# Patient Record
Sex: Female | Born: 1935
Health system: Southern US, Community
[De-identification: ages and names within clinical notes are randomized; demographics above are authoritative.]

## PROBLEM LIST (undated history)

## (undated) DIAGNOSIS — M810 Age-related osteoporosis without current pathological fracture: Secondary | ICD-10-CM

## (undated) DIAGNOSIS — M1990 Unspecified osteoarthritis, unspecified site: Secondary | ICD-10-CM

## (undated) DIAGNOSIS — I10 Essential (primary) hypertension: Secondary | ICD-10-CM

## (undated) DIAGNOSIS — Z960 Presence of urogenital implants: Secondary | ICD-10-CM

## (undated) DIAGNOSIS — F32A Depression, unspecified: Secondary | ICD-10-CM

## (undated) DIAGNOSIS — I509 Heart failure, unspecified: Secondary | ICD-10-CM

## (undated) DIAGNOSIS — I499 Cardiac arrhythmia, unspecified: Secondary | ICD-10-CM

## (undated) DIAGNOSIS — Z8739 Personal history of other diseases of the musculoskeletal system and connective tissue: Secondary | ICD-10-CM

## (undated) DIAGNOSIS — IMO0001 Reserved for inherently not codable concepts without codable children: Secondary | ICD-10-CM

## (undated) DIAGNOSIS — F329 Major depressive disorder, single episode, unspecified: Secondary | ICD-10-CM

## (undated) DIAGNOSIS — M069 Rheumatoid arthritis, unspecified: Secondary | ICD-10-CM

## (undated) DIAGNOSIS — C50919 Malignant neoplasm of unspecified site of unspecified female breast: Secondary | ICD-10-CM

## (undated) DIAGNOSIS — N182 Chronic kidney disease, stage 2 (mild): Secondary | ICD-10-CM

## (undated) DIAGNOSIS — K8020 Calculus of gallbladder without cholecystitis without obstruction: Secondary | ICD-10-CM

## (undated) DIAGNOSIS — I639 Cerebral infarction, unspecified: Secondary | ICD-10-CM

## (undated) DIAGNOSIS — N8189 Other female genital prolapse: Secondary | ICD-10-CM

## (undated) DIAGNOSIS — E119 Type 2 diabetes mellitus without complications: Secondary | ICD-10-CM

## (undated) DIAGNOSIS — G459 Transient cerebral ischemic attack, unspecified: Secondary | ICD-10-CM

## (undated) DIAGNOSIS — I5022 Chronic systolic (congestive) heart failure: Secondary | ICD-10-CM

## (undated) DIAGNOSIS — I4891 Unspecified atrial fibrillation: Secondary | ICD-10-CM

## (undated) DIAGNOSIS — H9190 Unspecified hearing loss, unspecified ear: Secondary | ICD-10-CM

## (undated) DIAGNOSIS — E559 Vitamin D deficiency, unspecified: Secondary | ICD-10-CM

## (undated) HISTORY — DX: Transient cerebral ischemic attack, unspecified: G45.9

## (undated) HISTORY — DX: Type 2 diabetes mellitus without complications: E11.9

## (undated) HISTORY — PX: REPLACEMENT TOTAL KNEE: SUR1224

## (undated) HISTORY — DX: Personal history of other diseases of the musculoskeletal system and connective tissue: Z87.39

## (undated) HISTORY — DX: Chronic kidney disease, stage 2 (mild): N18.2

## (undated) HISTORY — PX: CATARACT EXTRACTION W/ INTRAOCULAR LENS  IMPLANT, BILATERAL: SHX1307

## (undated) HISTORY — DX: Rheumatoid arthritis, unspecified: M06.9

## (undated) HISTORY — DX: Vitamin D deficiency, unspecified: E55.9

## (undated) HISTORY — DX: Presence of urogenital implants: Z96.0

## (undated) HISTORY — DX: Age-related osteoporosis without current pathological fracture: M81.0

## (undated) HISTORY — DX: Major depressive disorder, single episode, unspecified: F32.9

## (undated) HISTORY — PX: BREAST LUMPECTOMY: SHX2

## (undated) HISTORY — DX: Calculus of gallbladder without cholecystitis without obstruction: K80.20

## (undated) HISTORY — DX: Other female genital prolapse: N81.89

---

## 1997-10-21 ENCOUNTER — Other Ambulatory Visit: Admission: RE | Admit: 1997-10-21 | Discharge: 1997-10-21 | Payer: Self-pay | Admitting: Obstetrics and Gynecology

## 1999-03-20 ENCOUNTER — Other Ambulatory Visit: Admission: RE | Admit: 1999-03-20 | Discharge: 1999-03-20 | Payer: Self-pay | Admitting: Obstetrics and Gynecology

## 1999-12-16 ENCOUNTER — Ambulatory Visit (HOSPITAL_COMMUNITY): Admission: RE | Admit: 1999-12-16 | Discharge: 1999-12-16 | Payer: Self-pay | Admitting: *Deleted

## 2000-04-01 ENCOUNTER — Other Ambulatory Visit: Admission: RE | Admit: 2000-04-01 | Discharge: 2000-04-01 | Payer: Self-pay | Admitting: Obstetrics and Gynecology

## 2000-07-22 ENCOUNTER — Encounter: Admission: RE | Admit: 2000-07-22 | Discharge: 2000-07-22 | Payer: Self-pay | Admitting: Rheumatology

## 2000-07-22 ENCOUNTER — Encounter: Payer: Self-pay | Admitting: Rheumatology

## 2001-07-28 ENCOUNTER — Other Ambulatory Visit: Admission: RE | Admit: 2001-07-28 | Discharge: 2001-07-28 | Payer: Self-pay | Admitting: Obstetrics and Gynecology

## 2001-10-01 ENCOUNTER — Encounter: Payer: Self-pay | Admitting: Emergency Medicine

## 2001-10-01 ENCOUNTER — Emergency Department (HOSPITAL_COMMUNITY): Admission: EM | Admit: 2001-10-01 | Discharge: 2001-10-01 | Payer: Self-pay | Admitting: Emergency Medicine

## 2003-06-17 ENCOUNTER — Emergency Department (HOSPITAL_COMMUNITY): Admission: EM | Admit: 2003-06-17 | Discharge: 2003-06-17 | Payer: Self-pay | Admitting: Emergency Medicine

## 2004-09-05 ENCOUNTER — Emergency Department (HOSPITAL_COMMUNITY): Admission: EM | Admit: 2004-09-05 | Discharge: 2004-09-05 | Payer: Self-pay | Admitting: Emergency Medicine

## 2004-11-24 ENCOUNTER — Encounter: Admission: RE | Admit: 2004-11-24 | Discharge: 2004-11-24 | Payer: Self-pay | Admitting: Internal Medicine

## 2004-12-01 ENCOUNTER — Other Ambulatory Visit: Admission: RE | Admit: 2004-12-01 | Discharge: 2004-12-01 | Payer: Self-pay | Admitting: Obstetrics and Gynecology

## 2005-12-07 ENCOUNTER — Other Ambulatory Visit: Admission: RE | Admit: 2005-12-07 | Discharge: 2005-12-07 | Payer: Self-pay | Admitting: Obstetrics and Gynecology

## 2006-03-01 ENCOUNTER — Encounter: Admission: RE | Admit: 2006-03-01 | Discharge: 2006-03-01 | Payer: Self-pay | Admitting: Obstetrics and Gynecology

## 2006-03-07 ENCOUNTER — Encounter: Admission: RE | Admit: 2006-03-07 | Discharge: 2006-03-07 | Payer: Self-pay | Admitting: Obstetrics and Gynecology

## 2006-06-03 ENCOUNTER — Encounter: Admission: RE | Admit: 2006-06-03 | Discharge: 2006-06-03 | Payer: Self-pay | Admitting: Cardiology

## 2006-06-08 IMAGING — CR DG ELBOW COMPLETE 3+V*L*
4 series · 4 of 4 positions shown · non-contrast
Comparison: None.

CLINICAL DATA: Status post fall now with large swollen and bruised area around the elbow.  
 LEFT ELBOW ? 3 VIEWS:

[w elbow joint lat left *]
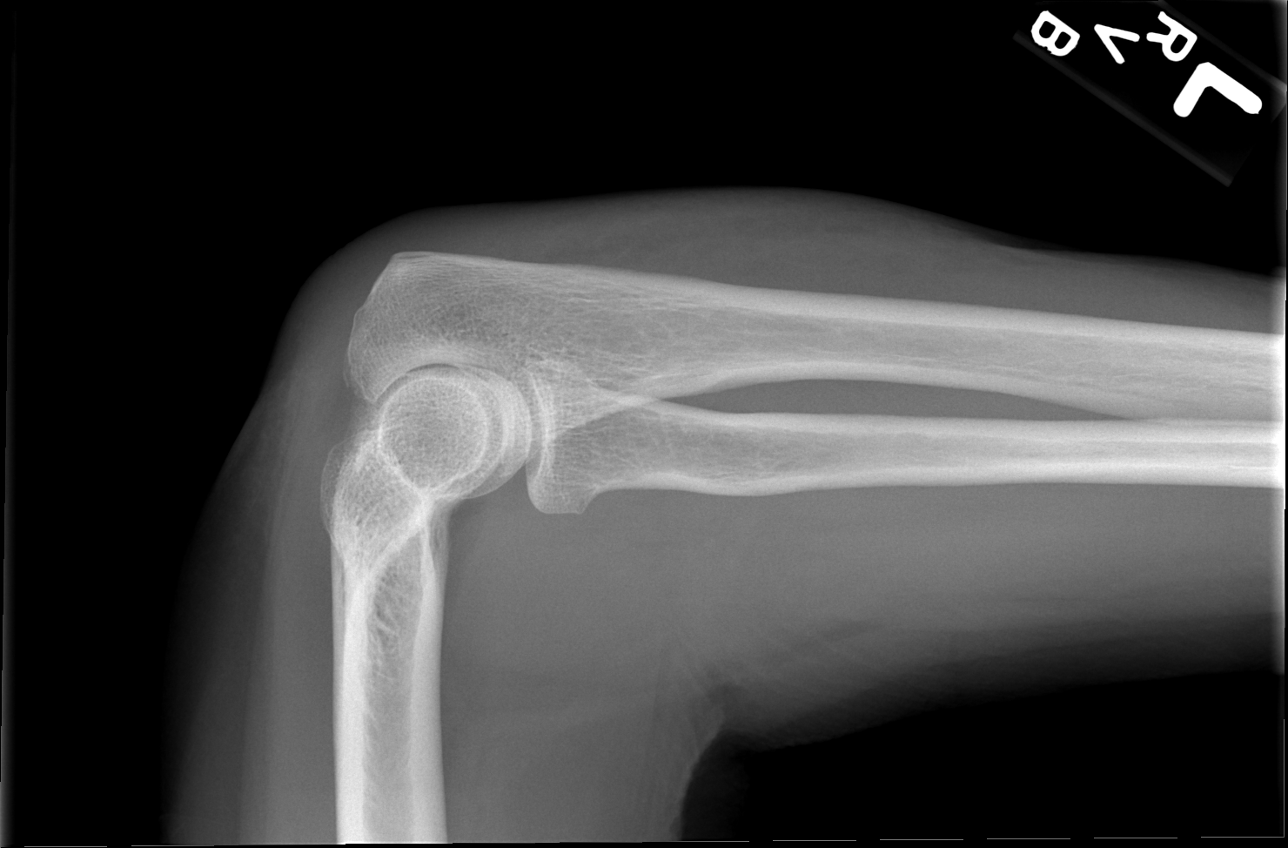

[w elbow joint a.p. left]
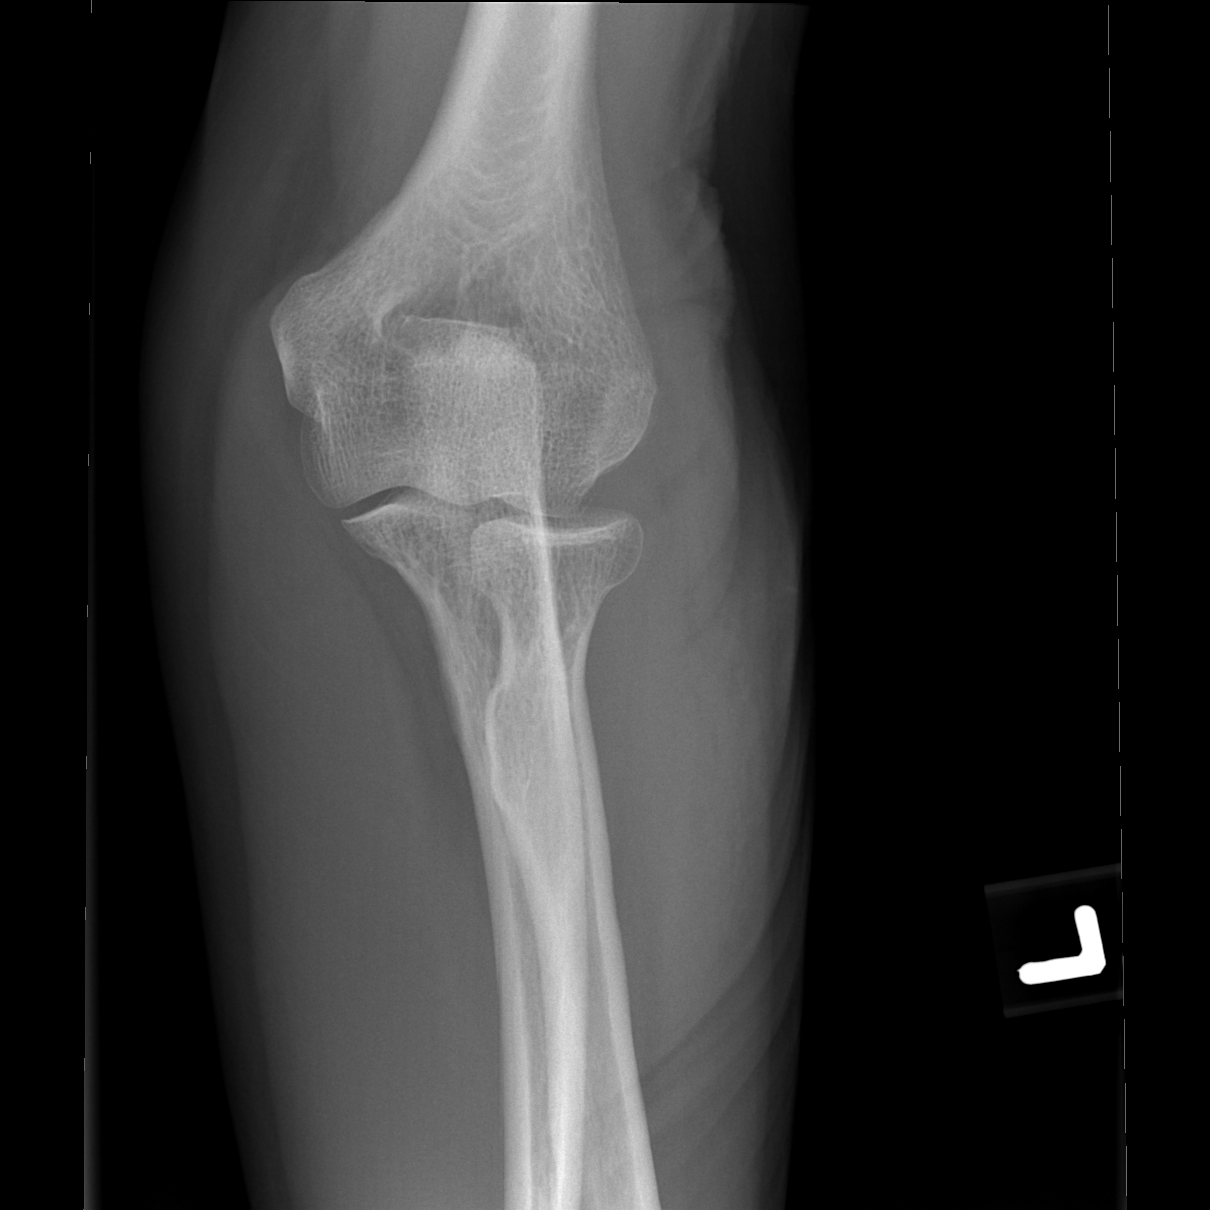

[w elbow joint obl left (1 of 2)]
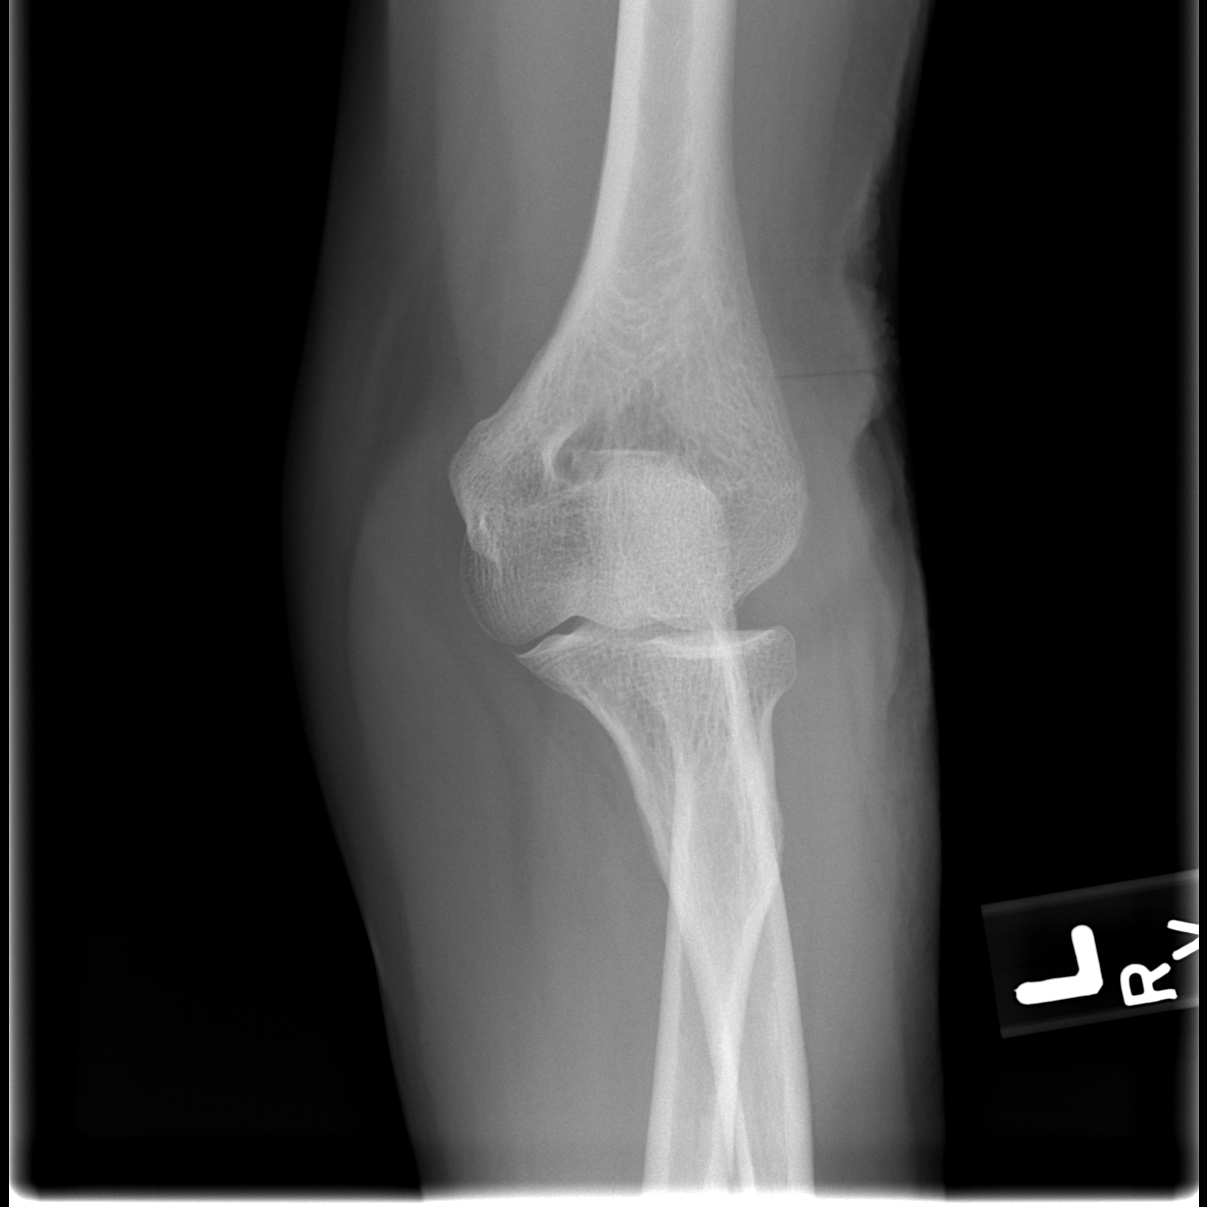

[w elbow joint obl left (2 of 2)]
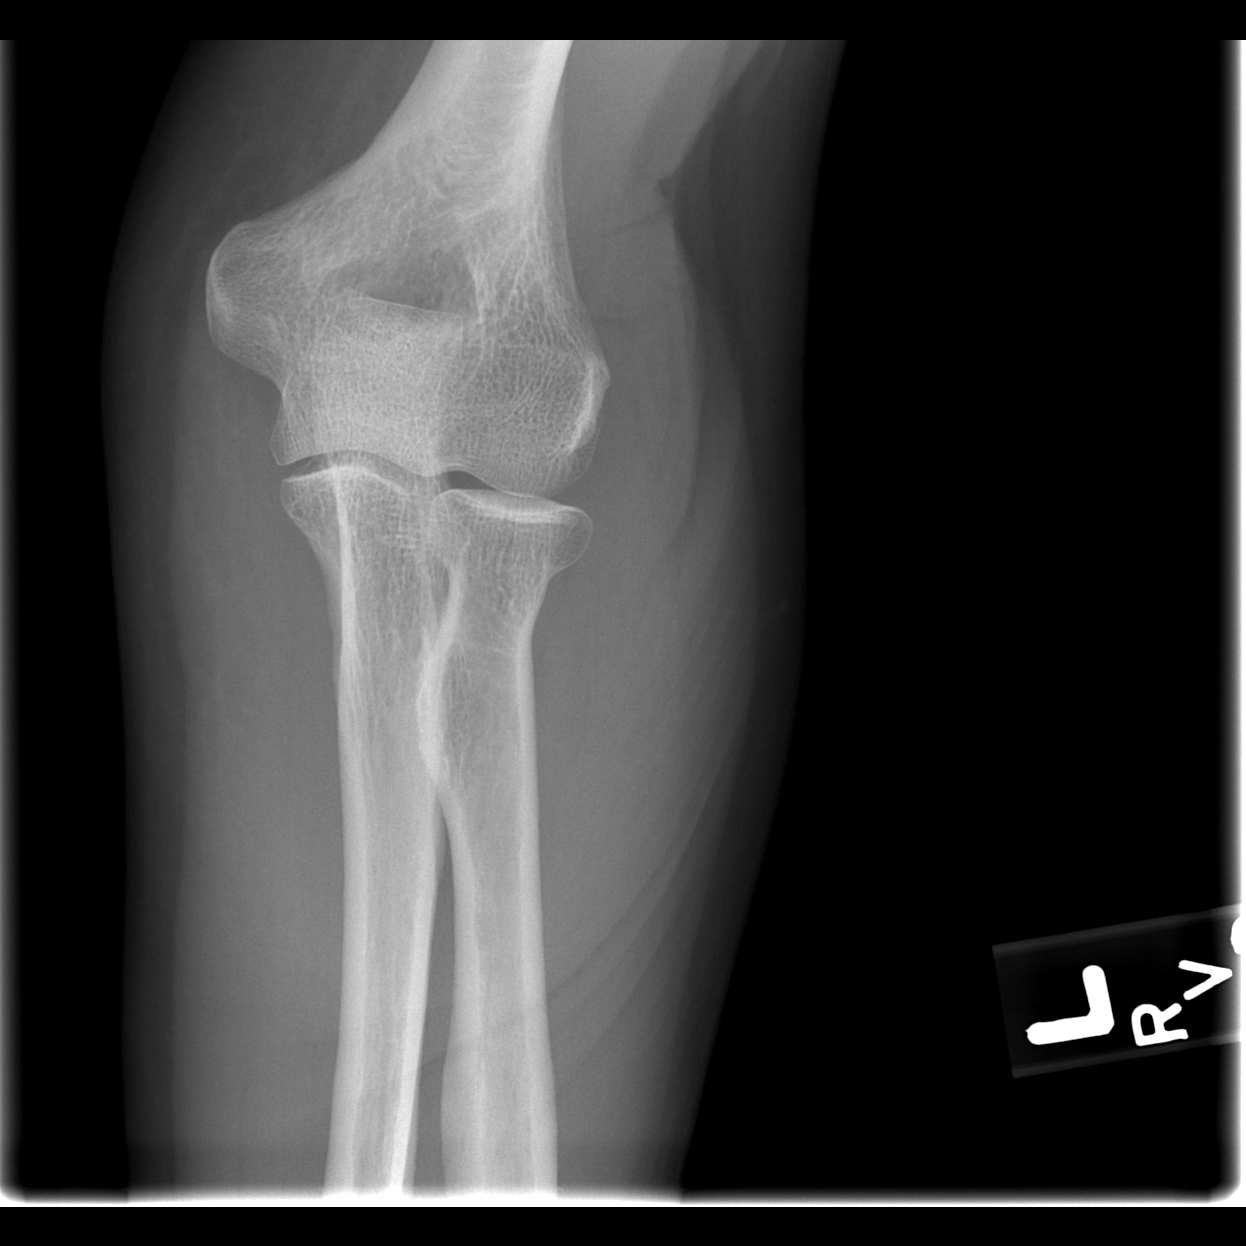

[4 of 4 positions shown; findings below may reference images not displayed]

FINDINGS: There is no joint effusion.  No underlying fracture or dislocation is identified.
IMPRESSION: No evidence for a fracture or dislocation.

## 2006-10-03 ENCOUNTER — Encounter: Admission: RE | Admit: 2006-10-03 | Discharge: 2006-10-03 | Payer: Self-pay | Admitting: Obstetrics and Gynecology

## 2006-12-26 ENCOUNTER — Encounter: Admission: RE | Admit: 2006-12-26 | Discharge: 2006-12-26 | Payer: Self-pay | Admitting: Obstetrics and Gynecology

## 2007-02-13 ENCOUNTER — Encounter: Admission: RE | Admit: 2007-02-13 | Discharge: 2007-02-13 | Payer: Self-pay | Admitting: Obstetrics and Gynecology

## 2007-03-03 ENCOUNTER — Encounter: Admission: RE | Admit: 2007-03-03 | Discharge: 2007-03-03 | Payer: Self-pay | Admitting: Obstetrics and Gynecology

## 2007-11-28 DIAGNOSIS — M129 Arthropathy, unspecified: Secondary | ICD-10-CM | POA: Insufficient documentation

## 2009-03-05 ENCOUNTER — Ambulatory Visit (HOSPITAL_BASED_OUTPATIENT_CLINIC_OR_DEPARTMENT_OTHER): Admission: RE | Admit: 2009-03-05 | Discharge: 2009-03-05 | Payer: Self-pay | Admitting: Orthopedic Surgery

## 2009-12-30 ENCOUNTER — Encounter: Admission: RE | Admit: 2009-12-30 | Discharge: 2009-12-30 | Payer: Self-pay | Admitting: Obstetrics and Gynecology

## 2010-01-02 ENCOUNTER — Encounter: Admission: RE | Admit: 2010-01-02 | Discharge: 2010-01-02 | Payer: Self-pay | Admitting: Obstetrics and Gynecology

## 2010-09-25 LAB — BASIC METABOLIC PANEL
BUN: 6 mg/dL (ref 6–23)
Chloride: 103 mEq/L (ref 96–112)
Potassium: 4.4 mEq/L (ref 3.5–5.1)

## 2010-09-25 LAB — POCT HEMOGLOBIN-HEMACUE: Hemoglobin: 14.9 g/dL (ref 12.0–15.0)

## 2011-03-17 DIAGNOSIS — F419 Anxiety disorder, unspecified: Secondary | ICD-10-CM | POA: Insufficient documentation

## 2011-03-17 DIAGNOSIS — I480 Paroxysmal atrial fibrillation: Secondary | ICD-10-CM | POA: Insufficient documentation

## 2011-07-05 ENCOUNTER — Other Ambulatory Visit: Payer: Self-pay

## 2011-07-05 ENCOUNTER — Emergency Department (HOSPITAL_BASED_OUTPATIENT_CLINIC_OR_DEPARTMENT_OTHER)
Admission: EM | Admit: 2011-07-05 | Discharge: 2011-07-05 | Disposition: A | Discharge status: Other Acute Inpatient Hospital | Payer: Medicare Other | Attending: Emergency Medicine | Admitting: Emergency Medicine

## 2011-07-05 ENCOUNTER — Encounter (HOSPITAL_BASED_OUTPATIENT_CLINIC_OR_DEPARTMENT_OTHER): Payer: Self-pay | Admitting: *Deleted

## 2011-07-05 ENCOUNTER — Emergency Department (INDEPENDENT_AMBULATORY_CARE_PROVIDER_SITE_OTHER): Payer: Medicare Other

## 2011-07-05 DIAGNOSIS — R079 Chest pain, unspecified: Secondary | ICD-10-CM

## 2011-07-05 DIAGNOSIS — W19XXXA Unspecified fall, initial encounter: Secondary | ICD-10-CM

## 2011-07-05 DIAGNOSIS — R404 Transient alteration of awareness: Secondary | ICD-10-CM

## 2011-07-05 DIAGNOSIS — G319 Degenerative disease of nervous system, unspecified: Secondary | ICD-10-CM | POA: Insufficient documentation

## 2011-07-05 DIAGNOSIS — Z79899 Other long term (current) drug therapy: Secondary | ICD-10-CM | POA: Insufficient documentation

## 2011-07-05 DIAGNOSIS — R55 Syncope and collapse: Secondary | ICD-10-CM | POA: Insufficient documentation

## 2011-07-05 DIAGNOSIS — R61 Generalized hyperhidrosis: Secondary | ICD-10-CM | POA: Insufficient documentation

## 2011-07-05 DIAGNOSIS — Y92009 Unspecified place in unspecified non-institutional (private) residence as the place of occurrence of the external cause: Secondary | ICD-10-CM | POA: Insufficient documentation

## 2011-07-05 DIAGNOSIS — W108XXA Fall (on) (from) other stairs and steps, initial encounter: Secondary | ICD-10-CM | POA: Insufficient documentation

## 2011-07-05 DIAGNOSIS — I4891 Unspecified atrial fibrillation: Secondary | ICD-10-CM | POA: Insufficient documentation

## 2011-07-05 DIAGNOSIS — I10 Essential (primary) hypertension: Secondary | ICD-10-CM | POA: Insufficient documentation

## 2011-07-05 HISTORY — DX: Unspecified atrial fibrillation: I48.91

## 2011-07-05 HISTORY — DX: Essential (primary) hypertension: I10

## 2011-07-05 LAB — DIFFERENTIAL
Basophils Absolute: 0 10*3/uL (ref 0.0–0.1)
Eosinophils Absolute: 0 10*3/uL (ref 0.0–0.7)
Eosinophils Relative: 1 % (ref 0–5)
Lymphocytes Relative: 20 % (ref 12–46)
Lymphs Abs: 1.5 10*3/uL (ref 0.7–4.0)
Neutrophils Relative %: 71 % (ref 43–77)

## 2011-07-05 LAB — COMPREHENSIVE METABOLIC PANEL
ALT: 15 U/L (ref 0–35)
AST: 19 U/L (ref 0–37)
Alkaline Phosphatase: 51 U/L (ref 39–117)
Calcium: 10 mg/dL (ref 8.4–10.5)
Glucose, Bld: 104 mg/dL — ABNORMAL HIGH (ref 70–99)
Potassium: 4.3 mEq/L (ref 3.5–5.1)
Sodium: 140 mEq/L (ref 135–145)
Total Protein: 7.2 g/dL (ref 6.0–8.3)

## 2011-07-05 LAB — URINALYSIS, ROUTINE W REFLEX MICROSCOPIC
Bilirubin Urine: NEGATIVE
Glucose, UA: NEGATIVE mg/dL
Ketones, ur: NEGATIVE mg/dL
Nitrite: NEGATIVE
Specific Gravity, Urine: 1.013 (ref 1.005–1.030)
pH: 8 (ref 5.0–8.0)

## 2011-07-05 LAB — CBC
MCH: 31.2 pg (ref 26.0–34.0)
MCV: 90.6 fL (ref 78.0–100.0)
Platelets: 199 10*3/uL (ref 150–400)
RBC: 5 MIL/uL (ref 3.87–5.11)
RDW: 13.4 % (ref 11.5–15.5)
WBC: 7.7 10*3/uL (ref 4.0–10.5)

## 2011-07-05 LAB — CARDIAC PANEL(CRET KIN+CKTOT+MB+TROPI)
CK, MB: 2.7 ng/mL (ref 0.3–4.0)
Relative Index: INVALID (ref 0.0–2.5)
Troponin I: <0.3 ng/mL (ref <0.30)

## 2011-07-05 LAB — URINE MICROSCOPIC-ADD ON

## 2011-07-05 MED ORDER — CEFTRIAXONE SODIUM 1 G IJ SOLR
1.0000 g | Freq: Once | INTRAMUSCULAR | Status: AC
  Administered 2011-07-05: 1 g via INTRAVENOUS
  Filled 2011-07-05: qty 10

## 2011-07-05 NOTE — ED Provider Notes (Signed)
History     CSN: [id number]  Arrival date & time 07/05/11  1139   First MD Initiated Contact with Patient 07/05/11 1217      Chief Complaint  Patient presents with  . Chest Pain  . Fall  . Loss of Consciousness    (Consider location/radiation/quality/duration/timing/severity/associated sxs/prior treatment) HPI  Patient felt weak and had indigestion on awakening this a.m.  Patient felt like something hit her heart.  She took zantac due to chest pain without relief.  Patient was doing housework and became very light headed on landing and awoke at bottom of steps.  Very diaphoretic on awakening.  Patient able to get up and called son.  Daughter brought in to ed.  Daughter called cardiologist at [facility].  Denies chest pain at present.  It resolved after son came over.  Cardiologist is Dr. Theophilus Bones.    Past Medical History  Diagnosis Date  . Hypertension     History reviewed. No pertinent past surgical history.  History reviewed. No pertinent family history.  History  Substance Use Topics  . Smoking status: Never Smoker   . Smokeless tobacco: Not on file  . Alcohol Use: No    OB History    Grav Para Term Preterm Abortions TAB SAB Ect Mult Living                  Review of Systems  All other systems reviewed and are negative.    Allergies  Review of patient's allergies indicates not on file.  Home Medications   Current Outpatient Rx  Name Route Sig Dispense Refill  . AMLODIPINE BESYLATE 10 MG PO TABS Oral Take 10 mg by mouth daily.    Marland Kitchen BISOPROLOL FUMARATE 5 MG PO TABS Oral Take 5 mg by mouth daily.    . DULOXETINE HCL 30 MG PO CPEP Oral Take 30 mg by mouth daily.    Marland Kitchen VALSARTAN 320 MG PO TABS Oral Take 320 mg by mouth daily.      BP 120/76  Pulse 81  Temp(Src) 98.3 F (36.8 C) (Oral)  Resp 20  SpO2 97%  Physical Exam  Nursing note and vitals reviewed. Constitutional: She is oriented to person, place, and time. She appears well-developed and well-nourished.   HENT:  Head: Normocephalic and atraumatic.  Right Ear: External ear normal.  Left Ear: External ear normal.  Nose: Nose normal.  Mouth/Throat: Oropharynx is clear and moist.  Eyes: Conjunctivae and EOM are normal. Pupils are equal, round, and reactive to light.  Neck: Normal range of motion. Neck supple.  Cardiovascular: An irregularly irregular rhythm present.  Pulmonary/Chest: Effort normal and breath sounds normal.  Abdominal: Soft. Bowel sounds are normal.  Musculoskeletal: Normal range of motion.  Neurological: She is alert and oriented to person, place, and time. She has normal reflexes.  Skin: Skin is warm and dry.  Psychiatric: She has a normal mood and affect. Her behavior is normal. Judgment and thought content normal.    ED Course  Procedures (including critical care time)  Labs Reviewed - No data to display No results found.   No diagnosis found. Results for orders placed during the hospital encounter of 07/05/11  CBC      Component Value Range   WBC 7.7  4.0 - 10.5 (K/uL)   RBC 5.00  3.87 - 5.11 (MIL/uL)   Hemoglobin 15.6 (*) 12.0 - 15.0 (g/dL)   HCT 78.2  95.6 - 21.3 (%)   MCV 90.6  78.0 - 100.0 (  fL)   MCH 31.2  26.0 - 34.0 (pg)   MCHC 34.4  30.0 - 36.0 (g/dL)   RDW 16.1  09.6 - 04.5 (%)   Platelets 199  150 - 400 (K/uL)  DIFFERENTIAL      Component Value Range   Neutrophils Relative 71  43 - 77 (%)   Neutro Abs 5.5  1.7 - 7.7 (K/uL)   Lymphocytes Relative 20  12 - 46 (%)   Lymphs Abs 1.5  0.7 - 4.0 (K/uL)   Monocytes Relative 8  3 - 12 (%)   Monocytes Absolute 0.6  0.1 - 1.0 (K/uL)   Eosinophils Relative 1  0 - 5 (%)   Eosinophils Absolute 0.0  0.0 - 0.7 (K/uL)   Basophils Relative 0  0 - 1 (%)   Basophils Absolute 0.0  0.0 - 0.1 (K/uL)  COMPREHENSIVE METABOLIC PANEL      Component Value Range   Sodium 140  135 - 145 (mEq/L)   Potassium 4.3  3.5 - 5.1 (mEq/L)   Chloride 105  96 - 112 (mEq/L)   CO2 25  19 - 32 (mEq/L)   Glucose, Bld 104 (*) 70 -  99 (mg/dL)   BUN 11  6 - 23 (mg/dL)   Creatinine, Ser 4.09  0.50 - 1.10 (mg/dL)   Calcium 81.1  8.4 - 10.5 (mg/dL)   Total Protein 7.2  6.0 - 8.3 (g/dL)   Albumin 4.2  3.5 - 5.2 (g/dL)   AST 19  0 - 37 (U/L)   ALT 15  0 - 35 (U/L)   Alkaline Phosphatase 51  39 - 117 (U/L)   Total Bilirubin 1.4 (*) 0.3 - 1.2 (mg/dL)   GFR calc non Af Amer 86 (*) >90 (mL/min)   GFR calc Af Amer >90  >90 (mL/min)  URINALYSIS, ROUTINE W REFLEX MICROSCOPIC      Component Value Range   Color, Urine YELLOW  YELLOW    APPearance CLOUDY (*) CLEAR    Specific Gravity, Urine 1.013  1.005 - 1.030    pH 8.0  5.0 - 8.0    Glucose, UA NEGATIVE  NEGATIVE (mg/dL)   Hgb urine dipstick SMALL (*) NEGATIVE    Bilirubin Urine NEGATIVE  NEGATIVE    Ketones, ur NEGATIVE  NEGATIVE (mg/dL)   Protein, ur NEGATIVE  NEGATIVE (mg/dL)   Urobilinogen, UA 0.2  0.0 - 1.0 (mg/dL)   Nitrite NEGATIVE  NEGATIVE    Leukocytes, UA LARGE (*) NEGATIVE   CARDIAC PANEL(CRET KIN+CKTOT+MB+TROPI)      Component Value Range   Total CK 91  7 - 177 (U/L)   CK, MB 2.7  0.3 - 4.0 (ng/mL)   Troponin I <0.30  <0.30 (ng/mL)   Relative Index RELATIVE INDEX IS INVALID  0.0 - 2.5   URINE MICROSCOPIC-ADD ON      Component Value Range   Squamous Epithelial / LPF FEW (*) RARE    WBC, UA 21-50  <3 (WBC/hpf)   RBC / HPF 7-10  <3 (RBC/hpf)   Bacteria, UA MANY (*) RARE    Urine-Other MUCOUS PRESENT      Dg Chest 2 View  07/05/2011  *RADIOLOGY REPORT*  Clinical Data: Chest pain syncopal episode.  History of hypertension and atrial fibrillation.  CHEST - 2 VIEW  Comparison: 09/05/2004  Findings: Artifact overlies chest.  Heart size is normal. Mediastinal shadows are normal.  The lungs are clear.  No effusions.  No significant bony finding.  IMPRESSION: No active disease  Original Report Authenticated By: Thomasenia Sales, M.D.   Ct Head Wo Contrast  07/05/2011  *RADIOLOGY REPORT*  Clinical Data: Loss of consciousness with fall.  CT HEAD WITHOUT CONTRAST   Technique:  Contiguous axial images were obtained from the base of the skull through the vertex without contrast.  Comparison: 06/03/2006  Findings: The brain shows mild generalized age related atrophy.  No sign of acute small or large vessel infarction, mass lesion, hemorrhage, hydrocephalus or extra-axial collection.  No skull fracture.  Sinuses, middle ears and mastoids are clear.  IMPRESSION: No acute or significant finding.  Mild age related atrophy.  Original Report Authenticated By: Thomasenia Sales, M.D.    MDM  Patient has had intermittent atrial fibrillation seen on EKG with normal sinus rhythm also seen on monitor. Should not any signs of acute ischemia on her EKG and her cardiac markers are normal here in the emergency department. She has been asymptomatic from chest pain since she has been here. She does appear to have urinary tract infection was treated with Rocephin. Her urine has been cultured. She will require further assessment for her chest pain followed by syncope. I've been attempting to contact her cardiologist at [facility], Dr. Theophilus Bones. The patient and her daughter have both been informed and are aware of the current situation.        Hilario Quarry, MD 07/05/11 [phone]

## 2011-07-05 NOTE — ED Notes (Signed)
Patient is resting comfortably. 

## 2011-07-05 NOTE — ED Notes (Signed)
Pt report given to Tomma Lightning, Charity fundraiser at [place]. Pt made aware of bed assignment and wait for pt transport.

## 2011-07-05 NOTE — ED Notes (Signed)
Pt aware that we are waiting for bed to open up at [facility] her daughter , Misty Stanley has gone  Home to pack a bag and may be reached at [phone] if needed before she returns. Pt eating a lean cuisine

## 2011-07-05 NOTE — ED Notes (Signed)
Call placed again to [facility] regarding pt's bed status. Per charge nurse at [facility], they are still waiting for some discharges before beds will be available. Pt made aware.

## 2011-07-05 NOTE — ED Provider Notes (Signed)
Patient remained stable while in the ED. Patient was eventually accepted by [facility] cardiology. She'll be transferred for further management.  Cyndra Numbers, MD 07/05/11 2115

## 2011-07-05 NOTE — ED Notes (Signed)
Pt report given to Jason, RN with [facility]. 

## 2011-07-05 NOTE — ED Notes (Signed)
Dr. Theophilus Bones has been paged twice since 1435 on 07/05/11.  Dr. Rosalia Hammers is still awaiting the return call, to [phone]

## 2011-07-05 NOTE — ED Notes (Signed)
Natalie Mendoza  Charge rn at [facility] stepdown unit called back stating still no available beds but are expecting discharges tonight. They will call to notify us of bed assignment as soon as possible. Explained this to patient and daughter. Patient wants to take her home daily meds if and when she can eat "real food" will talk to MD about this

## 2011-07-05 NOTE — ED Notes (Signed)
[facility] here for pt transport, pt continues to deny CP or SOB. IV SL, IV site unremarkable.

## 2011-07-05 NOTE — ED Notes (Signed)
Pt and daughter asking about the wait explained to them that dr ray has placed calls to [facility] and dr granger and she is awaiting a return call from them to consult with them and determine where she needs to be admitted and observed tonight. Warm blankets provided both patient and daughter verbalize understanding

## 2011-07-05 NOTE — ED Notes (Signed)
Family at bedside. 

## 2011-07-05 NOTE — ED Notes (Signed)
Had chest pain this morning was getting a step stool out and bent over then fainted and fell backward pt states doesn't have any pain at present

## 2011-07-05 NOTE — ED Notes (Signed)
Patient transported to CT 

## 2011-11-26 ENCOUNTER — Emergency Department (HOSPITAL_BASED_OUTPATIENT_CLINIC_OR_DEPARTMENT_OTHER)
Admission: EM | Admit: 2011-11-26 | Discharge: 2011-11-26 | Disposition: A | Discharge status: Home / Self Care | Payer: Medicare Other | Attending: Emergency Medicine | Admitting: Emergency Medicine

## 2011-11-26 ENCOUNTER — Emergency Department (HOSPITAL_BASED_OUTPATIENT_CLINIC_OR_DEPARTMENT_OTHER): Payer: Medicare Other

## 2011-11-26 ENCOUNTER — Encounter (HOSPITAL_BASED_OUTPATIENT_CLINIC_OR_DEPARTMENT_OTHER): Payer: Self-pay | Admitting: *Deleted

## 2011-11-26 DIAGNOSIS — IMO0002 Reserved for concepts with insufficient information to code with codable children: Secondary | ICD-10-CM | POA: Insufficient documentation

## 2011-11-26 DIAGNOSIS — Z79899 Other long term (current) drug therapy: Secondary | ICD-10-CM | POA: Insufficient documentation

## 2011-11-26 DIAGNOSIS — S0990XA Unspecified injury of head, initial encounter: Secondary | ICD-10-CM

## 2011-11-26 DIAGNOSIS — M810 Age-related osteoporosis without current pathological fracture: Secondary | ICD-10-CM | POA: Insufficient documentation

## 2011-11-26 DIAGNOSIS — W010XXA Fall on same level from slipping, tripping and stumbling without subsequent striking against object, initial encounter: Secondary | ICD-10-CM | POA: Insufficient documentation

## 2011-11-26 DIAGNOSIS — I10 Essential (primary) hypertension: Secondary | ICD-10-CM | POA: Insufficient documentation

## 2011-11-26 DIAGNOSIS — T07XXXA Unspecified multiple injuries, initial encounter: Secondary | ICD-10-CM

## 2011-11-26 DIAGNOSIS — K089 Disorder of teeth and supporting structures, unspecified: Secondary | ICD-10-CM | POA: Insufficient documentation

## 2011-11-26 DIAGNOSIS — M25569 Pain in unspecified knee: Secondary | ICD-10-CM | POA: Insufficient documentation

## 2011-11-26 DIAGNOSIS — I4891 Unspecified atrial fibrillation: Secondary | ICD-10-CM | POA: Insufficient documentation

## 2011-11-26 DIAGNOSIS — S8992XA Unspecified injury of left lower leg, initial encounter: Secondary | ICD-10-CM

## 2011-11-26 DIAGNOSIS — S0993XA Unspecified injury of face, initial encounter: Secondary | ICD-10-CM

## 2011-11-26 DIAGNOSIS — Z7982 Long term (current) use of aspirin: Secondary | ICD-10-CM | POA: Insufficient documentation

## 2011-11-26 MED ORDER — TRAMADOL HCL 50 MG PO TABS: 50.0000 mg | ORAL_TABLET | Freq: Four times a day (QID) | ORAL | Status: DC | PRN

## 2011-11-26 MED ORDER — HYDROCODONE-ACETAMINOPHEN 5-500 MG PO TABS: 1.0000 | ORAL_TABLET | Freq: Four times a day (QID) | ORAL | Status: AC | PRN

## 2011-11-26 MED ORDER — TRAMADOL HCL 50 MG PO TABS: 50.0000 mg | ORAL_TABLET | Freq: Four times a day (QID) | ORAL | Status: AC | PRN

## 2011-11-26 MED ORDER — TRAMADOL HCL 50 MG PO TABS
50.0000 mg | ORAL_TABLET | Freq: Once | ORAL | Status: AC
  Administered 2011-11-26: 50 mg via ORAL
  Filled 2011-11-26: qty 1

## 2011-11-26 NOTE — ED Notes (Signed)
Fall. Hit her face on the cement driveway. Swelling to her nose, right cheek and upper lip. Abrasions noted. Bleeding controlled.

## 2011-11-26 NOTE — Discharge Instructions (Signed)
Head Injury, Adult You have had a head injury that does not appear serious at this time. A concussion is a state of changed mental ability, usually from a blow to the head. You should take clear liquids for the rest of the day and then resume your regular diet. You should not take sedatives or alcoholic beverages for as long as directed by your caregiver after discharge. After injuries such as yours, most problems occur within the first 24 hours. SYMPTOMS These minor symptoms may be experienced after discharge:  Memory difficulties.   Dizziness.   Headaches.   Double vision.   Hearing difficulties.   Depression.   Tiredness.   Weakness.   Difficulty with concentration.  If you experience any of these problems, you should not be alarmed. A concussion requires a few days for recovery. Many patients with head injuries frequently experience such symptoms. Usually, these problems disappear without medical care. If symptoms last for more than one day, notify your caregiver. See your caregiver sooner if symptoms are becoming worse rather than better. HOME CARE INSTRUCTIONS   During the next 24 hours you must stay with someone who can watch you for the warning signs listed below.  Although it is unlikely that serious side effects will occur, you should be aware of signs and symptoms which may necessitate your return to this location. Side effects may occur up to 7 - 10 days following the injury. It is important for you to carefully monitor your condition and contact your caregiver or seek immediate medical attention if there is a change in your condition. SEEK IMMEDIATE MEDICAL CARE IF:   There is confusion or drowsiness.   You can not awaken the injured person.   There is nausea (feeling sick to your stomach) or continued, forceful vomiting.   You notice dizziness or unsteadiness which is getting worse, or inability to walk.   You have convulsions or unconsciousness.   You experience  severe, persistent headaches not relieved by over-the-counter or prescription medicines for pain. (Do not take aspirin as this impairs clotting abilities). Take other pain medications only as directed.   You can not use arms or legs normally.   There is clear or bloody discharge from the nose or ears.  MAKE SURE YOU:   Understand these instructions.   Will watch your condition.   Will get help right away if you are not doing well or get worse.  Document Released: 06/07/2005 Document Revised: 05/27/2011 Document Reviewed: 04/25/2009 [facility] Patient Information 2012 [place], [place].Abrasions Abrasions are skin scrapes. Their treatment depends on how large and deep the abrasion is. Abrasions do not extend through all layers of the skin. A cut or lesion through all skin layers is called a laceration. HOME CARE INSTRUCTIONS   If you were given a dressing, change it at least once a day or as instructed by your caregiver. If the bandage sticks, soak it off with a solution of water or hydrogen peroxide.   Twice a day, wash the area with soap and water to remove all the cream/ointment. You may do this in a sink, under a tub faucet, or in a shower. Rinse off the soap and pat dry with a clean towel. Look for signs of infection (see below).   Reapply cream/ointment according to your caregiver's instruction. This will help prevent infection and keep the bandage from sticking. Telfa or gauze over the wound and under the dressing or wrap will also help keep the bandage from sticking.  If the bandage becomes wet, dirty, or develops a foul smell, change it as soon as possible.   Only take over-the-counter or prescription medicines for pain, discomfort, or fever as directed by your caregiver.  SEEK IMMEDIATE MEDICAL CARE IF:   Increasing pain in the wound.   Signs of infection develop: redness, swelling, surrounding area is tender to touch, or pus coming from the wound.   You have a fever.   Any  foul smell coming from the wound or dressing.  Most skin wounds heal within ten days. Facial wounds heal faster. However, an infection may occur despite proper treatment. You should have the wound checked for signs of infection within 24 to 48 hours or sooner if problems arise. If you were not given a wound-check appointment, look closely at the wound yourself on the second day for early signs of infection listed above. MAKE SURE YOU:   Understand these instructions.   Will watch your condition.   Will get help right away if you are not doing well or get worse.  Document Released: 03/17/2005 Document Revised: 05/27/2011 Document Reviewed: 05/11/2011 [facility] Patient Information 2012 [place], [place].Knee Effusion The medical term for having fluid in your knee is effusion. This is often due to an internal derangement of the knee. This means something is wrong inside the knee. Some of the causes of fluid in the knee may be torn cartilage, a torn ligament, or bleeding into the joint from an injury. Your knee is likely more difficult to bend and move. This is often because there is increased pain and pressure in the joint. The time it takes for recovery from a knee effusion depends on different factors, including:   Type of injury.   Your age.   Physical and medical conditions.   Rehabilitation Strategies.  How long you will be away from your normal activities will depend on what kind of knee problem you have and how much damage is present. Your knee has two types of cartilage. Articular cartilage covers the bone ends and lets your knee bend and move smoothly. Two menisci, thick pads of cartilage that form a rim inside the joint, help absorb shock and stabilize your knee. Ligaments bind the bones together and support your knee joint. Muscles move the joint, help support your knee, and take stress off the joint itself. CAUSES  Often an effusion in the knee is caused by an injury to one of the  menisci. This is often a tear in the cartilage. Recovery after a meniscus injury depends on how much meniscus is damaged and whether you have damaged other knee tissue. Small tears may heal on their own with conservative treatment. Conservative means rest, limited weight bearing activity and muscle strengthening exercises. Your recovery may take up to 6 weeks.  TREATMENT  Larger tears may require surgery. Meniscus injuries may be treated during arthroscopy. Arthroscopy is a procedure in which your surgeon uses a small telescope like instrument to look in your knee. Your caregiver can make a more accurate diagnosis (learning what is wrong) by performing an arthroscopic procedure. If your injury is on the inner margin of the meniscus, your surgeon may trim the meniscus back to a smooth rim. In other cases your surgeon will try to repair a damaged meniscus with stitches (sutures). This may make rehabilitation take longer, but may provide better long term result by helping your knee keep its shock absorption capabilities. Ligaments which are completely torn usually require surgery for repair. HOME CARE  INSTRUCTIONS  Use crutches as instructed.   If a brace is applied, use as directed.   Once you are home, an ice pack applied to your swollen knee may help with discomfort and help decrease swelling.   Keep your knee raised (elevated) when you are not up and around or on crutches.   Only take over-the-counter or prescription medicines for pain, discomfort, or fever as directed by your caregiver.   Your caregivers will help with instructions for rehabilitation of your knee. This often includes strengthening exercises.   You may resume a normal diet and activities as directed.  SEEK MEDICAL CARE IF:   There is increased swelling in your knee.   You notice redness, swelling, or increasing pain in your knee.   An unexplained oral temperature above 102 F (38.9 C) develops.  SEEK IMMEDIATE MEDICAL  CARE IF:   You develop a rash.   You have difficulty breathing.   You have any allergic reactions from medications you may have been given.   There is severe pain with any motion of the knee.  MAKE SURE YOU:   Understand these instructions.   Will watch your condition.   Will get help right away if you are not doing well or get worse.  Document Released: 08/28/2003 Document Revised: 05/27/2011 Document Reviewed: 11/01/2007 [facility] Patient Information 2012 [place], [place].Mouth Injury Cuts and scrapes inside the mouth are common from falls or bites. They tend to bleed a lot. Most mouth injuries heal quickly.  HOME CARE  See your dentist right away if teeth are broken. Take all broken pieces with you to the dentist.   Press on the bleeding site with a germ free (sterile) gauze or piece of clean cloth. This will help stop the bleeding.   Cold drinks or ice will help keep the puffiness (swelling) down.   Gargle with warm salt water after 1 day. Put 1 teaspoon of salt into 1 cup of warm water.   Only take medicine as told by your doctor.   Eat soft foods until healing is complete.   Avoid any salty or citrus foods. They may sting your mouth.   Rinse your mouth with warm water after meals.  GET HELP RIGHT AWAY IF:   You have a large amount of bleeding that will not stop.   You have severe pain.   You have trouble swallowing.   Your mouth becomes infected.   You have a fever.  MAKE SURE YOU:   Understand these instructions.   Will watch your condition.   Will get help right away if you are not doing well or get worse.  Document Released: 09/01/2009 Document Revised: 05/27/2011 Document Reviewed: 09/01/2009 [facility] Patient Information 2012 [place], [place].

## 2011-11-27 NOTE — ED Provider Notes (Signed)
History     CSN: [id number]  Arrival date & time 11/26/11  1845   First MD Initiated Contact with Patient 11/26/11 1901      Chief Complaint  Patient presents with  . Fall    (Consider location/radiation/quality/duration/timing/severity/associated sxs/prior treatment) Patient is a 78 y.o. female presenting with fall.  Fall   Patient is a pleasant 78 year old female who presents today after having a mechanical fall when she slipped on wet grass and struck her upper lip and face as well as her left knee on concrete. Patient has abrasion over the left upper lip as well as some slight loosening of her right front tooth. Patient's tetanus vaccination has occurred within the past 10 years. Patient takes aspirin but is on no other blood thinners. Patient denies any loss of consciousness or headache at this time.  She has a history of osteoporosis but denies any neck pain, numbness, tingling, weakness, or incontinence since her fall. Patient complains of left knee pain but has been able to ambulate. She has no other injuries today. Patient attempted to see her dentist but was told that they were closed and was referred to the emergency department. Pain is worse with palpation. Patient has had some improvement in symptoms with ice. There are no other associated or modifying factors. Past Medical History  Diagnosis Date  . Hypertension   . Atrial fibrillation     History reviewed. No pertinent past surgical history.  No family history on file.  History  Substance Use Topics  . Smoking status: Never Smoker   . Smokeless tobacco: Not on file  . Alcohol Use: No    OB History    Grav Para Term Preterm Abortions TAB SAB Ect Mult Living                  Review of Systems  Constitutional: Negative.   HENT: Positive for facial swelling.   Eyes: Negative.   Respiratory: Negative.   Cardiovascular: Negative.   Gastrointestinal: Negative.   Genitourinary: Negative.   Musculoskeletal:      Left knee pain   Skin: Positive for wound.  Neurological: Negative.   Hematological: Negative.   Psychiatric/Behavioral: Negative.   All other systems reviewed and are negative.    Allergies  Review of patient's allergies indicates no known allergies.  Home Medications   Current Outpatient Rx  Name Route Sig Dispense Refill  . AMLODIPINE BESYLATE 10 MG PO TABS Oral Take 10 mg by mouth daily.    Marland Kitchen BISOPROLOL FUMARATE 5 MG PO TABS Oral Take 5 mg by mouth daily.    . DULOXETINE HCL 30 MG PO CPEP Oral Take 30 mg by mouth daily.    Marland Kitchen VALSARTAN 320 MG PO TABS Oral Take 320 mg by mouth daily.    Marland Kitchen HYDROCODONE-ACETAMINOPHEN 5-500 MG PO TABS Oral Take 1-2 tablets by mouth every 6 (six) hours as needed for pain. 30 tablet 0  . TRAMADOL HCL 50 MG PO TABS Oral Take 1 tablet (50 mg total) by mouth every 6 (six) hours as needed for pain. 30 tablet 0    BP 164/92  Pulse 50  Temp(Src) 97.6 F (36.4 C) (Oral)  Resp 18  SpO2 94%  Physical Exam  Nursing note and vitals reviewed. GEN: Well-developed, elderly female in no distress HEENT:Normocephalic. Oropharynx clear without erythema. Patient has abrasion over her upper lip as well as nose. She has no nasal septal hematoma. There is swelling of the upper lip bilaterally. Right front  tooth is slightly loose to palpation. There is no midface instability. EYES: PERRLA BL, no scleral icterus. NECK: Trachea midline, no meningismus. No midline tenderness to palpation or step-off. CV: regular rate and rhythm. No murmurs, rubs, or gallops PULM: No respiratory distress.  No crackles, wheezes, or rales. GI: soft, non-tender. No guarding, rebound, or tenderness. + bowel sounds  GU: deferred Neuro: cranial nerves 2-12 intact, no abnormalities of strength or sensation, A and O x 3 MSK: Patient moves all 4 extremities symmetrically, no deformity, edema. Slight left knee effusion with mild tenderness to palpation. No ligamentous instability or deformity  noted. Skin: No rashes petechiae, purpura, or jaundice. Abrasions noted over the face as mentioned previously. Psych: no abnormality of mood   ED Course  Procedures (including critical care time)  Labs Reviewed - No data to display Ct Head Wo Contrast  11/26/2011  *RADIOLOGY REPORT*  Clinical Data:  Status post fall.  Hit right side of face and upper lip.  CT HEAD WITHOUT CONTRAST CT MAXILLOFACIAL WITHOUT CONTRAST CT CERVICAL SPINE WITHOUT CONTRAST  Technique:  Multidetector CT imaging of the head, cervical spine, and maxillofacial structures were performed using the standard protocol without intravenous contrast. Multiplanar CT image reconstructions of the cervical spine and maxillofacial structures were also generated.  Comparison:  CT head 07/05/2011 CT HEAD  Findings: Stable appearance of the brain.  Normal ventricle size. Negative for hemorrhage, mass effect, mass lesion, or evidence of acute infarction.  No discrete scalp swelling or hematoma is identified.  Visualized paranasal sinuses and mastoid air cells are clear.  The skull is intact.  IMPRESSION: No acute intracranial abnormality.  CT MAXILLOFACIAL  Findings:  No acute facial bone fracture is identified.  The mandibular condyles are located.  There is streak artifact from dental hardware.  A mild mucosal thickening of the inferior left maxillary sinus.  No air-fluid levels in the sinuses.  Soft tissues of the orbits are symmetric.  The globes and lenses are intact.  The retro-orbital fat planes are preserved. Suspect some soft tissue swelling of the upper lip.  Slight subcutaneous stranding of the forehead without discrete hematoma.  IMPRESSION:  1.  Negative for acute facial bone fracture. 2.  Suspect mild soft tissue swelling of the forehead and upper lip.  No discrete hematoma visible by CT.  CT CERVICAL SPINE  Findings:   The cervical spine vertebral bodies are normally aligned from the skull base through the cervicothoracic junction. There  is posterior osseous spurring and mild disc space narrowing is C5-C6.  Anterior osteophyte formation at C6.  Negative for acute fracture.  Prevertebral soft tissue contour is normal.  No evidence of epidural hematoma.  IMPRESSION:  1.  No acute bony abnormality of the cervical spine. 2.  Degenerative disc disease at C5-6.  Original Report Authenticated By: Britta Mccreedy, M.D.   Ct Cervical Spine Wo Contrast  11/26/2011  *RADIOLOGY REPORT*  Clinical Data:  Status post fall.  Hit right side of face and upper lip.  CT HEAD WITHOUT CONTRAST CT MAXILLOFACIAL WITHOUT CONTRAST CT CERVICAL SPINE WITHOUT CONTRAST  Technique:  Multidetector CT imaging of the head, cervical spine, and maxillofacial structures were performed using the standard protocol without intravenous contrast. Multiplanar CT image reconstructions of the cervical spine and maxillofacial structures were also generated.  Comparison:  CT head 07/05/2011 CT HEAD  Findings: Stable appearance of the brain.  Normal ventricle size. Negative for hemorrhage, mass effect, mass lesion, or evidence of acute infarction.  No discrete scalp  swelling or hematoma is identified.  Visualized paranasal sinuses and mastoid air cells are clear.  The skull is intact.  IMPRESSION: No acute intracranial abnormality.  CT MAXILLOFACIAL  Findings:  No acute facial bone fracture is identified.  The mandibular condyles are located.  There is streak artifact from dental hardware.  A mild mucosal thickening of the inferior left maxillary sinus.  No air-fluid levels in the sinuses.  Soft tissues of the orbits are symmetric.  The globes and lenses are intact.  The retro-orbital fat planes are preserved. Suspect some soft tissue swelling of the upper lip.  Slight subcutaneous stranding of the forehead without discrete hematoma.  IMPRESSION:  1.  Negative for acute facial bone fracture. 2.  Suspect mild soft tissue swelling of the forehead and upper lip.  No discrete hematoma visible by CT.   CT CERVICAL SPINE  Findings:   The cervical spine vertebral bodies are normally aligned from the skull base through the cervicothoracic junction. There is posterior osseous spurring and mild disc space narrowing is C5-C6.  Anterior osteophyte formation at C6.  Negative for acute fracture.  Prevertebral soft tissue contour is normal.  No evidence of epidural hematoma.  IMPRESSION:  1.  No acute bony abnormality of the cervical spine. 2.  Degenerative disc disease at C5-6.  Original Report Authenticated By: Britta Mccreedy, M.D.   Dg Knee Complete 4 Views Left  11/26/2011  *RADIOLOGY REPORT*  Clinical Data: Fall anterior knee pain  LEFT KNEE - COMPLETE 4+ VIEW  Comparison: None.  Findings: Four views of the left knee submitted.  There is diffuse osteopenia.  Degenerative changes are noted with narrowing of medial joint compartment.  Mild sclerosis medial tibial plateau. Spurring of medial tibial plateau.  Narrowing of patellofemoral joint space.  Mild prepatellar soft tissue swelling.  Small joint effusion.  No acute fracture or subluxation.  There is spurring of medial and lateral femoral condyles.  IMPRESSION: No acute fracture or subluxation.  Degenerative changes as described above.  Small joint effusion.  Prepatellar soft tissue swelling.  Original Report Authenticated By: Natasha Mead, M.D.   Ct Maxillofacial Wo Cm  11/26/2011  *RADIOLOGY REPORT*  Clinical Data:  Status post fall.  Hit right side of face and upper lip.  CT HEAD WITHOUT CONTRAST CT MAXILLOFACIAL WITHOUT CONTRAST CT CERVICAL SPINE WITHOUT CONTRAST  Technique:  Multidetector CT imaging of the head, cervical spine, and maxillofacial structures were performed using the standard protocol without intravenous contrast. Multiplanar CT image reconstructions of the cervical spine and maxillofacial structures were also generated.  Comparison:  CT head 07/05/2011 CT HEAD  Findings: Stable appearance of the brain.  Normal ventricle size. Negative for  hemorrhage, mass effect, mass lesion, or evidence of acute infarction.  No discrete scalp swelling or hematoma is identified.  Visualized paranasal sinuses and mastoid air cells are clear.  The skull is intact.  IMPRESSION: No acute intracranial abnormality.  CT MAXILLOFACIAL  Findings:  No acute facial bone fracture is identified.  The mandibular condyles are located.  There is streak artifact from dental hardware.  A mild mucosal thickening of the inferior left maxillary sinus.  No air-fluid levels in the sinuses.  Soft tissues of the orbits are symmetric.  The globes and lenses are intact.  The retro-orbital fat planes are preserved. Suspect some soft tissue swelling of the upper lip.  Slight subcutaneous stranding of the forehead without discrete hematoma.  IMPRESSION:  1.  Negative for acute facial bone fracture. 2.  Suspect  mild soft tissue swelling of the forehead and upper lip.  No discrete hematoma visible by CT.  CT CERVICAL SPINE  Findings:   The cervical spine vertebral bodies are normally aligned from the skull base through the cervicothoracic junction. There is posterior osseous spurring and mild disc space narrowing is C5-C6.  Anterior osteophyte formation at C6.  Negative for acute fracture.  Prevertebral soft tissue contour is normal.  No evidence of epidural hematoma.  IMPRESSION:  1.  No acute bony abnormality of the cervical spine. 2.  Degenerative disc disease at C5-6.  Original Report Authenticated By: Britta Mccreedy, M.D.     1. Minor head injury   2. Multiple abrasions   3. Dental trauma   4. Left knee injury       MDM  Patient was evaluated by myself. Patient had a mechanical fall. Given her head trauma a CT was performed for possible subdural bleed. CT of the neck was performed given history of osteoporosis as well as mechanism of fall. This was also negative. CT of the face was performed given concern over possible maxillary fracture associated with dental trauma to the right  front tooth. This showed no signs of fracture. Plain film of the left knee was performed for bony injury and was also unremarkable. Patient remained hemodynamically stable. She was provided with ice packs. Patient was given tramadol for her pain. She was discharged with a prescription for this as well as Vicodin. Family will attempt to followup with her own dentist but was given followup information for the dentist on call should they require it. Patient was given reasons to return and was discharged in good condition.        Cyndra Numbers, MD 11/27/11 [phone]

## 2011-12-16 ENCOUNTER — Ambulatory Visit: Payer: Medicare Other | Admitting: Obstetrics and Gynecology

## 2012-07-31 DIAGNOSIS — H905 Unspecified sensorineural hearing loss: Secondary | ICD-10-CM | POA: Insufficient documentation

## 2012-09-01 ENCOUNTER — Ambulatory Visit: Payer: Medicare Other | Admitting: Obstetrics and Gynecology

## 2012-09-01 ENCOUNTER — Encounter: Payer: Self-pay | Admitting: Obstetrics and Gynecology

## 2012-09-01 VITALS — BP 122/80 | Ht 65.0 in | Wt 148.0 lb

## 2012-09-01 DIAGNOSIS — N390 Urinary tract infection, site not specified: Secondary | ICD-10-CM

## 2012-09-01 DIAGNOSIS — C50919 Malignant neoplasm of unspecified site of unspecified female breast: Secondary | ICD-10-CM | POA: Insufficient documentation

## 2012-09-01 DIAGNOSIS — K8020 Calculus of gallbladder without cholecystitis without obstruction: Secondary | ICD-10-CM

## 2012-09-01 DIAGNOSIS — M8580 Other specified disorders of bone density and structure, unspecified site: Secondary | ICD-10-CM | POA: Insufficient documentation

## 2012-09-01 DIAGNOSIS — C50911 Malignant neoplasm of unspecified site of right female breast: Secondary | ICD-10-CM

## 2012-09-01 DIAGNOSIS — IMO0002 Reserved for concepts with insufficient information to code with codable children: Secondary | ICD-10-CM

## 2012-09-01 LAB — POCT URINALYSIS DIPSTICK
Bilirubin, UA: NEGATIVE
Ketones, UA: NEGATIVE
Spec Grav, UA: 1.02
pH, UA: 5

## 2012-09-01 MED ORDER — RISEDRONATE SODIUM 35 MG PO TABS: 35.0000 mg | ORAL_TABLET | ORAL | Status: DC

## 2012-09-01 MED ORDER — ESTRADIOL 10 MCG VA TABS: ORAL_TABLET | VAGINAL | Status: DC

## 2012-09-01 NOTE — Progress Notes (Signed)
Last Pap: 2013 done at [facility]  WNL: Yes Regular Periods:no Contraception: post menopausal  Monthly Breast exam:yes Tetanus<7yrs:yes Nl.Bladder Function:yes Daily BMs:yes Healthy Diet:yes Calcium:yes Mammogram:yes Date of Mammogram: 12/13 breast cancer Exercise:yes Have often Exercise: occ Seatbelt: yes Abuse at home: no Stressful work:no Sigmoid-colonoscopy: yrs ago wnl per pt Bone Density: Yes osteoperosis PCP: none Change in PMH: breast cancer dx'd at [facility] Change in FMH:n/a  Subjective:    Natalie Mendoza is a 79 y.o. female G2P2 who presents for annual exam.  The patient has no complaints today. She is doing well with her current pessary and has good quality of life, is able to place and remove the pessary without difficulty.   She admits she does not exercise, though she understands this is important for her bones  The following portions of the patient's history were reviewed and updated as appropriate: allergies, current medications, past family history, past medical history, past social history, past surgical history and problem list.  Review of Systems Pertinent items are noted in HPI. Gastrointestinal:No change in bowel habits, no abdominal pain, no rectal bleeding Genitourinary:negative for dysuria, frequency, hematuria, nocturia and urinary incontinence    Objective:     BP 122/80  Ht 5\' 5"  (1.651 m)  Wt 148 lb (67.132 kg)  BMI 24.63 kg/m2  Weight:  Wt Readings from Last 1 Encounters:  09/01/12 148 lb (67.132 kg)     BMI: Body mass index is 24.63 kg/(m^2). General Appearance: Alert, appropriate appearance for age. No acute distress HEENT: Grossly normal Neck / Thyroid: Supple, no masses, nodes or enlargement Lungs: clear to auscultation bilaterally Back: No CVA tenderness Breast Exam: No masses or nodes.No dimpling, nipple retraction or discharge.  She has a well healed incision in the RUO quadrant of the right breast Cardiovascular: Regular rate and  rhythm. S1, S2, no murmur Gastrointestinal: Soft, non-tender, no masses or organomegaly Pelvic Exam: External genitalia: labial inclusion cyst in the crural folds Vaginal: atrophic mucosa and cystocele present, within 4 cm of introitus Cervix: Atrophic Adnexa: No palpable masses Uterus: normal single, nontender Rectovaginal: normal rectal, no masses Lymphatic Exam: Non-palpable nodes in neck, clavicular, axillary, or inguinal regions Skin: no rash or abnormalities Neurologic: Normal gait and speech, no tremor  Psychiatric: Alert and oriented, appropriate affect.    Urinalysis:Not done  urine culture sent    Assessment:  Cystocele with pelvic relaxation well managed with pessary Right breast cancer Osteopenia    Plan:  DXA Continue Actonel Breast cancer followup per Dr. [name] at [facility] No further screening paps indicated with nl pap hx at age 30 Continue Milex ring pessary with support size 6 return annually or prn

## 2013-03-13 ENCOUNTER — Inpatient Hospital Stay (HOSPITAL_COMMUNITY)
Admission: EM | Admit: 2013-03-13 | Discharge: 2013-03-15 | DRG: 066 | Disposition: A | Discharge status: Home / Self Care | Payer: Medicare Other | Attending: Internal Medicine | Admitting: Internal Medicine

## 2013-03-13 ENCOUNTER — Inpatient Hospital Stay (HOSPITAL_COMMUNITY): Payer: Medicare Other

## 2013-03-13 ENCOUNTER — Emergency Department (HOSPITAL_COMMUNITY): Payer: Medicare Other

## 2013-03-13 ENCOUNTER — Encounter (HOSPITAL_COMMUNITY): Payer: Self-pay | Admitting: Neurology

## 2013-03-13 DIAGNOSIS — I10 Essential (primary) hypertension: Secondary | ICD-10-CM | POA: Diagnosis present

## 2013-03-13 DIAGNOSIS — E785 Hyperlipidemia, unspecified: Secondary | ICD-10-CM | POA: Diagnosis present

## 2013-03-13 DIAGNOSIS — G459 Transient cerebral ischemic attack, unspecified: Secondary | ICD-10-CM | POA: Diagnosis present

## 2013-03-13 DIAGNOSIS — M810 Age-related osteoporosis without current pathological fracture: Secondary | ICD-10-CM | POA: Diagnosis present

## 2013-03-13 DIAGNOSIS — I6350 Cerebral infarction due to unspecified occlusion or stenosis of unspecified cerebral artery: Principal | ICD-10-CM | POA: Diagnosis present

## 2013-03-13 DIAGNOSIS — F3289 Other specified depressive episodes: Secondary | ICD-10-CM | POA: Diagnosis present

## 2013-03-13 DIAGNOSIS — R29898 Other symptoms and signs involving the musculoskeletal system: Secondary | ICD-10-CM | POA: Diagnosis present

## 2013-03-13 DIAGNOSIS — I4891 Unspecified atrial fibrillation: Secondary | ICD-10-CM | POA: Diagnosis present

## 2013-03-13 DIAGNOSIS — F329 Major depressive disorder, single episode, unspecified: Secondary | ICD-10-CM | POA: Diagnosis present

## 2013-03-13 DIAGNOSIS — C50919 Malignant neoplasm of unspecified site of unspecified female breast: Secondary | ICD-10-CM | POA: Diagnosis present

## 2013-03-13 DIAGNOSIS — R531 Weakness: Secondary | ICD-10-CM

## 2013-03-13 DIAGNOSIS — M6281 Muscle weakness (generalized): Secondary | ICD-10-CM

## 2013-03-13 LAB — CBC WITH DIFFERENTIAL/PLATELET
Eosinophils Relative: 1 % (ref 0–5)
Hemoglobin: 16.3 g/dL — ABNORMAL HIGH (ref 12.0–15.0)
Lymphocytes Relative: 22 % (ref 12–46)
Lymphs Abs: 2 10*3/uL (ref 0.7–4.0)
MCV: 91.1 fL (ref 78.0–100.0)
Monocytes Relative: 8 % (ref 3–12)
Neutrophils Relative %: 69 % (ref 43–77)
Platelets: 188 10*3/uL (ref 150–400)
RBC: 4.94 MIL/uL (ref 3.87–5.11)
WBC: 8.9 10*3/uL (ref 4.0–10.5)

## 2013-03-13 LAB — COMPREHENSIVE METABOLIC PANEL
ALT: 41 U/L — ABNORMAL HIGH (ref 0–35)
Alkaline Phosphatase: 65 U/L (ref 39–117)
BUN: 13 mg/dL (ref 6–23)
CO2: 26 mEq/L (ref 19–32)
GFR calc Af Amer: >90 mL/min (ref >90)
GFR calc non Af Amer: 88 mL/min — ABNORMAL LOW (ref >90)
Glucose, Bld: 90 mg/dL (ref 70–99)
Potassium: 3.6 mEq/L (ref 3.5–5.1)
Sodium: 142 mEq/L (ref 135–145)

## 2013-03-13 LAB — POCT I-STAT 3, ART BLOOD GAS (G3+)
Patient temperature: 97.9
TCO2: 26 mmol/L (ref 0–100)
pCO2 arterial: 37.4 mmHg (ref 35.0–45.0)
pH, Arterial: 7.436 (ref 7.350–7.450)

## 2013-03-13 LAB — ETHANOL: Alcohol, Ethyl (B): <11 mg/dL (ref 0–11)

## 2013-03-13 MED ORDER — ASPIRIN EC 81 MG PO TBEC
81.0000 mg | DELAYED_RELEASE_TABLET | Freq: Every day | ORAL | Status: DC
  Filled 2013-03-13: qty 1

## 2013-03-13 MED ORDER — ANASTROZOLE 1 MG PO TABS
1.0000 mg | ORAL_TABLET | Freq: Every day | ORAL | Status: DC
  Administered 2013-03-14 – 2013-03-15 (×3): 1 mg via ORAL
  Filled 2013-03-13 (×3): qty 1

## 2013-03-13 MED ORDER — RISEDRONATE SODIUM 5 MG PO TABS: 35.0000 mg | ORAL_TABLET | ORAL | Status: DC

## 2013-03-13 MED ORDER — DILTIAZEM HCL ER 60 MG PO CP12
60.0000 mg | ORAL_CAPSULE | Freq: Two times a day (BID) | ORAL | Status: DC
  Administered 2013-03-13 – 2013-03-15 (×4): 60 mg via ORAL
  Filled 2013-03-13 (×5): qty 1

## 2013-03-13 MED ORDER — AMLODIPINE BESYLATE 5 MG PO TABS
5.0000 mg | ORAL_TABLET | Freq: Every day | ORAL | Status: DC
  Administered 2013-03-14: 5 mg via ORAL
  Filled 2013-03-13 (×2): qty 1

## 2013-03-13 MED ORDER — IRBESARTAN 300 MG PO TABS
300.0000 mg | ORAL_TABLET | Freq: Every day | ORAL | Status: DC
  Administered 2013-03-14 – 2013-03-15 (×2): 300 mg via ORAL
  Filled 2013-03-13 (×2): qty 1

## 2013-03-13 MED ORDER — ASPIRIN EC 325 MG PO TBEC
325.0000 mg | DELAYED_RELEASE_TABLET | Freq: Once | ORAL | Status: AC
  Administered 2013-03-13: 325 mg via ORAL
  Filled 2013-03-13: qty 1

## 2013-03-13 MED ORDER — BISOPROLOL FUMARATE 5 MG PO TABS
2.5000 mg | ORAL_TABLET | Freq: Every day | ORAL | Status: DC
  Administered 2013-03-14 – 2013-03-15 (×2): 2.5 mg via ORAL
  Filled 2013-03-13 (×3): qty 0.5

## 2013-03-13 MED ORDER — DULOXETINE HCL 30 MG PO CPEP
30.0000 mg | ORAL_CAPSULE | Freq: Every day | ORAL | Status: DC
  Administered 2013-03-14 – 2013-03-15 (×3): 30 mg via ORAL
  Filled 2013-03-13 (×3): qty 1

## 2013-03-13 MED ORDER — SODIUM CHLORIDE 0.9 % IV SOLN
INTRAVENOUS | Status: AC
  Administered 2013-03-14: via INTRAVENOUS

## 2013-03-13 NOTE — Consult Note (Signed)
Neurology Consultation Reason for Consult: Left-sided weakness Referring Physician: Rhunette Croft, A  CC: Left-sided weakness  History is obtained from: Patient, son  HPI: Natalie Mendoza is a 79 y.o. female with a history of atrial fibrillation for many years who presents with transient left-sided face and arm weakness that lasted about 15 minutes. She was talking with her son, who called his father said something was wrong. She dropped several items from her hand. Her son arrived to find her with left-sided facial droop. This resolved over approximately 15 minutes. She states that she doesn't really remember these problems. She was able walk while symptomatic.    ROS: A 14 point ROS was performed and is negative except as noted in the HPI.  Past Medical History  Diagnosis Date  . Hypertension   . Atrial fibrillation   . Pelvic relaxation   . Vaginal pessary present   . Osteoporosis   . Hx of joint problems     Family History: Mother-stroke at age 39  Social History: Tob: none  Exam: Current vital signs: BP 165/92  Pulse 82  Temp(Src) 98 F (36.7 C) (Oral)  Resp 18  SpO2 96% Vital signs in last 24 hours: Temp:  [97.9 F (36.6 C)-98 F (36.7 C)] 98 F (36.7 C) (09/23 2201) Pulse Rate:  [73-95] 82 (09/23 2230) Resp:  [15-25] 18 (09/23 2230) BP: (142-165)/(89-100) 165/92 mmHg (09/23 2230) SpO2:  [95 %-97 %] 96 % (09/23 2230)  General: in bed, NAD CV: RRR Mental Status: Patient is awake, alert, oriented to person, place, month, year, and situation. Immediate and remote memory are intact. Patient is able to give a clear and coherent history. No signs of aphasia or neglect Cranial Nerves: II: Visual Fields are full. Pupils are equal, round, and reactive to light.  Discs are sharp III,IV, VI: EOMI without ptosis or diploplia.  V: Facial sensation is symmetric to temperature VII: Facial movement is symmetric.  VIII: hearing is intact to voice X: Uvula elevates  symmetrically XI: Shoulder shrug is symmetric. XII: tongue is midline without atrophy or fasciculations.  Motor: Tone is normal. Bulk is normal. 5/5 strength was present in all four extremities.  Sensory: Sensation is symmetric to light touch and temperature in the arms and legs. Deep Tendon Reflexes: 2+ and symmetric in the biceps and patellae.  Plantars: Toes are downgoing bilaterally.  Cerebellar: FNF intact bilaterally Gait: Patient has a stable casual gait. Able to tandem, heel, and toe walk.   I have reviewed labs in epic and the results pertinent to this consultation are: Cbc - unremarkable CMP mildly elevated alt, bili  I have reviewed the images obtained:CT head - hypodensity in the right occipital region not present on 11/2011 study  Impression: 79 yo F with afib presenting with TIA. She will need to be on anticoagulation if her MRI does not show acute infarct.   Recommendations: 1. HgbA1c, fasting lipid panel 2. MRI, MRA  of the brain without contrast 3. Frequent neuro checks 4. Echocardiogram 5. Carotid dopplers 6. Prophylactic therapy-Antiplatelet med: Aspirin - dose 325mg , would start elequis 5mg  BID 7. Risk factor modification 8. Telemetry monitoring 9. PT consult, OT consult, Speech consult are not needed as patient is back to baseline.  10. With resolved symptoms, patient IS a tpa candidate should she develop symptoms overnight.    Ritta Slot, MD [facility] [phone]  If 7pm- 7am, please page neurology on call at [phone].

## 2013-03-13 NOTE — H&P (Signed)
[facility] History and Physical  Natalie Mendoza ZOX:[id number] DOB: 1932/11/15 DOA: 03/13/2013  Referring physician: ED physician PCP: Pcp Not In System   Chief Complaint: left sided weakness and facial dropp  HPI:  79 year old female with past medical history of hypertension, depression, breast cancer (on anastrozole) who presented to [facility] ED with sudden onset left sided weakness mostly pronounced in left upper extremity as well as left sided facial droop started around 6:20 pm prior to this admission. No slurred speech and no lightheadedness, dizziness or loss of consciousness. The symptoms lasted for 15 minutes. No complaints of chest pain, shortness of breath or palpitations. No abdominal pain, nausea or vomiting. No seizure like activity reported. In ED, vital are stable with BP of 142/89, HR 73 and T 97.9 F. Oxygen saturation was 95% on room air. CT did not reveal acute ischemic event but did show remote infarcts in parietal and occipital areas. BMP and CBC were unremarkable.   Assessment and Plan:  Principal Problem:   Left-sided weakness - TIA or CVA; CT head showed remote small infarcts in right occipital/parietal region but no acute ischemic events - stroke order set in place - follow up MRI/MRA brain - follow up 2 D ECHO, carotid doppler - follow up TSH, lipid panel - PT/OT and SLP evaluation - on aspirin Active Problems:   Atrial fibrillation - HR controlled in 70's and pt not on beta blocker (she is on norvasc) - start Cardizem 60 mg Q 12 hours if BP tolerates; hold norvasc and see if converts to NSR - repeat EKG in am - CHADS scores 3 (HTN, previous stroke) so will need anticoagulation (AC) but first rule out bleeds (follow up MRI) and consider starting AC in am   HTN (hypertension) - continue valsartan - renal function WNL   Dyslipidemia - continue zetia   Depression - continue cymbalta   Breast cancer - on anastrozole   Code Status: Full Family  Communication: Pt at bedside Disposition Plan: PT evaluation      Review of Systems:  Constitutional: Negative for fever, chills and malaise/fatigue. Negative for diaphoresis.  HENT: Negative for hearing loss, ear pain, nosebleeds, congestion, sore throat, neck pain, tinnitus and ear discharge.   Eyes: Negative for blurred vision, double vision, photophobia, pain, discharge and redness.  Respiratory: Negative for cough, hemoptysis, sputum production, shortness of breath, wheezing and stridor.   Cardiovascular: Negative for chest pain, palpitations, orthopnea, claudication and leg swelling.  Gastrointestinal: Negative for nausea, vomiting and abdominal pain. Negative for heartburn, constipation, blood in stool and melena.  Genitourinary: Negative for dysuria, urgency, frequency, hematuria and flank pain.  Musculoskeletal: Negative for myalgias, back pain, joint pain and falls.  Skin: Negative for itching and rash.  Neurological: per HPI  Endo/Heme/Allergies: Negative for environmental allergies and polydipsia. Does not bruise/bleed easily.  Psychiatric/Behavioral: Negative for suicidal ideas. The patient is not nervous/anxious.      Past Medical History  Diagnosis Date  . Hypertension   . Atrial fibrillation   . Pelvic relaxation   . Vaginal pessary present   . Osteoporosis   . Hx of joint problems     Past Surgical History  Procedure Laterality Date  . Breast lumpectomy      Social History:  reports that she has never smoked. She does not have any smokeless tobacco history on file. She reports that she does not drink alcohol or use illicit drugs.  No Known Allergies  Family history significant for heart  disease in parents.  Prior to Admission medications   Medication Sig Start Date End Date Taking? Authorizing Provider  amLODipine (NORVASC) 10 MG tablet Take 5 mg by mouth daily.    Yes Historical Provider, MD  anastrozole (ARIMIDEX) 1 MG tablet Take 1 mg by mouth daily.    Yes Historical Provider, MD  aspirin EC 81 MG tablet Take 81 mg by mouth daily.   Yes Historical Provider, MD  bisoprolol (ZEBETA) 5 MG tablet Take 2.5 mg by mouth daily.    Yes Historical Provider, MD  DULoxetine (CYMBALTA) 30 MG capsule Take 30 mg by mouth daily.   Yes Historical Provider, MD  Estradiol 10 MCG TABS Pt to insert 1 tablet per vagina 2x/weekly 09/01/12  Yes Hal Morales, MD  OVER THE COUNTER MEDICATION Take 1 tablet by mouth daily. Phytophanere (hair & nails) dietary supplement   Yes Historical Provider, MD  risedronate (ACTONEL) 35 MG tablet Take 1 tablet (35 mg total) by mouth every 7 (seven) days. with water on empty stomach, nothing by mouth or lie down for next 30 minutes. 09/01/12  Yes Hal Morales, MD  valsartan (DIOVAN) 320 MG tablet Take 320 mg by mouth daily.   Yes Historical Provider, MD    Physical Exam: Filed Vitals:   03/13/13 1949 03/13/13 2015 03/13/13 2100 03/13/13 2130  BP: 150/93 159/100 148/89 142/89  Pulse: 83 76 73 75  Temp:      TempSrc:      Resp: 19 16 15 19   SpO2: 95% 96% 95% 96%    Physical Exam  Constitutional: Appears well-developed and well-nourished. No distress.  HENT: Normocephalic. External right and left ear normal. Oropharynx is clear and moist.  Eyes: Conjunctivae and EOM are normal. PERRLA, no scleral icterus.  Neck: Normal ROM. Neck supple. No JVD. No tracheal deviation. No thyromegaly.  CVS: RRR, S1/S2 appreciated. Pulmonary: Effort and breath sounds normal, no stridor, rhonchi, wheezes, rales.  Abdominal: Soft. BS +,  no distension, tenderness, rebound or guarding.  Musculoskeletal: Normal range of motion. No edema and no tenderness.  Lymphadenopathy: No lymphadenopathy noted, cervical, inguinal. Neuro: Alert. Normal reflexes, muscle tone coordination. No cranial nerve deficit. Skin: Skin is warm and dry. No rash noted. Not diaphoretic. No erythema. No pallor.  Psychiatric: Normal mood and affect. Behavior, judgment,  thought content normal.   Labs on Admission:  Basic Metabolic Panel:  Recent Labs Lab 03/13/13 2006  NA 142  K 3.6  CL 103  CO2 26  GLUCOSE 90  BUN 13  CREATININE 0.56  CALCIUM 9.5   Liver Function Tests:  Recent Labs Lab 03/13/13 2006  AST 31  ALT 41*  ALKPHOS 65  BILITOT 1.6*  PROT 7.2  ALBUMIN 4.4   No results found for this basename: LIPASE, AMYLASE,  in the last 168 hours No results found for this basename: AMMONIA,  in the last 168 hours CBC:  Recent Labs Lab 03/13/13 2006  WBC 8.9  NEUTROABS 6.2  HGB 16.3*  HCT 45.0  MCV 91.1  PLT 188   Cardiac Enzymes:  Recent Labs Lab 03/13/13 2006  TROPONINI <0.30   BNP: No components found with this basename: POCBNP,  CBG: No results found for this basename: GLUCAP,  in the last 168 hours  Radiological Exams on Admission: Ct Head Wo Contrast 03/13/2013    IMPRESSION: 1. I suspect a interval but remote small infarct in the right occipital parietal region on image 18 of series 2. This is not thought  to be acute. Otherwise negative exam.   Electronically Signed   By: Herbie Baltimore   On: 03/13/2013 20:54    EKG: atrial fibrillation  Debbora Presto, MD  [facility] Pager [phone]  If 7PM-7AM, please contact night-coverage [email or web address] Password [facility] 03/13/2013, 10:12 PM

## 2013-03-13 NOTE — Progress Notes (Signed)
PHARMACIST - PHYSICIAN COMMUNICATION  CONCERNING: P&T Medication Policy Regarding Oral Bisphosphonates  RECOMMENDATION: Your order for alendronate (Fosamax), ibandronate (Boniva), or risedronate (Actonel) has been discontinued at this time.  If the patient's post-hospital medical condition warrants safe use of this class of drugs, please resume the pre-hospital regimen upon discharge.  DESCRIPTION:  Alendronate (Fosamax), ibandronate (Boniva), and risedronate (Actonel) can cause severe esophageal erosions in patients who are unable to remain upright at least 30 minutes after taking this medication.   Since brief interruptions in therapy are thought to have minimal impact on bone mineral density, the Pharmacy & Therapeutics Committee has established that bisphosphonate orders should be routinely discontinued during hospitalization.   To override this safety policy and permit administration of Boniva, Fosamax, or Actonel in the hospital, prescribers must write "DO NOT HOLD" in the comments section when placing the order for this class of medications.   Janice Coffin

## 2013-03-13 NOTE — ED Provider Notes (Signed)
CSN: [id number]     Arrival date & time 03/13/13  1859 History   First MD Initiated Contact with Patient 03/13/13 1942     Chief Complaint  Patient presents with  . Transient Ischemic Attack   (Consider location/radiation/quality/duration/timing/severity/associated sxs/prior Treatment) Patient is a 79 y.o. female presenting with extremity weakness. The history is provided by the patient and a relative. No language interpreter was used.  Extremity Weakness This is a new problem. The current episode started today. The problem occurs rarely. The problem has been resolved. Associated symptoms include weakness. Pertinent negatives include no abdominal pain, chest pain, congestion, coughing, fever, headaches, nausea, rash, sore throat or vomiting. Nothing aggravates the symptoms. She has tried nothing for the symptoms. The treatment provided significant relief.    Past Medical History  Diagnosis Date  . Hypertension   . Atrial fibrillation   . Pelvic relaxation   . Vaginal pessary present   . Osteoporosis   . Hx of joint problems    Past Surgical History  Procedure Laterality Date  . Breast lumpectomy     No family history on file. History  Substance Use Topics  . Smoking status: Never Smoker   . Smokeless tobacco: Not on file  . Alcohol Use: No   OB History   Grav Para Term Preterm Abortions TAB SAB Ect Mult Living   2 2             Review of Systems  Constitutional: Negative for fever.  HENT: Negative for congestion, sore throat and rhinorrhea.   Respiratory: Negative for cough and shortness of breath.   Cardiovascular: Negative for chest pain.  Gastrointestinal: Negative for nausea, vomiting, abdominal pain and diarrhea.  Genitourinary: Negative for dysuria and hematuria.  Musculoskeletal: Positive for extremity weakness.  Skin: Negative for rash.  Neurological: Positive for facial asymmetry, speech difficulty and weakness. Negative for syncope, light-headedness and  headaches.  All other systems reviewed and are negative.    Allergies  Review of patient's allergies indicates no known allergies.  Home Medications   Current Outpatient Rx  Name  Route  Sig  Dispense  Refill  . amLODipine (NORVASC) 10 MG tablet   Oral   Take 5 mg by mouth daily.          Marland Kitchen anastrozole (ARIMIDEX) 1 MG tablet   Oral   Take 1 mg by mouth daily.         Marland Kitchen aspirin EC 81 MG tablet   Oral   Take 81 mg by mouth daily.         . bisoprolol (ZEBETA) 5 MG tablet   Oral   Take 2.5 mg by mouth daily.          . DULoxetine (CYMBALTA) 30 MG capsule   Oral   Take 30 mg by mouth daily.         . Estradiol 10 MCG TABS      Pt to insert 1 tablet per vagina 2x/weekly   8 tablet   12   . OVER THE COUNTER MEDICATION   Oral   Take 1 tablet by mouth daily. Phytophanere (hair & nails) dietary supplement         . risedronate (ACTONEL) 35 MG tablet   Oral   Take 1 tablet (35 mg total) by mouth every 7 (seven) days. with water on empty stomach, nothing by mouth or lie down for next 30 minutes.   4 tablet   12   . valsartan (  DIOVAN) 320 MG tablet   Oral   Take 320 mg by mouth daily.          BP 153/95  Pulse 95  Temp(Src) 97.9 F (36.6 C) (Oral)  Resp 18  SpO2 97% Physical Exam  Nursing note and vitals reviewed. Constitutional: She is oriented to person, place, and time. She appears well-developed and well-nourished.  HENT:  Head: Normocephalic and atraumatic.  Right Ear: External ear normal.  Left Ear: External ear normal.  Eyes: EOM are normal. Pupils are equal, round, and reactive to light.  Neck: Normal range of motion. Neck supple.  Cardiovascular: Normal rate, regular rhythm, normal heart sounds and intact distal pulses.  Exam reveals no gallop and no friction rub.   No murmur heard. Pulmonary/Chest: Effort normal and breath sounds normal. No respiratory distress. She has no wheezes. She has no rales. She exhibits no tenderness.   Abdominal: Soft. Bowel sounds are normal. She exhibits no distension. There is no tenderness. There is no rebound.  Musculoskeletal: Normal range of motion. She exhibits no edema and no tenderness.  Lymphadenopathy:    She has no cervical adenopathy.  Neurological: She is alert and oriented to person, place, and time.  Neurologic exam: CN I-XII: grossly intact, Sensation: normal in upper and lower extremities, Strength 5/5 in both upper and lower extremities, Coordination intact.  Skin: Skin is warm. No rash noted.  Psychiatric: She has a normal mood and affect. Her behavior is normal.    ED Course  Procedures (including critical care time) Labs Review Labs Reviewed  CBC WITH DIFFERENTIAL - Abnormal; Notable for the following:    Hemoglobin 16.3 (*)    MCHC 36.2 (*)    All other components within normal limits  COMPREHENSIVE METABOLIC PANEL - Abnormal; Notable for the following:    ALT 41 (*)    Total Bilirubin 1.6 (*)    GFR calc non Af Amer 88 (*)    All other components within normal limits  POCT I-STAT 3, BLOOD GAS (G3+) - Abnormal; Notable for the following:    pO2, Arterial 62.0 (*)    Bicarbonate 25.3 (*)    All other components within normal limits  TROPONIN I  APTT  PROTIME-INR  ETHANOL  HEMOGLOBIN A1C  LIPID PANEL   Imaging Review Dg Chest 2 View  03/13/2013   CLINICAL DATA:  Left side weakness.  EXAM: CHEST  2 VIEW  COMPARISON:  PA and lateral chest 07/05/2011.  FINDINGS: There is cardiomegaly without edema. The lungs are clear. No pneumothorax or pleural fluid. Surgical clips right breast are noted.  IMPRESSION: Cardiomegaly without acute disease.   Electronically Signed   By: Drusilla Kanner M.D.   On: 03/13/2013 23:21   Ct Head Wo Contrast  03/13/2013   CLINICAL DATA:  Left facial droop, left-sided weakness. Transient ischemic attack.  EXAM: CT HEAD WITHOUT CONTRAST  TECHNIQUE: Contiguous axial images were obtained from the base of the skull through the vertex  without intravenous contrast.  COMPARISON:  11/26/2011  FINDINGS: Asymmetric peripherally hypodensity in the right occipital parietal region on image 18 of series 2 is suspicious for a small remote infarct but was not apparent on 11/26/2011. Fortuitous imaging of a sulcus is a less likely differential diagnostic consideration.  Otherwise, the brainstem, cerebellum, cerebral peduncles, thalamus, basal ganglia, basilar cisterns, and ventricular system appear within normal limits. No intracranial hemorrhage, mass lesion, or acute CVA.  IMPRESSION: 1. I suspect a interval but remote small infarct in the  right occipital parietal region on image 18 of series 2. This is not thought to be acute. Otherwise negative exam.   Electronically Signed   By: Herbie Baltimore   On: 03/13/2013 20:54    MDM   1. Left-sided weakness   2. TIA (transient ischemic attack)   3. Breast cancer, unspecified laterality   4. Dyslipidemia   5. HTN (hypertension)    7:51 PM Pt is a 79 y.o. female with pertinent PMHX of Afib not on anticoagulation who presents to the ED with left sided facial droop, slurred speech and left arm weakness. Pt complaint of weakness to left upper extremity that occurred 6 PM lasting 15 mins. Associated facial droop, slurred speech, no gait abnormalities. Pt was last noted to be normal at [facility]. Pt denies associated vertigo, lightheadedness, visual change, changes to hearing. Denies chest pain, shortness of breath. Denies recent illness, nausea vomiting or diarrhea  Code Stroke was/was not called for symptom resolution  On exam: AFVSS, Neurologic exam: CN I-XII: grossly intact, Sensation: normal in upper and lower extremities, Strength 5/5 in both upper and lower extremities, Coordination intact. Concern for TIA given risk factor Afib not on anticoagulation (CHADSVASC score of 4) Pt has decline anticoagulation in the past and sees a cardiologist at [facility]. Plan for TIA workup and consultation to  neurology  EKG personally reviewed by myself showed afib, RBBB Rate of 87, PR NAms, QRS QT/QTC 412/449ms, right axis, without evidence of new ischemia. Comparison showed similar, indication: TIA   Review of Labs: CBC: no leukocytosis, H&H stable CMP: no electrolyte abnormalities, no elevation LFTs coags within normal limits ETOH negative Troponin: <0.3  CXR PA/LAt per my read showed no ptx, no pna no cardiomegaly.  CT head concerning for possible small infarct in the right occipital parietal region. Plan to consult neurology for further recommendations. Per neurology concerning clinical picture for TIA given pt has afib not on anticoagulation. Plan for admission to hospitalist for further evaluation and management.  The patient appears reasonably stabilized for admission considering the current resources, flow, and capabilities available in the ED at this time, and I doubt any other [facility] requiring further screening and/or treatment in the ED prior to admission.   Plan for admission to hospitalist for further evaluation and management. PT transferred to floor VSS.  Labs, EKG and imaging reviewed by myself and considered in medical decision making if ordered.  Imaging interpreted by radiology. Pt was discussed with my attending, Dr. Bjorn Loser, MD 03/14/13 0025

## 2013-03-13 NOTE — ED Notes (Signed)
Per EMS- Pt comes from home, today at 1800 her family noticed she was having difficulty holding a glass. At 1820 had left sided facial droop, left arm drift and left side weakness. About 15 mins later, all of these symptoms resolved. There are no deficits at this time. Pt is at baseline. Pt is a x 4. BP 140/100, HR 90 in AFIB with hx, CBG 107, RR 18, 96% RA. 20 L. AC SL. Pt is supposed to be taking blood thinner but is not taking.

## 2013-03-13 NOTE — Progress Notes (Signed)
Unit CM UR Completed by [facility] ED CM  W. Breylon Sherrow RN  

## 2013-03-14 ENCOUNTER — Inpatient Hospital Stay (HOSPITAL_COMMUNITY): Payer: Medicare Other

## 2013-03-14 ENCOUNTER — Encounter (HOSPITAL_COMMUNITY): Payer: Self-pay | Admitting: *Deleted

## 2013-03-14 DIAGNOSIS — F329 Major depressive disorder, single episode, unspecified: Secondary | ICD-10-CM

## 2013-03-14 DIAGNOSIS — I369 Nonrheumatic tricuspid valve disorder, unspecified: Secondary | ICD-10-CM

## 2013-03-14 LAB — HEMOGLOBIN A1C
Hgb A1c MFr Bld: 6.1 % — ABNORMAL HIGH (ref <5.7)
Mean Plasma Glucose: 128 mg/dL — ABNORMAL HIGH (ref <117)

## 2013-03-14 LAB — LIPID PANEL
LDL Cholesterol: 87 mg/dL (ref 0–99)
Triglycerides: 70 mg/dL (ref <150)

## 2013-03-14 MED ORDER — APIXABAN 5 MG PO TABS
5.0000 mg | ORAL_TABLET | Freq: Two times a day (BID) | ORAL | Status: DC
  Administered 2013-03-14 – 2013-03-15 (×3): 5 mg via ORAL
  Filled 2013-03-14 (×4): qty 1

## 2013-03-14 MED ORDER — ZOLPIDEM TARTRATE 5 MG PO TABS
5.0000 mg | ORAL_TABLET | Freq: Once | ORAL | Status: AC
  Administered 2013-03-14: 5 mg via ORAL
  Filled 2013-03-14: qty 1

## 2013-03-14 MED ORDER — INFLUENZA VAC SPLIT QUAD 0.5 ML IM SUSP
0.5000 mL | INTRAMUSCULAR | Status: AC
  Administered 2013-03-15: 0.5 mL via INTRAMUSCULAR
  Filled 2013-03-14: qty 0.5

## 2013-03-14 MED ORDER — APIXABAN 5 MG PO TABS: 5.0000 mg | ORAL_TABLET | Freq: Two times a day (BID) | ORAL | Status: DC

## 2013-03-14 NOTE — Progress Notes (Signed)
Echocardiogram 2D Echocardiogram has been performed.  Natalie Mendoza 03/14/2013, 9:41 AM

## 2013-03-14 NOTE — Progress Notes (Signed)
Stroke Team Progress Note  HISTORY Natalie Mendoza is a 79 y.o. female with a history of atrial fibrillation for many years who presented 03/13/2013 with transient left-sided face and arm weakness that lasted about 15 minutes. She was talking with her son, who called his father and said something was wrong. She dropped several items from her hand. Her son arrived to find her with left-sided facial droop. This resolved over approximately 15 minutes. She stated that she didn't really remember these problems. She was able walk while symptomatic.  Patient was not a TPA candidate secondary to early resolution of symptoms.  SUBJECTIVE Multiple family members in the room. The patient feels she is back to baseline. She has a known history of atrial fibrillation followed by Dr. Theophilus Bones at [facility]. He has recommended Eliquis in the past; however, the patient declined secondary to the side effect profile. After talking to Dr. Pearlean Brownie today the patient agrees to Eliquis.  OBJECTIVE Most recent Vital Signs: Filed Vitals:   03/14/13 0000 03/14/13 0228 03/14/13 0424 03/14/13 0602  BP: 147/85 124/79 156/93 138/84  Pulse: 81 78 74 69  Temp: 98.1 F (36.7 C) 98.3 F (36.8 C) 98.8 F (37.1 C) 98.5 F (36.9 C)  TempSrc: Oral Oral Oral Oral  Resp: 18 16 18 18   Height: 5\' 5"  (1.651 m)     Weight: 62.2 kg (137 lb 2 oz)     SpO2: 95% 93% 95% 96%   CBG (last 3)  No results found for this basename: GLUCAP,  in the last 72 hours  IV Fluid Intake:   . sodium chloride 75 mL/hr at 03/14/13 0014    MEDICATIONS  . amLODipine  5 mg Oral Daily  . anastrozole  1 mg Oral Daily  . aspirin EC  81 mg Oral Daily  . bisoprolol  2.5 mg Oral Daily  . diltiazem  60 mg Oral Q12H  . DULoxetine  30 mg Oral Daily  . [START ON 03/15/2013] influenza vac split quadrivalent PF  0.5 mL Intramuscular Tomorrow-1000  . irbesartan  300 mg Oral Daily   PRN:    Diet:  General thin liquids Activity:  As  tolerated DVT Prophylaxis:  SCDs  CLINICALLY SIGNIFICANT STUDIES Basic Metabolic Panel:   Recent Labs Lab 03/13/13 2006  NA 142  K 3.6  CL 103  CO2 26  GLUCOSE 90  BUN 13  CREATININE 0.56  CALCIUM 9.5   Liver Function Tests:   Recent Labs Lab 03/13/13 2006  AST 31  ALT 41*  ALKPHOS 65  BILITOT 1.6*  PROT 7.2  ALBUMIN 4.4   CBC:   Recent Labs Lab 03/13/13 2006  WBC 8.9  NEUTROABS 6.2  HGB 16.3*  HCT 45.0  MCV 91.1  PLT 188   Coagulation:   Recent Labs Lab 03/13/13 2006  LABPROT 13.0  INR 1.00   Cardiac Enzymes:   Recent Labs Lab 03/13/13 2006  TROPONINI <0.30   Urinalysis: No results found for this basename: COLORURINE, APPERANCEUR, LABSPEC, PHURINE, GLUCOSEU, HGBUR, BILIRUBINUR, KETONESUR, PROTEINUR, UROBILINOGEN, NITRITE, LEUKOCYTESUR,  in the last 168 hours Lipid Panel    Component Value Date/Time   CHOL 173 03/14/2013 0715   TRIG 70 03/14/2013 0715   HDL 72 03/14/2013 0715   CHOLHDL 2.4 03/14/2013 0715   VLDL 14 03/14/2013 0715   LDLCALC 87 03/14/2013 0715   HgbA1C  No results found for this basename: HGBA1C    Urine Drug Screen:   No results found for  this basename: labopia,  cocainscrnur,  labbenz,  amphetmu,  thcu,  labbarb    Alcohol Level:   Recent Labs Lab 03/13/13 2006  ETH <11    Dg Chest 2 View 03/13/2013 Cardiomegaly without acute disease.     Ct Head Wo Contrast 03/13/2013 1. I suspect a interval but remote small infarct in the right occipital parietal region on image 18 of series 2. This is not thought to be acute. Otherwise negative exam.    Mr Brain Wo Contrast 03/14/2013   Acute nonhemorrhagic infarct right frontal lobe involving the right frontal operculum region. Tiny acute infarct posterior aspect of the right opercular region.      MRA HEAD Mild to moderate narrowing distal aspect of the M1 segment of the right middle cerebral artery.  Decrease number of visualized right middle cerebral artery branch vessels  consistent with patient's acute infarct.      2D Echocardiogram  pending  Carotid Doppler  pending   EKG atrial fibrillation. A ventricular response 87 beats per minute.  Therapy Recommendations pending  Physical Exam    Mental Status:  Patient is awake, alert, oriented to person, place, month, year, and situation.  Immediate and remote memory are intact.  Patient is able to give a clear and coherent history.  No signs of aphasia or neglect  Cranial Nerves:  II: Visual Fields are full. Pupils are equal, round, and reactive to light. Discs are sharp  III,IV, VI: EOMI without ptosis or diploplia.  V: Facial sensation is symmetric to temperature  VII: Facial movement is symmetric.  VIII: hearing is intact to voice  X: Uvula elevates symmetrically  XI: Shoulder shrug is symmetric.  XII: tongue is midline without atrophy or fasciculations.  Motor:  Tone is normal. Bulk is normal. 5/5 strength was present in all four extremities.  Sensory:  Sensation is symmetric to light touch and temperature in the arms and legs.  Deep Tendon Reflexes:  2+ and symmetric in the biceps and patellae.  Plantars:  Toes are downgoing bilaterally.  Cerebellar:  FNF intact bilaterally  Gait:  Patient has a stable casual gait. Able to tandem, heel, and toe walk.   ASSESSMENT [name] is a 79 y.o. female presenting with transient left arm and left face weakness which lasted approximately 15 minutes. TPA was not given secondary to resolution of symptoms prior to arrival. An MRI revealed an acute nonhemorrhagic infarct right frontal lobe involving the right frontal operculum region. Tiny acute infarct posterior aspect of the right opercular region. Infarcts felt to be embolic secondary to atrial fibrillation. On aspirin 81 mg orally every day prior to admission. Now on aspirin 81 mg orally every day for secondary stroke prevention. Patient with resultant resolution of deficits. Work up  underway.   Atrial fibrillation with controlled ventricular response.  Hypertension   Dyslipidemia on Zetia  Depression  Breast cancer  Hospital day # 1  TREATMENT/PLAN  Consider change to Coumadin or newer agents for secondary stroke prevention.  Await 2-D echo, carotid Dopplers, and hemoglobin A1c.  Await therapy evaluations  Probable discharge tomorrow  Delton See PA-C [facility] Pager [phone] 03/14/2013, 9:00 AM  I have personally obtained a history, examined the patient, evaluated imaging results, and formulated the assessment and plan of care. I agree with the above. Delia Heady, MD

## 2013-03-14 NOTE — Progress Notes (Signed)
ANTICOAGULATION CONSULT NOTE - Initial Consult  Pharmacy Consult for Eliquis Indication: atrial fibrillation  No Known Allergies  Patient Measurements: Height: 5\' 5"  (165.1 cm) Weight: 137 lb 2 oz (62.2 kg) IBW/kg (Calculated) : 57   Vital Signs: Temp: 98.5 F (36.9 C) (09/24 0602) Temp src: Oral (09/24 0602) BP: 138/84 mmHg (09/24 0602) Pulse Rate: 69 (09/24 0602)  Labs:  Recent Labs  03/13/13 2006  HGB 16.3*  HCT 45.0  PLT 188  APTT 24  LABPROT 13.0  INR 1.00  CREATININE 0.56  TROPONINI <0.30    Estimated Creatinine Clearance: 53 ml/min (by C-G formula based on Cr of 0.56).   Medical History: Past Medical History  Diagnosis Date  . Hypertension   . Atrial fibrillation   . Pelvic relaxation   . Vaginal pessary present   . Osteoporosis   . Hx of joint problems     Medications:  Scheduled:  . amLODipine  5 mg Oral Daily  . anastrozole  1 mg Oral Daily  . bisoprolol  2.5 mg Oral Daily  . diltiazem  60 mg Oral Q12H  . DULoxetine  30 mg Oral Daily  . [START ON 03/15/2013] influenza vac split quadrivalent PF  0.5 mL Intramuscular Tomorrow-1000  . irbesartan  300 mg Oral Daily    Assessment: 75 YOF with known history of AFib who has refused anticoagulation in the past per notes. Admitted for TIA/stroke workup and she presented with left side facial droop, left arm weakness and slurred speech. MRI shows small infarct in right opercular region. Now to start Eliquis for anticoagulation in setting of AFib. CBC at baseline is ok- Hgb elevated slightly, plts WNL. No bleeding noted. Patient is 79 years old, weights 62.2kg and SCr is 0.56.   Goal of Therapy:  appropriate dosing based on age, weight, and SCr Monitor platelets by anticoagulation protocol: Yes   Plan:  1. Start Eliquis 5mg  BID base on age, weight and SCr 2. Note: in 3 years when patient is 89, if her SCr is >1.5 OR weight is <60kg, she will need to be transitioned to 2.5mg  BID dosing 3. Will  consult care management to assess affordability of Eliquis for patient 4. F/u CBC and any s/s bleeding  Shaena Parkerson D. Khy Pitre, PharmD Clinical Pharmacist Pager: [phone] 03/14/2013 10:25 AM

## 2013-03-14 NOTE — Progress Notes (Signed)
OT Cancellation Note  Patient Details Name: Natalie Mendoza MRN: [id number] DOB: 04/28/1933   Cancelled Treatment:    Reason Eval/Treat Not Completed: OT screened, no needs identified, will sign off  Spoke with PT and patient. Pt does not have any OT needs at this time. Please re-order if needed. Thank you.   Earlie Raveling OTR/L [phone] 03/14/2013, 12:15 PM

## 2013-03-14 NOTE — Evaluation (Signed)
Physical Therapy Evaluation Patient Details Name: Natalie Mendoza MRN: [id number] DOB: 1933/05/19 Today's Date: 03/14/2013 Time: 8295-6213 PT Time Calculation (min): 18 min  PT Assessment / Plan / Recommendation History of Present Illness  pt presents with L sided facial droop and L UE weakness.    Clinical Impression  Pt presents with all sx resolved and no further therapy needs.  Will sign off.      PT Assessment  Patent does not need any further PT services    Follow Up Recommendations  No PT follow up    Does the patient have the potential to tolerate intense rehabilitation      Barriers to Discharge        Equipment Recommendations  None recommended by PT    Recommendations for Other Services     Frequency      Precautions / Restrictions Precautions Precautions: None Restrictions Weight Bearing Restrictions: No   Pertinent Vitals/Pain Denies pain.        Mobility  Bed Mobility Bed Mobility: Supine to Sit;Sitting - Scoot to Edge of Bed;Sit to Supine Supine to Sit: 7: Independent Sitting - Scoot to Edge of Bed: 7: Independent Sit to Supine: 7: Independent Transfers Transfers: Sit to Stand;Stand to Sit Sit to Stand: 7: Independent;With upper extremity assist;From bed Stand to Sit: 7: Independent;With upper extremity assist;To bed Ambulation/Gait Ambulation/Gait Assistance: 7: Independent Ambulation Distance (Feet): 500 Feet Assistive device: None Gait Pattern: Within Functional Limits Stairs: Yes Stairs Assistance: 6: Modified independent (Device/Increase time) Stair Management Technique: One rail Right;Forwards Number of Stairs: 5 Wheelchair Mobility Wheelchair Mobility: No Modified Rankin (Stroke Patients Only) Pre-Morbid Rankin Score: No symptoms Modified Rankin: No symptoms    Exercises     PT Diagnosis:    PT Problem List:   PT Treatment Interventions:       PT Goals(Current goals can be found in the care plan section)    Visit  Information  Last PT Received On: 03/14/13 Assistance Needed: +1 History of Present Illness: pt presents with L sided facial droop and L UE weakness.         Prior Functioning  Home Living Family/patient expects to be discharged to:: Private residence Living Arrangements: Alone Available Help at Discharge: Family;Available PRN/intermittently Type of Home: House Home Access: Stairs to enter [employer] of Steps: 3 Entrance Stairs-Rails: Right Home Layout: Multi-level;Able to live on main level with bedroom/bathroom Alternate Level Stairs-Number of Steps: flight Alternate Level Stairs-Rails: Right Home Equipment: Shower seat - built in Prior Function Level of Independence: Independent Communication Communication: No difficulties    Cognition  Cognition Arousal/Alertness: Awake/alert Behavior During Therapy: WFL for tasks assessed/performed Overall Cognitive Status: Within Functional Limits for tasks assessed    Extremity/Trunk Assessment Upper Extremity Assessment Upper Extremity Assessment: Overall WFL for tasks assessed Lower Extremity Assessment Lower Extremity Assessment: Overall WFL for tasks assessed Cervical / Trunk Assessment Cervical / Trunk Assessment: Normal   Balance Balance Balance Assessed: Yes Standardized Balance Assessment Standardized Balance Assessment: Dynamic Gait Index Dynamic Gait Index Level Surface: Normal Change in Gait Speed: Normal Gait with Horizontal Head Turns: Normal Gait with Vertical Head Turns: Normal Gait and Pivot Turn: Normal Step Over Obstacle: Normal Step Around Obstacles: Normal Steps: Mild Impairment Total Score: 23  End of Session PT - End of Session Equipment Utilized During Treatment: Gait belt Activity Tolerance: Patient tolerated treatment well Patient left: in bed;with call bell/phone within reach;with family/visitor present Nurse Communication: Mobility status  GP     Khamille Beynon,  Alison Murray,  [place] [phone] 03/14/2013, 10:39 AM

## 2013-03-14 NOTE — Progress Notes (Signed)
*  PRELIMINARY RESULTS* Vascular Ultrasound Carotid Duplex (Doppler) has been completed.  Preliminary findings: Bilateral: 1-39% ICA stenosis. Antegrade vertebral flow.   Farrel Demark, RDMS, RVT  03/14/2013, 9:07 AM

## 2013-03-14 NOTE — Evaluation (Signed)
Speech Language Pathology Evaluation Patient Details Name: Natalie Mendoza MRN: [id number] DOB: 08-Jun-1933 Today's Date: 03/14/2013 Time: 8295-6213 SLP Time Calculation (min): 60 min  Problem List:  Patient Active Problem List   Diagnosis Date Noted  . Left-sided weakness 03/13/2013  . TIA (transient ischemic attack) 03/13/2013  . HTN (hypertension) 03/13/2013  . Dyslipidemia 03/13/2013  . Depression 03/13/2013  . Breast cancer 09/01/2012   Past Medical History:  Past Medical History  Diagnosis Date  . Hypertension   . Atrial fibrillation   . Pelvic relaxation   . Vaginal pessary present   . Osteoporosis   . Hx of joint problems    Past Surgical History:  Past Surgical History  Procedure Laterality Date  . Breast lumpectomy     HPI:  79 year old female admitted 03/12/13 with left sided weakness and facial droop, now resolved. PMH significant for HTN, depression, breast CA, Afib.  CT indicated remote parietal and occipital infarcts, nothing acute.   Assessment / Plan / Recommendation Clinical Impression  [place] Cognitive Assessment (MoCA version 7.2) administered.  Pt scored 21/30 (n=>25/30). Difficulty noted with visuospatial skills (inaccurate copying of 3D figure, incorrect hand length on clock), Naming (named rhino instead of hippo), serial subtraction (0 correct responses), verbal fluency (named 6 words; n=>10), abstraction, and delayed recall (4/5 recalled).  Sustained attention and orientation were [facility].  Pt speech and language appear to be at baseline.   Contributing factors to decreased performance may very well include fatigue, given pt reported lack of sleep recently, however, given pt's independence at home and score on MoCA, follow up at [facility] is recommended upon discharge from acute care.    SLP Assessment  All further Speech Lanaguage Pathology  needs can be addressed in the next venue of care    Follow Up Recommendations  Outpatient SLP    Frequency and Duration        Pertinent Vitals/Pain No pain reported   SLP Evaluation Prior Functioning  Cognitive/Linguistic Baseline: Within functional limits Type of Home: House  Lives With: Alone Available Help at Discharge: Family;Available PRN/intermittently Education: completed college Vocation: Unemployed   Cognition  Overall Cognitive Status: Impaired/Different from baseline Arousal/Alertness: Awake/alert Orientation Level: Oriented X4 Comments: [place] Cognitive Assessment administered (version 7.2) pt scored 21/30 (n=>25/30).     Comprehension  Auditory Comprehension Overall Auditory Comprehension: Appears within functional limits for tasks assessed    Expression Expression Primary Mode of Expression: Verbal Written Expression Dominant Hand: Right   Oral / Motor Oral Motor/Sensory Function Overall Oral Motor/Sensory Function: Appears within functional limits for tasks assessed Motor Speech Overall Motor Speech: Appears within functional limits for tasks assessed   GO   [name] [place], [facility], CCC-SLP [phone]   Leigh Aurora 03/14/2013, 3:32 PM

## 2013-03-14 NOTE — Progress Notes (Signed)
[facility] PROGRESS NOTE  Natalie Mendoza ZOX:[id number] DOB: 10-25-1932 DOA: 03/13/2013 PCP: Default, Provider, MD  Assessment/Plan: Principal Problem:   Left-sided weakness Active Problems:   Breast cancer   TIA (transient ischemic attack)   HTN (hypertension)   Dyslipidemia   Depression    1. Left-sided weakness/Acute CVA: Patient with significant cerebrovascular risk factors, presenting with sudden onset left-sided weakness. Head CT scan showed remote small infarcts in right occipital/parietal region but no acute ischemic events. Brain MRI showed acute nonhemorrhagic infarct right frontal lobe involving the right  frontal operculum region as well as a tiny acute infarct posterior aspect of the  right opercular region. MRA revealed mild to moderate narrowing distal aspect of the M1 segment of the right middle cerebral artery. Decreased number of visualized right middle cerebral artery branch vessels consistent with patient's acute infarct. 2D Echocardiogram, carotid doppler, TSH, Lipid panel are all pending. For PT/OT and SLP evaluation. Patient was on low dose ASA, pre-admission. Dr Onalee Hua provided neurology consultation. Anticoagulation with Eliquis has been recommended.  2. Atrial fibrillation: This is chronic, and rate-controlled. Admitting MD has commenced patient on Cardizem, in place of pre-admission Norvasc. CHADS scores 3, so will benefit from anticoagulation, particularly in view of new event. There appears to be no contraindication.  3. HTN (hypertension): Reasonably controlled in context of new CVA.   4. Dyslipidemia: Awaiting Lipid profile. Meanwhile, continued on Zetia.   5. Depression: Stable.  6. Breast cancer: On Anastrozole. Clinically stable. No evidence of local recurrence.    Code Status: Full code.  Family Communication:  Disposition Plan: To be determined   Brief narrative: 79 year old female with past medical history of hypertension,  depression, breast cancer s/p lumpectomy (on Anastrozole), osteoporosis, who presented to [facility] ED with sudden onset left sided weakness mostly pronounced in left upper extremity as well as left sided facial droop started around 6:20 pm on 03/13/13. No slurred speech and no lightheadedness, dizziness or loss of consciousness. The symptoms lasted for 15 minutes. No complaints of chest pain, shortness of breath or palpitations. No abdominal pain, nausea or vomiting. No seizure like activity reported. In ED, vitals were stable with BP of 142/89, HR 73 and T 97.9 F. Oxygen saturation was 95% on room air. CT did not reveal acute ischemic event but did show remote infarcts in parietal and occipital areas. BMP and CBC were unremarkable. Admitted for further evaluation and management.      Consultants:  Dr Ritta Slot.   Procedures:  CXR.  Head Ct Scan.   Antibiotics:  N/A.   HPI/Subjective: No new issues.   Objective: Vital signs in last 24 hours: Temp:  [97.9 F (36.6 C)-98.8 F (37.1 C)] 98.5 F (36.9 C) (09/24 0602) Pulse Rate:  [69-95] 69 (09/24 0602) Resp:  [15-25] 18 (09/24 0602) BP: (124-165)/(79-100) 138/84 mmHg (09/24 0602) SpO2:  [93 %-97 %] 96 % (09/24 0602) Weight:  [62.2 kg (137 lb 2 oz)] 62.2 kg (137 lb 2 oz) (09/24 0000) Weight change:  Last BM Date: 03/13/13  Intake/Output from previous day: 09/23 0701 - 09/24 0700 In: 240 [P.O.:240] Out: -      Physical Exam: General: Comfortable, alert, communicative, fully oriented, not short of breath at rest.  HEENT:  No clinical pallor, no jaundice, no conjunctival injection or discharge. Hydration is satisfactory.  NECK:  Supple, JVP not seen, no carotid bruits, no palpable lymphadenopathy, no palpable goiter. CHEST:  Clinically clear to auscultation, no wheezes, no crackles. HEART:  Sounds 1 and 2 heard, normal, irregular, no murmurs. ABDOMEN:  Full, soft, non-tender, no palpable organomegaly, no palpable masses,  normal bowel sounds. GENITALIA:  Not examined. LOWER EXTREMITIES:  No pitting edema, palpable peripheral pulses. MUSCULOSKELETAL SYSTEM:  Generalized osteoarthritic changes, otherwise, normal. CENTRAL NERVOUS SYSTEM:  No focal neurologic deficit on gross examination.  Lab Results:  Recent Labs  03/13/13 2006  WBC 8.9  HGB 16.3*  HCT 45.0  PLT 188    Recent Labs  03/13/13 2006  NA 142  K 3.6  CL 103  CO2 26  GLUCOSE 90  BUN 13  CREATININE 0.56  CALCIUM 9.5   No results found for this or any previous visit (from the past 240 hour(s)).   Studies/Results: Dg Chest 2 View  03/13/2013   CLINICAL DATA:  Left side weakness.  EXAM: CHEST  2 VIEW  COMPARISON:  PA and lateral chest 07/05/2011.  FINDINGS: There is cardiomegaly without edema. The lungs are clear. No pneumothorax or pleural fluid. Surgical clips right breast are noted.  IMPRESSION: Cardiomegaly without acute disease.   Electronically Signed   By: Drusilla Kanner M.D.   On: 03/13/2013 23:21   Ct Head Wo Contrast  03/13/2013   CLINICAL DATA:  Left facial droop, left-sided weakness. Transient ischemic attack.  EXAM: CT HEAD WITHOUT CONTRAST  TECHNIQUE: Contiguous axial images were obtained from the base of the skull through the vertex without intravenous contrast.  COMPARISON:  11/26/2011  FINDINGS: Asymmetric peripherally hypodensity in the right occipital parietal region on image 18 of series 2 is suspicious for a small remote infarct but was not apparent on 11/26/2011. Fortuitous imaging of a sulcus is a less likely differential diagnostic consideration.  Otherwise, the brainstem, cerebellum, cerebral peduncles, thalamus, basal ganglia, basilar cisterns, and ventricular system appear within normal limits. No intracranial hemorrhage, mass lesion, or acute CVA.  IMPRESSION: 1. I suspect a interval but remote small infarct in the right occipital parietal region on image 18 of series 2. This is not thought to be acute. Otherwise  negative exam.   Electronically Signed   By: Herbie Baltimore   On: 03/13/2013 20:54    Medications: Scheduled Meds: . amLODipine  5 mg Oral Daily  . anastrozole  1 mg Oral Daily  . aspirin EC  81 mg Oral Daily  . bisoprolol  2.5 mg Oral Daily  . diltiazem  60 mg Oral Q12H  . DULoxetine  30 mg Oral Daily  . [START ON 03/15/2013] influenza vac split quadrivalent PF  0.5 mL Intramuscular Tomorrow-1000  . irbesartan  300 mg Oral Daily   Continuous Infusions: . sodium chloride 75 mL/hr at 03/14/13 0014   PRN Meds:.    LOS: 1 day   Jaycee Mckellips,CHRISTOPHER  [facility] Pager [phone]. If 8PM-8AM, please contact night-coverage at [email or web address], password [facility] 03/14/2013, 7:23 AM  LOS: 1 day

## 2013-03-14 NOTE — ED Provider Notes (Signed)
I performed a history and physical examination of  Natalie Mendoza and discussed her management with Dr. Modesto Charon. I agree with the history, physical, assessment, and plan of care, with the following exceptions: None I was present for the following procedures: None  Time Spent in Critical Care of the patient: None  Time spent in discussions with the patient and family: 10 minutes  Natalie Mendoza  79 y.o. female with pertinent PMHX of Afib not on anticoagulation who presents to the ED with left sided facial droop, slurred speech and left arm weakness. Sx have resolved, exam non focal. Admit for TIA.   Derwood Kaplan, MD 03/14/13 [phone]

## 2013-03-14 NOTE — Care Management Note (Signed)
    Page 1 of 1   03/15/2013     3:09:00 PM   CARE MANAGEMENT NOTE 03/15/2013  Patient:  ANJELINA, DUNG   Account Number:  [id number]  Date Initiated:  03/14/2013  Documentation initiated by:  Elmer Bales  Subjective/Objective Assessment:   Patient was admitted with left sided weakness, facial droop. CVA. From home alone     Action/Plan:   Will follow for discharge needs.   Anticipated DC Date:  03/15/2013   Anticipated DC Plan:        DC Planning Services  CM consult  OP Neuro Rehab      Choice offered to / List presented to:             Status of service:  Completed, signed off Medicare Important Message given?   (If response is "NO", the following Medicare IM given date fields will be blank) Date Medicare IM given:   Date Additional Medicare IM given:    Discharge Disposition:  HOME/SELF CARE  Per UR Regulation:  Reviewed for med. necessity/level of care/duration of stay  If discussed at Long Length of Stay Meetings, dates discussed:    Comments:  03/15/13 1430 Elmer Bales RN, MSN, CM- Patient's information was faxed to outpatient neuro rehab for speech therapy.  Spoke with neuro rehab to verify the referral.   03/14/13 1430 Elmer Bales RN, MSN, CM- Met with patient to discuss results of benefits check for Eliquis. Patient's copay amount is $142.79/month.  Patient was provided with a $10/month copay card to assist with cost.

## 2013-03-15 MED ORDER — SIMVASTATIN 20 MG PO TABS
20.0000 mg | ORAL_TABLET | Freq: Every day | ORAL | Status: DC
  Filled 2013-03-15: qty 1

## 2013-03-15 MED ORDER — APIXABAN 5 MG PO TABS: 5.0000 mg | ORAL_TABLET | Freq: Two times a day (BID) | ORAL | Status: AC

## 2013-03-15 MED ORDER — DILTIAZEM HCL ER 60 MG PO CP12: 60.0000 mg | ORAL_CAPSULE | Freq: Two times a day (BID) | ORAL | Status: DC

## 2013-03-15 MED ORDER — ZOLPIDEM TARTRATE 5 MG PO TABS: 5.0000 mg | ORAL_TABLET | Freq: Every evening | ORAL | Status: DC | PRN

## 2013-03-15 MED ORDER — SIMVASTATIN 20 MG PO TABS: 20.0000 mg | ORAL_TABLET | Freq: Every day | ORAL | Status: DC

## 2013-03-15 NOTE — Progress Notes (Signed)
ANTICOAGULATION CONSULT NOTE - Follow Up Consult  Pharmacy Consult for Eliquis Indication: atrial fibrillation  No Known Allergies  Patient Measurements: Height: 5\' 5"  (165.1 cm) Weight: 137 lb 2 oz (62.2 kg) IBW/kg (Calculated) : 57   Vital Signs: Temp: 97.9 F (36.6 C) (09/25 1000) Temp src: Oral (09/25 1000) BP: 118/71 mmHg (09/25 1000) Pulse Rate: 63 (09/25 1000)  Labs:  Recent Labs  03/13/13 2006  HGB 16.3*  HCT 45.0  PLT 188  APTT 24  LABPROT 13.0  INR 1.00  CREATININE 0.56  TROPONINI <0.30    Estimated Creatinine Clearance: 53 ml/min (by C-G formula based on Cr of 0.56).   Medical History: Past Medical History  Diagnosis Date  . Hypertension   . Atrial fibrillation   . Pelvic relaxation   . Vaginal pessary present   . Osteoporosis   . Hx of joint problems     Medications:  Scheduled:  . anastrozole  1 mg Oral Daily  . apixaban  5 mg Oral BID  . bisoprolol  2.5 mg Oral Daily  . diltiazem  60 mg Oral Q12H  . DULoxetine  30 mg Oral Daily  . irbesartan  300 mg Oral Daily  . simvastatin  20 mg Oral q1800    Assessment: 79 YOF with known history of AFib who has refused anticoagulation in the past per notes. Admitted for TIA/stroke workup and she presented with left side facial droop, left arm weakness and slurred speech. MRI shows small infarct in right opercular region.  Started Eliquis yesterday for anticoagulation in setting of AFib.  CBC at baseline is ok- Hgb elevated slightly, plts WNL. No bleeding noted. LFTs at baseline also adequate and patient does not have significant hepatic impairment that would prohibit the use of Eliquis. Patient is 79 years old, weights 62.2kg and SCr is 0.56.  Appreciate care management seeing the patient and providing a discount co-pay card in order to make Eliquis more affordable.   Goal of Therapy:  appropriate dosing based on age, weight, and SCr Monitor platelets by anticoagulation protocol: Yes   Plan:   1. Eliquis 5mg  BID base on age, weight and SCr 2. Note: in 3 years when patient is 79, if her SCr is >1.5 OR weight is <60kg, she will need to be transitioned to 2.5mg  BID dosing 3. Follow up CBC for one more day, then pharmacy to sign off if patient is stable  Simcha Farrington D. Malene Blaydes, PharmD Clinical Pharmacist Pager: [phone] 03/15/2013 10:59 AM

## 2013-03-15 NOTE — Progress Notes (Signed)
Patient discharge education and paperwork complete.  Daughter had several questions regarding drug interactions between Eloquis and Zocar that were answered.  Prescriptions given to patient's daughter.  IV removed and patient being driven home by her daughter.  Natalie Mendoza

## 2013-03-15 NOTE — Discharge Summary (Addendum)
Physician Discharge Summary  Natalie Mendoza NWG:[id number] DOB: 08/26/1932 DOA: 03/13/2013  PCP: Default, Provider, MD  Admit date: 03/13/2013 Discharge date: 03/15/2013  Time spent: 40 minutes  Recommendations for Outpatient Follow-up:  1. Follow up with primary MD. 2. Follow up with Dr Delia Heady, stroke MD in 2 months. 3. Outpatient SLP follow up. 4. Follow up at [facility].   Discharge Diagnoses:  Principal Problem:   Left-sided weakness Active Problems:   Breast cancer   TIA (transient ischemic attack)   HTN (hypertension)   Dyslipidemia   Depression   Discharge Condition: Satisfactory.   Diet recommendation: Heart-Healthy.  Filed Weights   03/14/13 0000  Weight: 62.2 kg (137 lb 2 oz)    History of present illness:  79 year old female with past medical history of hypertension, depression, breast cancer s/p lumpectomy (on Anastrozole), osteoporosis, who presented to [facility] ED with sudden onset left sided weakness mostly pronounced in left upper extremity as well as left sided facial droop started around 6:20 pm on 03/13/13. No slurred speech and no lightheadedness, dizziness or loss of consciousness. The symptoms lasted for 15 minutes. No complaints of chest pain, shortness of breath or palpitations. No abdominal pain, nausea or vomiting. No seizure like activity reported. In ED, vitals were stable with BP of 142/89, HR 73 and T 97.9 F. Oxygen saturation was 95% on room air. CT did not reveal acute ischemic event but did show remote infarcts in parietal and occipital areas. BMP and CBC were unremarkable. Admitted for further evaluation and management.    Hospital Course:  1. Left-sided weakness/Acute CVA: Patient with significant cerebrovascular risk factors, presenting with sudden onset left-sided weakness. Head CT scan showed remote small infarcts in right occipital/parietal region but no acute ischemic events. Brain MRI showed acute nonhemorrhagic  infarct right frontal lobe involving the right  frontal operculum region as well as a tiny acute infarct posterior aspect of the  right opercular region. MRA revealed mild to moderate narrowing distal aspect of the M1 segment of the right middle cerebral artery. Decreased number of visualized right middle cerebral artery branch vessels consistent with patient's acute infarct. 2D Echocardiogram showed left ventricular septal and inferobasal hypokinesis, mildly dilated cavity size and EGF of 45% to 50%. LA was mildly to moderately dilated. And there was no atrial septal defect or patent foramen ovale identified. Vascular doppler showed patent vertebral arteries with antegrade flow and findings consistent with 1-39 percent stenosis involving the right internal carotid artery and the left internal carotid artery. Evaluated by PT/OT and  No needs identified on discharge. SLP has evaluated, recommended regular diet, as well as SLP follow up on discharge, for language and cognition. Patient was on low dose ASA, pre-admission. Dr Onalee Hua provided neurology consultation. Anticoagulation with Eliquis was recommended, and commenced accordingly. As of 03/15/13, patient was clinically stable, and cleared by the neurology team for discharge.  2. Atrial fibrillation: This is chronic, and rate-controlled. Admitting MD commenced patient on Cardizem, in place of pre-admission Norvasc. CHADS scores 3, so will benefit from anticoagulation, particularly in view of new event. There appears to be no contraindication. See discussion in #1 above.  3. HTN (hypertension): Reasonably controlled in context of new CVA.  4. Dyslipidemia: Lipid profile showed TC 173, TG 70, HDL 72, LDL 87. Placed on Statin. Target LDL<70.  5. Depression: Stable.  6. Breast cancer: On Anastrozole. Clinically stable. No evidence of local recurrence.    Procedures:  See Below.  2D Echocardiogram.   Consultations: Dr Ritta Slot.     Discharge Exam: Filed Vitals:   03/15/13 1000  BP: 118/71  Pulse: 63  Temp: 97.9 F (36.6 C)  Resp: 18    General: Comfortable, alert, communicative, fully oriented, not short of breath at rest.  HEENT: No clinical pallor, no jaundice, no conjunctival injection or discharge. Hydration is satisfactory.  NECK: Supple, JVP not seen, no carotid bruits, no palpable lymphadenopathy, no palpable goiter.  CHEST: Clinically clear to auscultation, no wheezes, no crackles.  HEART: Sounds 1 and 2 heard, normal, irregular, no murmurs.  ABDOMEN: Full, soft, non-tender, no palpable organomegaly, no palpable masses, normal bowel sounds.  GENITALIA: Not examined.  LOWER EXTREMITIES: No pitting edema, palpable peripheral pulses.  MUSCULOSKELETAL SYSTEM: Generalized osteoarthritic changes, otherwise, normal.  CENTRAL NERVOUS SYSTEM: No focal neurologic deficit on gross examination.  Discharge Instructions      Discharge Orders   Future Orders Complete By Expires   Consult to care management  As directed    Comments:     1. Outpatient SLP follow up.  2. Follow up at [facility].   Diet - low sodium heart healthy  As directed    Increase activity slowly  As directed        Medication List    STOP taking these medications       amLODipine 10 MG tablet  Commonly known as:  NORVASC     aspirin EC 81 MG tablet     OVER THE COUNTER MEDICATION      TAKE these medications       anastrozole 1 MG tablet  Commonly known as:  ARIMIDEX  Take 1 mg by mouth daily.     apixaban 5 MG Tabs tablet  Commonly known as:  ELIQUIS  Take 1 tablet (5 mg total) by mouth 2 (two) times daily.     bisoprolol 5 MG tablet  Commonly known as:  ZEBETA  Take 2.5 mg by mouth daily.     diltiazem 60 MG 12 hr capsule  Commonly known as:  CARDIZEM SR  Take 1 capsule (60 mg total) by mouth every 12 (twelve) hours.     DULoxetine 30 MG capsule  Commonly known as:  CYMBALTA  Take 30 mg by mouth  daily.     Estradiol 10 MCG Tabs vaginal tablet  Pt to insert 1 tablet per vagina 2x/weekly     risedronate 35 MG tablet  Commonly known as:  ACTONEL  Take 1 tablet (35 mg total) by mouth every 7 (seven) days. with water on empty stomach, nothing by mouth or lie down for next 30 minutes.     simvastatin 20 MG tablet  Commonly known as:  ZOCOR  Take 1 tablet (20 mg total) by mouth daily at 6 PM.     valsartan 320 MG tablet  Commonly known as:  DIOVAN  Take 320 mg by mouth daily.     zolpidem 5 MG tablet  Commonly known as:  AMBIEN  Take 1 tablet (5 mg total) by mouth at bedtime as needed for sleep.       No Known Allergies Follow-up Information   Schedule an appointment as soon as possible for a visit to follow up. (Follow up with primary MD)       Follow up with Gates Rigg, MD. Schedule an appointment as soon as possible for a visit in 2 months.   Specialties:  Neurology, Radiology   Contact information:  [address] [place] [address] [phone]       Please follow up. (Follow up at [facility]. )        The results of significant diagnostics from this hospitalization (including imaging, microbiology, ancillary and laboratory) are listed below for reference.    Significant Diagnostic Studies: Dg Chest 2 View  03/13/2013   CLINICAL DATA:  Left side weakness.  EXAM: CHEST  2 VIEW  COMPARISON:  PA and lateral chest 07/05/2011.  FINDINGS: There is cardiomegaly without edema. The lungs are clear. No pneumothorax or pleural fluid. Surgical clips right breast are noted.  IMPRESSION: Cardiomegaly without acute disease.   Electronically Signed   By: Drusilla Kanner M.D.   On: 03/13/2013 23:21   Ct Head Wo Contrast  03/13/2013   CLINICAL DATA:  Left facial droop, left-sided weakness. Transient ischemic attack.  EXAM: CT HEAD WITHOUT CONTRAST  TECHNIQUE: Contiguous axial images were obtained from the base of the skull  through the vertex without intravenous contrast.  COMPARISON:  11/26/2011  FINDINGS: Asymmetric peripherally hypodensity in the right occipital parietal region on image 18 of series 2 is suspicious for a small remote infarct but was not apparent on 11/26/2011. Fortuitous imaging of a sulcus is a less likely differential diagnostic consideration.  Otherwise, the brainstem, cerebellum, cerebral peduncles, thalamus, basal ganglia, basilar cisterns, and ventricular system appear within normal limits. No intracranial hemorrhage, mass lesion, or acute CVA.  IMPRESSION: 1. I suspect a interval but remote small infarct in the right occipital parietal region on image 18 of series 2. This is not thought to be acute. Otherwise negative exam.   Electronically Signed   By: Herbie Baltimore   On: 03/13/2013 20:54   Mr Brain Wo Contrast  03/14/2013   CLINICAL DATA:  Left-sided weakness and facial droop.  EXAM: MRI HEAD WITHOUT CONTRAST  MRA HEAD WITHOUT CONTRAST  TECHNIQUE: Multiplanar, multiecho pulse sequences of the brain and surrounding structures were obtained without intravenous contrast. Angiographic images of the head were obtained using MRA technique without contrast.  COMPARISON:  03/13/2013 CT. 11/24/2004 MR.  FINDINGS: MRI HEAD FINDINGS  Acute nonhemorrhagic infarct right frontal lobe involving the right frontal operculum region. Tiny acute infarct posterior aspect of the right opercular region.  No intracranial hemorrhage.  Remote small right parietal lobe infarct.  Mild to moderate small vessel disease type changes.  No intracranial mass lesion noted on this unenhanced exam.  Global Atrophy without hydrocephalus.  Minimal paranasal sinus mucosal thickening.  Cervical medullary junction, pituitary region, pineal region and orbital structures unremarkable.  Major intracranial vascular structures are patent.  MRA HEAD FINDINGS  Mild to moderate narrowing distal aspect of the M1 segment of the right middle cerebral  artery.  Decrease number of visualized right middle cerebral artery branch vessels consistent with patient's acute infarct.  Left vertebral artery is dominant. Right vertebral artery is small after takeoff of the right PICA.  Nonvisualization left PICA.  Small AICAs  Posterior cerebral artery mild branch vessel irregularity.  No aneurysm noted.  IMPRESSION: MRI HEAD IMPRESSION  Acute nonhemorrhagic infarct right frontal lobe involving the right frontal operculum region. Tiny acute infarct posterior aspect of the right opercular region.  Please see above.  MRA HEAD IMPRESSION  Mild to moderate narrowing distal aspect of the M1 segment of the right middle cerebral artery.  Decrease number of visualized right middle cerebral artery branch vessels consistent with patient's acute infarct.  These results will be called  to the ordering clinician or representative by the Radiologist Assistant, and communication documented in the PACS Dashboard.   Electronically Signed   By: Bridgett Larsson   On: 03/14/2013 08:48   Mr Maxine Glenn Head/brain Wo Cm  03/14/2013   CLINICAL DATA:  Left-sided weakness and facial droop.  EXAM: MRI HEAD WITHOUT CONTRAST  MRA HEAD WITHOUT CONTRAST  TECHNIQUE: Multiplanar, multiecho pulse sequences of the brain and surrounding structures were obtained without intravenous contrast. Angiographic images of the head were obtained using MRA technique without contrast.  COMPARISON:  03/13/2013 CT. 11/24/2004 MR.  FINDINGS: MRI HEAD FINDINGS  Acute nonhemorrhagic infarct right frontal lobe involving the right frontal operculum region. Tiny acute infarct posterior aspect of the right opercular region.  No intracranial hemorrhage.  Remote small right parietal lobe infarct.  Mild to moderate small vessel disease type changes.  No intracranial mass lesion noted on this unenhanced exam.  Global Atrophy without hydrocephalus.  Minimal paranasal sinus mucosal thickening.  Cervical medullary junction, pituitary region,  pineal region and orbital structures unremarkable.  Major intracranial vascular structures are patent.  MRA HEAD FINDINGS  Mild to moderate narrowing distal aspect of the M1 segment of the right middle cerebral artery.  Decrease number of visualized right middle cerebral artery branch vessels consistent with patient's acute infarct.  Left vertebral artery is dominant. Right vertebral artery is small after takeoff of the right PICA.  Nonvisualization left PICA.  Small AICAs  Posterior cerebral artery mild branch vessel irregularity.  No aneurysm noted.  IMPRESSION: MRI HEAD IMPRESSION  Acute nonhemorrhagic infarct right frontal lobe involving the right frontal operculum region. Tiny acute infarct posterior aspect of the right opercular region.  Please see above.  MRA HEAD IMPRESSION  Mild to moderate narrowing distal aspect of the M1 segment of the right middle cerebral artery.  Decrease number of visualized right middle cerebral artery branch vessels consistent with patient's acute infarct.  These results will be called to the ordering clinician or representative by the Radiologist Assistant, and communication documented in the PACS Dashboard.   Electronically Signed   By: Bridgett Larsson   On: 03/14/2013 08:48    Microbiology: No results found for this or any previous visit (from the past 240 hour(s)).   Labs: Basic Metabolic Panel:  Recent Labs Lab 03/13/13 2006  NA 142  K 3.6  CL 103  CO2 26  GLUCOSE 90  BUN 13  CREATININE 0.56  CALCIUM 9.5   Liver Function Tests:  Recent Labs Lab 03/13/13 2006  AST 31  ALT 41*  ALKPHOS 65  BILITOT 1.6*  PROT 7.2  ALBUMIN 4.4   No results found for this basename: LIPASE, AMYLASE,  in the last 168 hours No results found for this basename: AMMONIA,  in the last 168 hours CBC:  Recent Labs Lab 03/13/13 2006  WBC 8.9  NEUTROABS 6.2  HGB 16.3*  HCT 45.0  MCV 91.1  PLT 188   Cardiac Enzymes:  Recent Labs Lab 03/13/13 2006  TROPONINI  <0.30   BNP: BNP (last 3 results) No results found for this basename: PROBNP,  in the last 8760 hours CBG: No results found for this basename: GLUCAP,  in the last 168 hours     Signed:  Jovita Persing,CHRISTOPHER  [facility] 03/15/2013, 12:03 PM

## 2013-03-15 NOTE — Progress Notes (Signed)
Stroke Team Progress Note  HISTORY Natalie Mendoza is a 79 y.o. female with a history of atrial fibrillation for many years who presented 03/13/2013 with transient left-sided face and arm weakness that lasted about 15 minutes. She was talking with her son, who called his father and said something was wrong. She dropped several items from her hand. Her son arrived to find her with left-sided facial droop. This resolved over approximately 15 minutes. She stated that she didn't really remember these problems. She was able walk while symptomatic.  Patient was not a TPA candidate secondary to early resolution of symptoms.  SUBJECTIVE  No new symptoms.. Should go home today.   OBJECTIVE Most recent Vital Signs: Filed Vitals:   03/14/13 1350 03/14/13 2200 03/15/13 0157 03/15/13 0606  BP: 136/83 146/100 140/87 150/92  Pulse: 66 79 91 68  Temp: 98.4 F (36.9 C) 98.7 F (37.1 C) 99 F (37.2 C) 98.9 F (37.2 C)  TempSrc: Oral Oral Oral Oral  Resp: 18 20 20 18   Height:      Weight:      SpO2: 91% 94% 91% 97%   CBG (last 3)  No results found for this basename: GLUCAP,  in the last 72 hours  IV Fluid Intake:      MEDICATIONS  . amLODipine  5 mg Oral Daily  . anastrozole  1 mg Oral Daily  . apixaban  5 mg Oral BID  . bisoprolol  2.5 mg Oral Daily  . diltiazem  60 mg Oral Q12H  . DULoxetine  30 mg Oral Daily  . influenza vac split quadrivalent PF  0.5 mL Intramuscular Tomorrow-1000  . irbesartan  300 mg Oral Daily   PRN:    Diet:  General thin liquids Activity:  As tolerated DVT Prophylaxis:  SCDs  CLINICALLY SIGNIFICANT STUDIES Basic Metabolic Panel:   Recent Labs Lab 03/13/13 2006  NA 142  K 3.6  CL 103  CO2 26  GLUCOSE 90  BUN 13  CREATININE 0.56  CALCIUM 9.5   Liver Function Tests:   Recent Labs Lab 03/13/13 2006  AST 31  ALT 41*  ALKPHOS 65  BILITOT 1.6*  PROT 7.2  ALBUMIN 4.4   CBC:   Recent Labs Lab 03/13/13 2006  WBC 8.9  NEUTROABS 6.2  HGB  16.3*  HCT 45.0  MCV 91.1  PLT 188   Coagulation:   Recent Labs Lab 03/13/13 2006  LABPROT 13.0  INR 1.00   Cardiac Enzymes:   Recent Labs Lab 03/13/13 2006  TROPONINI <0.30   Urinalysis: No results found for this basename: COLORURINE, APPERANCEUR, LABSPEC, PHURINE, GLUCOSEU, HGBUR, BILIRUBINUR, KETONESUR, PROTEINUR, UROBILINOGEN, NITRITE, LEUKOCYTESUR,  in the last 168 hours Lipid Panel    Component Value Date/Time   CHOL 173 03/14/2013 0715   TRIG 70 03/14/2013 0715   HDL 72 03/14/2013 0715   CHOLHDL 2.4 03/14/2013 0715   VLDL 14 03/14/2013 0715   LDLCALC 87 03/14/2013 0715   HgbA1C  Lab Results  Component Value Date   HGBA1C 6.1* 03/14/2013    Urine Drug Screen:   No results found for this basename: labopia,  cocainscrnur,  labbenz,  amphetmu,  thcu,  labbarb    Alcohol Level:   Recent Labs Lab 03/13/13 2006  ETH <11    Dg Chest 2 View 03/13/2013 Cardiomegaly without acute disease.     Ct Head Wo Contrast 03/13/2013 1. I suspect a interval but remote small infarct in the right occipital parietal region on image  18 of series 2. This is not thought to be acute. Otherwise negative exam.    Mr Brain Wo Contrast 03/14/2013   Acute nonhemorrhagic infarct right frontal lobe involving the right frontal operculum region. Tiny acute infarct posterior aspect of the right opercular region.      MRA HEAD Mild to moderate narrowing distal aspect of the M1 segment of the right middle cerebral artery.  Decrease number of visualized right middle cerebral artery branch vessels consistent with patient's acute infarct.     2D Echocardiogram  EF 45%, left atrium milld-mod dilated, no ASD or PFO identified  Carotid Doppler  Preliminary findings: Bilateral: 1-39% ICA stenosis. Antegrade vertebral flow.   EKG atrial fibrillation. A ventricular response 87 beats per minute.  Therapy Recommendations outpt speech  Physical Exam   Mental Status:  Patient is awake, alert,  oriented to person, place, month, year, and situation.  Immediate and remote memory are intact.  Patient is able to give a clear and coherent history.  No signs of aphasia or neglect  Cranial Nerves:  II: Visual Fields are full. Pupils are equal, round, and reactive to light. Discs are sharp  III,IV, VI: EOMI without ptosis or diploplia.  V: Facial sensation is symmetric to temperature  VII: Facial movement is symmetric.  VIII: hearing is intact to voice  X: Uvula elevates symmetrically  XI: Shoulder shrug is symmetric.  XII: tongue is midline without atrophy or fasciculations.  Motor:  Tone is normal. Bulk is normal. 5/5 strength was present in all four extremities.  Sensory:  Sensation is symmetric to light touch and temperature in the arms and legs.  Deep Tendon Reflexes:  2+ and symmetric in the biceps and patellae.  Plantars:  Toes are downgoing bilaterally.  Cerebellar:  FNF intact bilaterally  Gait:  Patient has a stable casual gait. Able to tandem, heel, and toe walk.   ASSESSMENT [name] is a 79 y.o. female presenting with transient left arm and left face weakness which lasted approximately 15 minutes. TPA was not given secondary to resolution of symptoms prior to arrival. An MRI revealed an acute nonhemorrhagic infarct right frontal lobe involving the right frontal operculum region. Tiny acute infarct posterior aspect of the right opercular region. Infarcts felt to be embolic secondary to atrial fibrillation. On aspirin 81 mg orally every day prior to admission. Now on Eliquis for secondary stroke prevention. Patient with resultant resolution of deficits. Work up underway.   Atrial fibrillation with controlled ventricular response.  Hypertension   LDL 87 at goal, no indication for statin.  Depression  Breast cancer  hgb a1c, 6.1  Hospital day # 2  TREATMENT/PLAN  Continue eliquis as directed  Outpatient speech deemed necessary  Risk factor  modifications  Stroke service to sign off  Have patient to make appointment to see Dr. Pearlean Mendoza in 2 months in stroke clinic  Natalie Mendoza, [facility], MBA, MHA [facility] Pager: [phone] 03/15/2013 9:47 AM  I have personally obtained a history, examined the patient, evaluated imaging results, and formulated the assessment and plan of care. I agree with the above.  Delia Heady, MD

## 2013-03-28 ENCOUNTER — Ambulatory Visit: Payer: Medicare Other | Admitting: Speech Pathology

## 2013-08-08 ENCOUNTER — Emergency Department (HOSPITAL_BASED_OUTPATIENT_CLINIC_OR_DEPARTMENT_OTHER): Payer: Medicare Other

## 2013-08-08 ENCOUNTER — Emergency Department (HOSPITAL_BASED_OUTPATIENT_CLINIC_OR_DEPARTMENT_OTHER)
Admission: EM | Admit: 2013-08-08 | Discharge: 2013-08-08 | Disposition: A | Discharge status: Home / Self Care | Payer: Medicare Other | Attending: Emergency Medicine | Admitting: Emergency Medicine

## 2013-08-08 ENCOUNTER — Encounter (HOSPITAL_BASED_OUTPATIENT_CLINIC_OR_DEPARTMENT_OTHER): Payer: Self-pay | Admitting: Emergency Medicine

## 2013-08-08 DIAGNOSIS — Y9389 Activity, other specified: Secondary | ICD-10-CM | POA: Insufficient documentation

## 2013-08-08 DIAGNOSIS — Z7901 Long term (current) use of anticoagulants: Secondary | ICD-10-CM | POA: Insufficient documentation

## 2013-08-08 DIAGNOSIS — Z8742 Personal history of other diseases of the female genital tract: Secondary | ICD-10-CM | POA: Insufficient documentation

## 2013-08-08 DIAGNOSIS — G9389 Other specified disorders of brain: Secondary | ICD-10-CM | POA: Insufficient documentation

## 2013-08-08 DIAGNOSIS — Y929 Unspecified place or not applicable: Secondary | ICD-10-CM | POA: Insufficient documentation

## 2013-08-08 DIAGNOSIS — S52609A Unspecified fracture of lower end of unspecified ulna, initial encounter for closed fracture: Secondary | ICD-10-CM | POA: Insufficient documentation

## 2013-08-08 DIAGNOSIS — I4891 Unspecified atrial fibrillation: Secondary | ICD-10-CM | POA: Insufficient documentation

## 2013-08-08 DIAGNOSIS — Z79899 Other long term (current) drug therapy: Secondary | ICD-10-CM | POA: Insufficient documentation

## 2013-08-08 DIAGNOSIS — S6990XA Unspecified injury of unspecified wrist, hand and finger(s), initial encounter: Secondary | ICD-10-CM | POA: Insufficient documentation

## 2013-08-08 DIAGNOSIS — I10 Essential (primary) hypertension: Secondary | ICD-10-CM | POA: Insufficient documentation

## 2013-08-08 DIAGNOSIS — W06XXXA Fall from bed, initial encounter: Secondary | ICD-10-CM | POA: Insufficient documentation

## 2013-08-08 DIAGNOSIS — Z8739 Personal history of other diseases of the musculoskeletal system and connective tissue: Secondary | ICD-10-CM | POA: Insufficient documentation

## 2013-08-08 MED ORDER — HYDROCODONE-ACETAMINOPHEN 5-325 MG PO TABS: 2.0000 | ORAL_TABLET | ORAL | Status: DC | PRN

## 2013-08-08 MED ORDER — ASPIRIN 81 MG PO CHEW
CHEWABLE_TABLET | ORAL | Status: AC
  Filled 2013-08-08: qty 4

## 2013-08-08 MED ORDER — HYDROCODONE-ACETAMINOPHEN 5-325 MG PO TABS
1.0000 | ORAL_TABLET | Freq: Once | ORAL | Status: AC
  Administered 2013-08-08: 1 via ORAL
  Filled 2013-08-08: qty 1

## 2013-08-08 NOTE — ED Provider Notes (Signed)
CSN: [id number]     Arrival date & time 08/08/13  1012 History   First MD Initiated Contact with Patient 08/08/13 1034     Chief Complaint  Patient presents with  . Wrist Injury  . Hand Injury     (Consider location/radiation/quality/duration/timing/severity/associated sxs/prior Treatment) HPI Comments: Patient was trying to lean over in bed to turn off her light last night around 4 AM and rolled onto the floor hurting her right wrist and hand. She denies hitting her head or losing consciousness. She denies pain anywhere other than her hand and wrist. She has a previous wrist fracture in this hand. She denies any focal weakness, numbness or tingling. She is on eliquis for history of atrial fibrillation. She denies any head, neck, back, chest or abdominal pain. She denies any dizziness or passing out.  The history is provided by the patient.    Past Medical History  Diagnosis Date  . Hypertension   . Atrial fibrillation   . Pelvic relaxation   . Vaginal pessary present   . Osteoporosis   . Hx of joint problems    Past Surgical History  Procedure Laterality Date  . Breast lumpectomy     No family history on file. History  Substance Use Topics  . Smoking status: Never Smoker   . Smokeless tobacco: Never Used  . Alcohol Use: No   OB History   Grav Para Term Preterm Abortions TAB SAB Ect Mult Living   2 2             Review of Systems  Constitutional: Negative for activity change and appetite change.  Respiratory: Negative for cough, chest tightness and shortness of breath.   Cardiovascular: Negative for chest pain.  Gastrointestinal: Negative for nausea, vomiting and abdominal pain.  Genitourinary: Negative for dysuria and hematuria.  Musculoskeletal: Positive for arthralgias and myalgias. Negative for back pain and neck pain.  Skin: Negative for rash.  Neurological: Negative for dizziness and headaches.  A complete 10 system review of systems was obtained and all  systems are negative except as noted in the HPI and PMH.      Allergies  Review of patient's allergies indicates no known allergies.  Home Medications   Current Outpatient Rx  Name  Route  Sig  Dispense  Refill  . anastrozole (ARIMIDEX) 1 MG tablet   Oral   Take 1 mg by mouth daily.         Marland Kitchen apixaban (ELIQUIS) 5 MG TABS tablet   Oral   Take 1 tablet (5 mg total) by mouth 2 (two) times daily.   60 tablet   0   . bisoprolol (ZEBETA) 5 MG tablet   Oral   Take 5 mg by mouth daily.          Marland Kitchen diltiazem (CARDIZEM SR) 60 MG 12 hr capsule   Oral   Take 1 capsule (60 mg total) by mouth every 12 (twelve) hours.   60 capsule   0   . DULoxetine (CYMBALTA) 30 MG capsule   Oral   Take 30 mg by mouth daily.         . Estradiol 10 MCG TABS      Pt to insert 1 tablet per vagina 2x/weekly   8 tablet   12   . HYDROcodone-acetaminophen (NORCO/VICODIN) 5-325 MG per tablet   Oral   Take 2 tablets by mouth every 4 (four) hours as needed.   10 tablet   0   .  risedronate (ACTONEL) 35 MG tablet   Oral   Take 1 tablet (35 mg total) by mouth every 7 (seven) days. with water on empty stomach, nothing by mouth or lie down for next 30 minutes.   4 tablet   12   . simvastatin (ZOCOR) 20 MG tablet   Oral   Take 1 tablet (20 mg total) by mouth daily at 6 PM.   30 tablet   0   . valsartan (DIOVAN) 320 MG tablet   Oral   Take 320 mg by mouth daily.         Marland Kitchen zolpidem (AMBIEN) 5 MG tablet   Oral   Take 1 tablet (5 mg total) by mouth at bedtime as needed for sleep.   30 tablet   0    BP 127/81  Pulse 59  Temp(Src) 98.3 F (36.8 C) (Oral)  Resp 18  Ht 5\' 5"  (1.651 m)  Wt 150 lb (68.04 kg)  BMI 24.96 kg/m2  SpO2 99% Physical Exam  Constitutional: She is oriented to person, place, and time. She appears well-developed and well-nourished. No distress.  HENT:  Head: Normocephalic and atraumatic.  Mouth/Throat: Oropharynx is clear and moist. No oropharyngeal exudate.   Eyes: Conjunctivae and EOM are normal. Pupils are equal, round, and reactive to light.  Neck: Normal range of motion. Neck supple.  No C spine tenderness  Cardiovascular: Normal rate, regular rhythm and normal heart sounds.   No murmur heard. Pulmonary/Chest: Effort normal and breath sounds normal. No respiratory distress.  Abdominal: Soft. There is no tenderness. There is no rebound and no guarding.  Musculoskeletal: Normal range of motion. She exhibits tenderness. She exhibits no edema.  FROM hips without pain.  No T or L spine pain  Ecchymosis to the posterior right hand. +2 radial pulse, crepitance to ulnar aspect of left wrist. Cardinal hand movements intact. No obvious deformity.  Neurological: She is alert and oriented to person, place, and time. No cranial nerve deficit. She exhibits normal muscle tone. Coordination normal.  CN 2-12 intact, no ataxia on finger to nose, no nystagmus, 5/5 strength throughout, no pronator drift, Romberg negative, normal gait.   Skin: Skin is warm.    ED Course  Procedures (including critical care time) Labs Review Labs Reviewed - No data to display Imaging Review Dg Wrist Complete Right  08/08/2013   CLINICAL DATA:  Fall with swelling and bruising  EXAM: RIGHT WRIST - COMPLETE 3+ VIEW  COMPARISON:  None.  FINDINGS: There is an oblique fracture through the distal ulnar which is best seen on dedicated hand imaging. The fracture extends from the posterior metaphysis to the distal epiphysis. The injury is age indeterminate, although favor acute injury given the presentation.  Focal degenerative narrowing at the STT joint.  Osteopenia.  IMPRESSION: Oblique distal ulnar fracture, favor acute.   Electronically Signed   By: Jorje Guild M.D.   On: 08/08/2013 10:37   Ct Head Wo Contrast  08/08/2013   CLINICAL DATA:  Status post fall, possible head injury, on anticoagulant is  EXAM: CT HEAD WITHOUT CONTRAST  TECHNIQUE: Contiguous axial images were obtained  from the base of the skull through the vertex without intravenous contrast.  COMPARISON:  MR HEAD W/O CM dated 03/14/2013; CT HEAD W/O CM dated 03/13/2013  FINDINGS: The ventricles are normal in size and position. There is mild age appropriate diffuse cerebral and cerebellar atrophy. There is an focal area of right frontal encephalomalacia at the site of the previously  demonstrated acute ischemic infarction on the MRI of March 14, 2013. There is no evidence of an acute intracranial hemorrhage nor of an evolving ischemic infarction. There is decreased density in the deep white matter of both cerebral hemispheres which is stable and consistent with chronic small vessel ischemic type change. There is an old right parieto-occipital cortical infarct unchanged since the CT scan of June 12, 2013. The cerebellum and brainstem exhibit no acute abnormalities.  At bone window settings the observed portions of the paranasal sinuses and mastoid air cells are clear. There is no evidence of an acute skull fracture.  IMPRESSION: 1. There is no evidence of an acute intracranial hemorrhage. 2. There is no evidence of an acute ischemic or hemorrhagic infarction. 3. Areas of encephalomalacia in the right frontal and right parieto-occipital lobes are stable and consistent with previous ischemic infarctions. 4. There is mild diffuse cerebral and cerebellar atrophy consistent with chronic small vessel ischemic change of aging.   Electronically Signed   By: David  Martinique   On: 08/08/2013 11:14   Dg Hand Complete Right  08/08/2013   CLINICAL DATA:  Fall with hand injury, pain, swelling.  EXAM: RIGHT HAND - COMPLETE 3+ VIEW  COMPARISON:  None.  FINDINGS: There is cortical disruption involving the distal ulna, extending from the posterior metaphysis towards the central epiphysis. There is mild soft tissue swelling in region, favor acute, minimally displaced fracture. No evidence of acute fracture or dislocation in the hand. There is  diffuse interphalangeal joint osteoarthritis. STT degenerative joint narrowing. Osteopenia.  IMPRESSION: Oblique distal ulnar fracture, favored acute.   Electronically Signed   By: Jorje Guild M.D.   On: 08/08/2013 10:47    EKG Interpretation   None       MDM   Final diagnoses:  Distal end of ulna fracture, closed    Fall from bed with right hand and wrist injury. We'll obtain CT scan given history of anticoagulant use.  Neurovascularly intact. Intact radial pulse.  X-ray shows oblique distal ulna fracture. No displacement. CT head is negative for any acute pathology. Patient will be placed in a splint. I discussed this with Dr. Levell July nurse and patient will call for appointment tomorrow. Return precautions discussed.     Ezequiel Essex, MD 08/08/13 1550

## 2013-08-08 NOTE — Discharge Instructions (Signed)
Forearm Fracture Call Dr. Fredna Dow today. Wear the splint. Return to the ED if you develop new or worsening symptoms. Your caregiver has diagnosed you as having a broken bone (fracture) of the forearm. This is the part of your arm between the elbow and your wrist. Your forearm is made up of two bones. These are the radius and ulna. A fracture is a break in one or both bones. A cast or splint is used to protect and keep your injured bone from moving. The cast or splint will be on generally for about 5 to 6 weeks, with individual variations. HOME CARE INSTRUCTIONS   Keep the injured part elevated while sitting or lying down. Keeping the injury above the level of your heart (the center of the chest). This will decrease swelling and pain.  Apply ice to the injury for 15-20 minutes, 03-04 times per day while awake, for 2 days. Put the ice in a plastic bag and place a thin towel between the bag of ice and your cast or splint.  If you have a plaster or fiberglass cast:  Do not try to scratch the skin under the cast using sharp or pointed objects.  Check the skin around the cast every day. You may put lotion on any red or sore areas.  Keep your cast dry and clean.  If you have a plaster splint:  Wear the splint as directed.  You may loosen the elastic around the splint if your fingers become numb, tingle, or turn cold or blue.  Do not put pressure on any part of your cast or splint. It may break. Rest your cast only on a pillow the first 24 hours until it is fully hardened.  Your cast or splint can be protected during bathing with a plastic bag. Do not lower the cast or splint into water.  Only take over-the-counter or prescription medicines for pain, discomfort, or fever as directed by your caregiver. SEEK IMMEDIATE MEDICAL CARE IF:   Your cast gets damaged or breaks.  You have more severe pain or swelling than you did before the cast.  Your skin or nails below the injury turn blue or gray,  or feel cold or numb.  There is a bad smell or new stains and/or pus like (purulent) drainage coming from under the cast. MAKE SURE YOU:   Understand these instructions.  Will watch your condition.  Will get help right away if you are not doing well or get worse. Document Released: 06/04/2000 Document Revised: 08/30/2011 Document Reviewed: 01/25/2008 [facility] Patient Information 2014 [place].

## 2013-08-08 NOTE — ED Notes (Signed)
Pt sts she was leaning out of the bed to turn on light at 4am and fell onto floor landing on right wrist and hand. Pt has hematoma to posterior hand, CMS intact. Radial pulse strong.

## 2013-08-28 DIAGNOSIS — M8080XA Other osteoporosis with current pathological fracture, unspecified site, initial encounter for fracture: Secondary | ICD-10-CM | POA: Insufficient documentation

## 2014-03-29 DIAGNOSIS — H268 Other specified cataract: Secondary | ICD-10-CM | POA: Insufficient documentation

## 2014-03-29 DIAGNOSIS — H2513 Age-related nuclear cataract, bilateral: Secondary | ICD-10-CM | POA: Insufficient documentation

## 2014-04-22 ENCOUNTER — Encounter (HOSPITAL_BASED_OUTPATIENT_CLINIC_OR_DEPARTMENT_OTHER): Payer: Self-pay | Admitting: Emergency Medicine

## 2014-05-02 ENCOUNTER — Other Ambulatory Visit: Payer: Self-pay | Admitting: Dermatology

## 2014-05-28 ENCOUNTER — Other Ambulatory Visit: Payer: Self-pay | Admitting: Family Medicine

## 2014-05-28 ENCOUNTER — Ambulatory Visit
Admission: RE | Admit: 2014-05-28 | Discharge: 2014-05-28 | Disposition: A | Discharge status: Home / Self Care | Payer: Medicare Other | Source: Ambulatory Visit | Attending: Family Medicine | Admitting: Family Medicine

## 2014-05-28 DIAGNOSIS — M549 Dorsalgia, unspecified: Secondary | ICD-10-CM

## 2014-09-27 ENCOUNTER — Emergency Department (HOSPITAL_BASED_OUTPATIENT_CLINIC_OR_DEPARTMENT_OTHER)
Admission: EM | Admit: 2014-09-27 | Discharge: 2014-09-27 | Disposition: A | Discharge status: Home / Self Care | Payer: Medicare Other | Attending: Emergency Medicine | Admitting: Emergency Medicine

## 2014-09-27 ENCOUNTER — Emergency Department (HOSPITAL_BASED_OUTPATIENT_CLINIC_OR_DEPARTMENT_OTHER): Payer: Medicare Other

## 2014-09-27 ENCOUNTER — Encounter (HOSPITAL_BASED_OUTPATIENT_CLINIC_OR_DEPARTMENT_OTHER): Payer: Self-pay | Admitting: Emergency Medicine

## 2014-09-27 DIAGNOSIS — Z79899 Other long term (current) drug therapy: Secondary | ICD-10-CM | POA: Diagnosis not present

## 2014-09-27 DIAGNOSIS — I10 Essential (primary) hypertension: Secondary | ICD-10-CM | POA: Insufficient documentation

## 2014-09-27 DIAGNOSIS — Z7901 Long term (current) use of anticoagulants: Secondary | ICD-10-CM | POA: Diagnosis not present

## 2014-09-27 DIAGNOSIS — Z8739 Personal history of other diseases of the musculoskeletal system and connective tissue: Secondary | ICD-10-CM | POA: Diagnosis not present

## 2014-09-27 DIAGNOSIS — Z792 Long term (current) use of antibiotics: Secondary | ICD-10-CM | POA: Insufficient documentation

## 2014-09-27 DIAGNOSIS — N390 Urinary tract infection, site not specified: Secondary | ICD-10-CM | POA: Insufficient documentation

## 2014-09-27 DIAGNOSIS — R0602 Shortness of breath: Secondary | ICD-10-CM | POA: Diagnosis present

## 2014-09-27 LAB — BASIC METABOLIC PANEL
Anion gap: 10 (ref 5–15)
BUN: 24 mg/dL — AB (ref 6–23)
CHLORIDE: 102 mmol/L (ref 96–112)
CO2: 27 mmol/L (ref 19–32)
Calcium: 10 mg/dL (ref 8.4–10.5)
Creatinine, Ser: 0.58 mg/dL (ref 0.50–1.10)
GFR calc Af Amer: >90 mL/min (ref >90)
GFR, EST NON AFRICAN AMERICAN: 85 mL/min — AB (ref >90)
Glucose, Bld: 118 mg/dL — ABNORMAL HIGH (ref 70–99)
Potassium: 4 mmol/L (ref 3.5–5.1)
Sodium: 139 mmol/L (ref 135–145)

## 2014-09-27 LAB — URINALYSIS, ROUTINE W REFLEX MICROSCOPIC
BILIRUBIN URINE: NEGATIVE
Glucose, UA: NEGATIVE mg/dL
KETONES UR: NEGATIVE mg/dL
Nitrite: POSITIVE — AB
Protein, ur: NEGATIVE mg/dL
SPECIFIC GRAVITY, URINE: 1.006 (ref 1.005–1.030)
Urobilinogen, UA: 0.2 mg/dL (ref 0.0–1.0)
pH: 6.5 (ref 5.0–8.0)

## 2014-09-27 LAB — CBC WITH DIFFERENTIAL/PLATELET
BASOS ABS: 0 10*3/uL (ref 0.0–0.1)
Basophils Relative: 0 % (ref 0–1)
Eosinophils Absolute: 0.3 10*3/uL (ref 0.0–0.7)
Eosinophils Relative: 3 % (ref 0–5)
HEMATOCRIT: 46.9 % — AB (ref 36.0–46.0)
HEMOGLOBIN: 15.9 g/dL — AB (ref 12.0–15.0)
LYMPHS ABS: 2.1 10*3/uL (ref 0.7–4.0)
LYMPHS PCT: 26 % (ref 12–46)
MCH: 31.3 pg (ref 26.0–34.0)
MCHC: 33.9 g/dL (ref 30.0–36.0)
MCV: 92.3 fL (ref 78.0–100.0)
MONO ABS: 1 10*3/uL (ref 0.1–1.0)
MONOS PCT: 12 % (ref 3–12)
NEUTROS ABS: 4.8 10*3/uL (ref 1.7–7.7)
NEUTROS PCT: 59 % (ref 43–77)
Platelets: 195 10*3/uL (ref 150–400)
RBC: 5.08 MIL/uL (ref 3.87–5.11)
RDW: 13.4 % (ref 11.5–15.5)
WBC: 8.1 10*3/uL (ref 4.0–10.5)

## 2014-09-27 LAB — URINE MICROSCOPIC-ADD ON

## 2014-09-27 LAB — TROPONIN I: Troponin I: <0.03 ng/mL (ref <0.031)

## 2014-09-27 LAB — BRAIN NATRIURETIC PEPTIDE: B Natriuretic Peptide: 266.2 pg/mL — ABNORMAL HIGH (ref 0.0–100.0)

## 2014-09-27 MED ORDER — DEXTROSE 5 % IV SOLN
1.0000 g | Freq: Once | INTRAVENOUS | Status: AC
  Administered 2014-09-27: 1 g via INTRAVENOUS

## 2014-09-27 MED ORDER — OXYCODONE-ACETAMINOPHEN 5-325 MG PO TABS: 1.0000 | ORAL_TABLET | Freq: Four times a day (QID) | ORAL | Status: DC | PRN

## 2014-09-27 MED ORDER — CEFTRIAXONE SODIUM 1 G IJ SOLR
INTRAMUSCULAR | Status: AC
  Filled 2014-09-27: qty 10

## 2014-09-27 MED ORDER — CEPHALEXIN 500 MG PO CAPS: 500.0000 mg | ORAL_CAPSULE | Freq: Two times a day (BID) | ORAL | Status: DC

## 2014-09-27 NOTE — ED Provider Notes (Signed)
CSN: [id number]     Arrival date & time 09/27/14  1043 History   First MD Initiated Contact with Patient 09/27/14 1050     Chief Complaint  Patient presents with  . Shortness of Breath     (Consider location/radiation/quality/duration/timing/severity/associated sxs/prior Treatment) HPI  This is a 81 year old female with a history of hypertension, atrial fibrillation who presents with shortness of breath and altered mental status. Per the patient's family and her family, last night she was unable to get comfortable and felt short of breath all night. No fevers.  Family is concerned because she's been confused today. Patient states that she feels like she is confused at home and "gets off track easily."  He did not sleep very well last night. Patient has a history of atrial fibrillation and is on ". She was seen yesterday by her primary OB/GYN for uterine prolapse that is worsening. She was found to have it irritation secondary to her pelvic ring and was given a topical antibiotic. Patient denies any fevers or cough. She denies any leg swelling. Denies any chest pain, nausea, vomiting, urinary symptoms.  Past Medical History  Diagnosis Date  . Hypertension   . Atrial fibrillation   . Pelvic relaxation   . Vaginal pessary present   . Osteoporosis   . Hx of joint problems    Past Surgical History  Procedure Laterality Date  . Breast lumpectomy     No family history on file. History  Substance Use Topics  . Smoking status: Never Smoker   . Smokeless tobacco: Never Used  . Alcohol Use: No   OB History    Gravida Para Term Preterm AB TAB SAB Ectopic Multiple Living   2 2             Review of Systems  Constitutional: Negative for fever.  Respiratory: Positive for shortness of breath. Negative for cough and chest tightness.   Cardiovascular: Negative for chest pain.  Gastrointestinal: Negative for nausea, vomiting and abdominal pain.  Genitourinary: Positive for vaginal pain.  Negative for dysuria.  Musculoskeletal: Negative for back pain.  Neurological: Negative for headaches.  Psychiatric/Behavioral: Positive for confusion.  All other systems reviewed and are negative.     Allergies  Review of patient's allergies indicates no known allergies.  Home Medications   Prior to Admission medications   Medication Sig Start Date End Date Taking? Authorizing Provider  anastrozole (ARIMIDEX) 1 MG tablet Take 1 mg by mouth daily.   Yes Historical Provider, MD  apixaban (ELIQUIS) 5 MG TABS tablet Take 1 tablet (5 mg total) by mouth 2 (two) times daily. 03/15/13  Yes Monika Salk, MD  bisoprolol (ZEBETA) 5 MG tablet Take 5 mg by mouth daily.    Yes Historical Provider, MD  DULoxetine (CYMBALTA) 30 MG capsule Take 30 mg by mouth daily.   Yes Historical Provider, MD  Estradiol 10 MCG TABS Pt to insert 1 tablet per vagina 2x/weekly 09/01/12  Yes Eldred Manges, MD  risedronate (ACTONEL) 35 MG tablet Take 1 tablet (35 mg total) by mouth every 7 (seven) days. with water on empty stomach, nothing by mouth or lie down for next 30 minutes. 09/01/12  Yes Eldred Manges, MD  valsartan (DIOVAN) 320 MG tablet Take 320 mg by mouth daily.   Yes Historical Provider, MD  cephALEXin (KEFLEX) 500 MG capsule Take 1 capsule (500 mg total) by mouth 2 (two) times daily. 09/27/14   Merryl Hacker, MD  diltiazem (CARDIZEM SR)  60 MG 12 hr capsule Take 1 capsule (60 mg total) by mouth every 12 (twelve) hours. 03/15/13   Monika Salk, MD  HYDROcodone-acetaminophen (NORCO/VICODIN) 5-325 MG per tablet Take 2 tablets by mouth every 4 (four) hours as needed. 08/08/13   Ezequiel Essex, MD  oxyCODONE-acetaminophen (PERCOCET/ROXICET) 5-325 MG per tablet Take 1 tablet by mouth every 6 (six) hours as needed for severe pain. 09/27/14   Merryl Hacker, MD  simvastatin (ZOCOR) 20 MG tablet Take 1 tablet (20 mg total) by mouth daily at 6 PM. 03/15/13   Monika Salk, MD  zolpidem (AMBIEN) 5 MG tablet Take 1  tablet (5 mg total) by mouth at bedtime as needed for sleep. 03/15/13   Monika Salk, MD   BP 133/83 mmHg  Pulse 54  Temp(Src) 98 F (36.7 C) (Oral)  Resp 15  Ht 5\' 5"  (1.651 m)  Wt 150 lb (68.04 kg)  BMI 24.96 kg/m2  SpO2 94% Physical Exam  Constitutional: No distress.  Elderly  HENT:  Head: Normocephalic and atraumatic.  Eyes: Pupils are equal, round, and reactive to light.  Cardiovascular: Normal rate and normal heart sounds.   No murmur heard. Irregular rhythm  Pulmonary/Chest: Effort normal and breath sounds normal. No respiratory distress. She has no wheezes.  Abdominal: Soft. Bowel sounds are normal. There is no tenderness. There is no rebound and no guarding.  Musculoskeletal: She exhibits no edema.  Neurological: She is alert.  Oriented 3 but tangential in speech, difficult to obtain history from  Skin: Skin is warm and dry.  Psychiatric: She has a normal mood and affect.  Nursing note and vitals reviewed.   ED Course  Procedures (including critical care time) Labs Review Labs Reviewed  CBC WITH DIFFERENTIAL/PLATELET - Abnormal; Notable for the following:    Hemoglobin 15.9 (*)    HCT 46.9 (*)    All other components within normal limits  BASIC METABOLIC PANEL - Abnormal; Notable for the following:    Glucose, Bld 118 (*)    BUN 24 (*)    GFR calc non Af Amer 85 (*)    All other components within normal limits  BRAIN NATRIURETIC PEPTIDE - Abnormal; Notable for the following:    B Natriuretic Peptide 266.2 (*)    All other components within normal limits  URINALYSIS, ROUTINE W REFLEX MICROSCOPIC - Abnormal; Notable for the following:    APPearance CLOUDY (*)    Hgb urine dipstick SMALL (*)    Nitrite POSITIVE (*)    Leukocytes, UA LARGE (*)    All other components within normal limits  URINE MICROSCOPIC-ADD ON - Abnormal; Notable for the following:    Bacteria, UA MANY (*)    All other components within normal limits  URINE CULTURE  TROPONIN I     Imaging Review Dg Chest 2 View  09/27/2014   CLINICAL DATA:  Shortness of breath and confusion for 1 day  EXAM: CHEST  2 VIEW  COMPARISON:  March 13, 2013  FINDINGS: There is slight scarring in the left base. The lungs are otherwise clear. Heart is enlarged with pulmonary vascularity within normal limits. No adenopathy. There is postoperative change over the right breast.  IMPRESSION: Mild scarring left base. No edema or consolidation. Stable cardiac enlargement.   Electronically Signed   By: Lowella Grip III M.D.   On: 09/27/2014 11:32     EKG Interpretation   Date/Time:  Friday September 27 2014 11:00:05 EDT Ventricular Rate:  85 PR  Interval:    QRS Duration: 146 QT Interval:  426 QTC Calculation: 506 R Axis:   -82 Text Interpretation:  Atrial fibrillation Left axis deviation Non-specific  intra-ventricular conduction block Abnormal ECG No significant change  since last tracing Confirmed by HORTON  MD, COURTNEY ([id number]) on 09/27/2014  11:01:54 AM      MDM   Final diagnoses:  UTI (lower urinary tract infection)   Patient presents with sensations of shortness of breath and confusion. Daughter is at the bedside. Patient is tangential in speech but oriented 3. Afebrile. No acute respiratory distress. EKG shows atrial fibrillation which is rate controlled. Chest x-ray without consolidation or edema.  Troponin negative. Low suspicion for PE given the patient is on a request. Urinalysis is nitrite positive. Feel this may be the cause of patient's confusion and subjective symptoms of shortness of breath. Objectively, patient is satting well on room air. Patient was given IV Rocephin. Discussed with the patient and her family admission versus discharge. The patient would like to be discharged home. Given that she is afebrile and oriented, I feel this is reasonable especially if she has help with the next 24 hours. Daughter states that somebody will be at home with her. They were given  strict return precautions.  After history, exam, and medical workup I feel the patient has been appropriately medically screened and is safe for discharge home. Pertinent diagnoses were discussed with the patient. Patient was given return precautions.   Merryl Hacker, MD 09/27/14 1520

## 2014-09-27 NOTE — Discharge Instructions (Signed)

## 2014-09-27 NOTE — ED Notes (Signed)
She is having pain in her vaginal area but does not want pain medication at this time.

## 2014-09-27 NOTE — ED Notes (Signed)
SOB onset last night. Pt states had to use 3 pillows for sleeping last night and continued to be SOB.

## 2014-09-30 LAB — URINE CULTURE: Colony Count: >=100000

## 2014-10-01 ENCOUNTER — Telehealth (HOSPITAL_BASED_OUTPATIENT_CLINIC_OR_DEPARTMENT_OTHER): Payer: Self-pay | Admitting: Emergency Medicine

## 2014-10-01 NOTE — Telephone Encounter (Signed)
Post ED Visit - Positive Culture Follow-up  Culture report reviewed by antimicrobial stewardship pharmacist: []  Wes [place]ish, Pharm.D., BCPS [x]  Heide Guile, Pharm.D., BCPS []  Alycia Rossetti, Pharm.D., BCPS []  [place], Pharm.D., BCPS, AAHIVP []  Legrand [place]o, Pharm.D., BCPS, AAHIVP []  Isac Sarna, Pharm.D., BCPS  Positive urine culture E. Coli Treated with cephalexin, organism sensitive to the same and no further patient follow-up is required at this time.  Hazle Nordmann 10/01/2014, 10:11 AM

## 2014-12-11 DIAGNOSIS — I5032 Chronic diastolic (congestive) heart failure: Secondary | ICD-10-CM | POA: Insufficient documentation

## 2015-01-10 ENCOUNTER — Telehealth (HOSPITAL_COMMUNITY): Payer: Self-pay

## 2015-01-10 NOTE — Telephone Encounter (Signed)
I have called and left a message with Valita to inquire about participation in Pulmonary Rehab per Dr. Isabella Bowens referral. Will follow up at a later time if patient does not call back.

## 2015-01-23 ENCOUNTER — Telehealth (HOSPITAL_COMMUNITY): Payer: Self-pay | Admitting: *Deleted

## 2015-02-10 ENCOUNTER — Encounter (HOSPITAL_COMMUNITY)
Admission: RE | Admit: 2015-02-10 | Discharge: 2015-02-10 | Disposition: A | Discharge status: Home / Self Care | Payer: Medicare Other | Source: Ambulatory Visit | Attending: Internal Medicine | Admitting: Internal Medicine

## 2015-02-10 ENCOUNTER — Encounter (HOSPITAL_COMMUNITY): Payer: Self-pay

## 2015-02-10 VITALS — BP 94/58 | HR 66 | Resp 18 | Ht 64.5 in | Wt 159.6 lb

## 2015-02-10 DIAGNOSIS — I509 Heart failure, unspecified: Secondary | ICD-10-CM | POA: Insufficient documentation

## 2015-02-10 DIAGNOSIS — I5030 Unspecified diastolic (congestive) heart failure: Secondary | ICD-10-CM

## 2015-02-10 HISTORY — DX: Heart failure, unspecified: I50.9

## 2015-02-10 HISTORY — DX: Cardiac arrhythmia, unspecified: I49.9

## 2015-02-10 HISTORY — DX: Cerebral infarction, unspecified: I63.9

## 2015-02-11 NOTE — Progress Notes (Signed)
Natalie Mendoza 81 y.o. female Pulmonary Rehab Orientation Note Patient arrived today in Cardiac and Pulmonary Rehab for orientation to Pulmonary Rehab. She ambulated from [place] parking accompanied by her daughter and granddaughter. She does not carry portable oxygen and has not been prescribed oxygen for home use. Color good, skin warm and dry. Patient is oriented to time and place. Patient's medical history and medications reviewed. Heart rate is normal, breath sounds clear to auscultation, no wheezes, rales, or rhonchi. Grip strength equal, strong. Distal pulses palpable. Patient reports she does take medications as prescribed. Patient states she follows a Regular diet. The patient reports no specific efforts to gain or lose weight. She does report gaining 20 pounds since placed on the estrogen blocking medication for her breast cancer. Patient's weight will be monitored closely. Demonstration and practice of PLB using pulse oximeter. Patient able to return demonstration satisfactorily. Safety and hand hygiene in the exercise area reviewed with patient. Patient voices understanding of the information reviewed. Department expectations discussed with patient and achievable goals were set. The patient shows enthusiasm about attending the program and we look forward to working with this nice lady. The patient is scheduled for a 6 min walk test on Tuesday 8/30 at 4:00 and to begin exercise on Thursday 9/1 in the 10:30 class.   45 minutes was spent on a variety of activities such as assessment of the patient, obtaining baseline data including height, weight, BMI, and grip strength, verifying medical history, allergies, and current medications, and teaching patient strategies for performing tasks with less respiratory effort with emphasis on pursed lip breathing.

## 2015-03-04 ENCOUNTER — Encounter (HOSPITAL_COMMUNITY)
Admission: RE | Admit: 2015-03-04 | Discharge: 2015-03-04 | Disposition: A | Discharge status: Home / Self Care | Payer: Medicare Other | Source: Ambulatory Visit | Attending: Internal Medicine | Admitting: Internal Medicine

## 2015-03-04 DIAGNOSIS — I5030 Unspecified diastolic (congestive) heart failure: Secondary | ICD-10-CM | POA: Insufficient documentation

## 2015-03-04 NOTE — Progress Notes (Signed)
Natalie Mendoza completed a Six-Minute Walk Test on 03/04/15 . [place] walked 997 feet with 0 breaks.  The patient's lowest oxygen saturation was 92 %, highest heart rate was 137 bpm , and highest blood pressure was 110/82. The patient was on room air. Patient stated that nothing hindered their walk test.

## 2015-03-06 ENCOUNTER — Encounter (HOSPITAL_COMMUNITY)
Admission: RE | Admit: 2015-03-06 | Discharge: 2015-03-06 | Disposition: A | Discharge status: Home / Self Care | Payer: Medicare Other | Source: Ambulatory Visit | Attending: Internal Medicine | Admitting: Internal Medicine

## 2015-03-06 DIAGNOSIS — I5030 Unspecified diastolic (congestive) heart failure: Secondary | ICD-10-CM | POA: Diagnosis not present

## 2015-03-06 NOTE — Progress Notes (Signed)
Today, [name] exercised at [employer]. [facility]. Service time was from 1330 to 1540.  The patient exercised by performing aerobic, strengthening, and stretching exercises. Oxygen saturation, heart rate, blood pressure, rate of perceived exertion, and shortness of breath were all monitored before, during, and after exercise. Ciella presented with no problems at today's exercise session. Patient attended the Exercise for the Pulmonary Patient class today.   The patient did  have an increase in workload intensity during today's exercise session.  Pre-exercise vitals: . Weight kg: 73.7 . Liters of O2: ra . SpO2: 95 . HR: 73 . BP: 114/80 . CBG: na  Exercise vitals: . Highest heartrate:  81 . Lowest oxygen saturation: 92 . Highest blood pressure: 132/70 . Liters of 02: ra  Post-exercise vitals: . SpO2: 94 . HR: 69 . BP: 129/91 . Liters of O2: ra . CBG: na Dr. Brand Males, Medical Director Dr. Carles Collet is immediately available during today's Pulmonary Rehab session for OLINDA NOLA on 03/06/2015  at 1330 class time.  Marland Kitchen

## 2015-03-07 ENCOUNTER — Emergency Department (HOSPITAL_BASED_OUTPATIENT_CLINIC_OR_DEPARTMENT_OTHER): Payer: Medicare Other

## 2015-03-07 ENCOUNTER — Emergency Department (HOSPITAL_BASED_OUTPATIENT_CLINIC_OR_DEPARTMENT_OTHER)
Admission: EM | Admit: 2015-03-07 | Discharge: 2015-03-08 | Disposition: A | Discharge status: Home / Self Care | Payer: Medicare Other | Attending: Emergency Medicine | Admitting: Emergency Medicine

## 2015-03-07 ENCOUNTER — Encounter (HOSPITAL_BASED_OUTPATIENT_CLINIC_OR_DEPARTMENT_OTHER): Payer: Self-pay | Admitting: *Deleted

## 2015-03-07 DIAGNOSIS — W19XXXA Unspecified fall, initial encounter: Secondary | ICD-10-CM

## 2015-03-07 DIAGNOSIS — W01198A Fall on same level from slipping, tripping and stumbling with subsequent striking against other object, initial encounter: Secondary | ICD-10-CM | POA: Diagnosis not present

## 2015-03-07 DIAGNOSIS — Y92009 Unspecified place in unspecified non-institutional (private) residence as the place of occurrence of the external cause: Secondary | ICD-10-CM | POA: Insufficient documentation

## 2015-03-07 DIAGNOSIS — I10 Essential (primary) hypertension: Secondary | ICD-10-CM | POA: Diagnosis not present

## 2015-03-07 DIAGNOSIS — Z8739 Personal history of other diseases of the musculoskeletal system and connective tissue: Secondary | ICD-10-CM | POA: Insufficient documentation

## 2015-03-07 DIAGNOSIS — Z8673 Personal history of transient ischemic attack (TIA), and cerebral infarction without residual deficits: Secondary | ICD-10-CM | POA: Diagnosis not present

## 2015-03-07 DIAGNOSIS — I509 Heart failure, unspecified: Secondary | ICD-10-CM | POA: Insufficient documentation

## 2015-03-07 DIAGNOSIS — S0990XA Unspecified injury of head, initial encounter: Secondary | ICD-10-CM | POA: Diagnosis not present

## 2015-03-07 DIAGNOSIS — Y998 Other external cause status: Secondary | ICD-10-CM | POA: Insufficient documentation

## 2015-03-07 DIAGNOSIS — Z79899 Other long term (current) drug therapy: Secondary | ICD-10-CM | POA: Insufficient documentation

## 2015-03-07 DIAGNOSIS — S7002XA Contusion of left hip, initial encounter: Secondary | ICD-10-CM | POA: Insufficient documentation

## 2015-03-07 DIAGNOSIS — Y9389 Activity, other specified: Secondary | ICD-10-CM | POA: Diagnosis not present

## 2015-03-07 DIAGNOSIS — S301XXA Contusion of abdominal wall, initial encounter: Secondary | ICD-10-CM

## 2015-03-07 DIAGNOSIS — S79912A Unspecified injury of left hip, initial encounter: Secondary | ICD-10-CM | POA: Diagnosis present

## 2015-03-07 NOTE — ED Notes (Signed)
Nurse first-pt walked through doors then was assisted to w/c-NAD-alert-daughter with pt

## 2015-03-07 NOTE — ED Notes (Signed)
Pt fell at home tonight.  Denies LOC.  Reports left knee, arm and hip pain.  Pt did hit her head.  Reports a slight HA.

## 2015-03-08 NOTE — ED Provider Notes (Signed)
CSN: [id number]     Arrival date & time 03/07/15  2143 History   First MD Initiated Contact with Patient 03/08/15 0043     Chief Complaint  Patient presents with  . Fall     (Consider location/radiation/quality/duration/timing/severity/associated sxs/prior Treatment) HPI  this is a 81 year old female who tripped over her cat about 9 PM and fell. The left side of her head hit the refrigerator and her left hip hit the floor. She is having moderate pain in her left iliac crest. She is able to bear weight on her left hip. There was no loss of consciousness. She has not been vomiting. She denies pain on the left side of her head but states it "feels funny". Pain in the left iliac crest is worse with movement or palpation.  Past Medical History  Diagnosis Date  . Hypertension   . Atrial fibrillation   . Pelvic relaxation   . Vaginal pessary present   . Osteoporosis   . Hx of joint problems   . Stroke     fully resolved TIA  . Arrhythmia     Atrial Fib  . CHF (congestive heart failure)    Past Surgical History  Procedure Laterality Date  . Breast lumpectomy     History reviewed. No pertinent family history. Social History  Substance Use Topics  . Smoking status: Never Smoker   . Smokeless tobacco: Never Used  . Alcohol Use: No   OB History    Gravida Para Term Preterm AB TAB SAB Ectopic Multiple Living   2 2             Review of Systems  All other systems reviewed and are negative.   Allergies  Review of patient's allergies indicates no known allergies.  Home Medications   Prior to Admission medications   Medication Sig Start Date End Date Taking? Authorizing Provider  anastrozole (ARIMIDEX) 1 MG tablet Take 1 mg by mouth daily.    Historical Provider, MD  apixaban (ELIQUIS) 5 MG TABS tablet Take 1 tablet (5 mg total) by mouth 2 (two) times daily. 03/15/13   Monika Salk, MD  bisoprolol (ZEBETA) 10 MG tablet Take by mouth. 12/05/14 12/06/15  Historical Provider, MD   diltiazem (CARDIZEM SR) 60 MG 12 hr capsule Take 1 capsule (60 mg total) by mouth every 12 (twelve) hours. 03/15/13   Monika Salk, MD  DULoxetine (CYMBALTA) 30 MG capsule Take 30 mg by mouth daily.    Historical Provider, MD  furosemide (LASIX) 20 MG tablet Take by mouth. 12/11/14 12/11/15  Historical Provider, MD  OXYQUINOLONE SULFATE VAGINAL (TRIMO-SAN) 0.025 % GEL Place vaginally. 10/24/14 09/02/15  Historical Provider, MD  risedronate (ACTONEL) 35 MG tablet Take 1 tablet (35 mg total) by mouth every 7 (seven) days. with water on empty stomach, nothing by mouth or lie down for next 30 minutes. 09/01/12   Eldred Manges, MD  valsartan (DIOVAN) 320 MG tablet Take 320 mg by mouth daily.    Historical Provider, MD   BP 153/104 mmHg  Pulse 75  Temp(Src) 97.5 F (36.4 C) (Oral)  Resp 16  Ht 5\' 5"  (1.651 m)  Wt 160 lb (72.576 kg)  BMI 26.63 kg/m2  SpO2 95%   Physical Exam  General: Well-developed, well-nourished female in no acute distress; appearance consistent with age of record HENT: normocephalic; no hematomas seen or palpated; no hemotympanum Eyes: pupils equal, round and reactive to light; extraocular muscles intact; lens implants Neck: supple; nontender Heart:  regular rate and rhythm Lungs: clear to auscultation bilaterally Abdomen: soft; nondistended; nontender; bowel sounds present Back: No spinal tenderness Extremities: No deformity; full range of motion; pulses normal; no pain on passive range of motion of left hip; tenderness over left iliac crest Neurologic: Awake, alert and oriented; motor function intact in all extremities and symmetric; no facial droop Skin: Warm and dry Psychiatric: Normal mood and affect    ED Course  Procedures (including critical care time)   MDM  Nursing notes and vitals signs, including pulse oximetry, reviewed.  Summary of this visit's results, reviewed by myself:  Imaging Studies: Ct Head Wo Contrast  03/07/2015   CLINICAL DATA:  Fall,  left side pain  EXAM: CT HEAD WITHOUT CONTRAST  TECHNIQUE: Contiguous axial images were obtained from the base of the skull through the vertex without intravenous contrast.  COMPARISON:  08/08/2013  FINDINGS: No skull fracture is noted. Paranasal sinuses and mastoid air cells are unremarkable. No intracranial hemorrhage, mass effect or midline shift. No acute cortical infarction. Stable encephalomalacia in right frontal lobe. Stable encephalomalacia in parietal/ occipital right lobe. No acute cortical infarction. No mass lesion is noted on this unenhanced scan. Mild cerebral atrophy again noted.  IMPRESSION: No acute intracranial abnormality. Stable atrophy. Stable chronic findings as described above   Electronically Signed   By: Lahoma Crocker M.D.   On: 03/07/2015 22:50   Dg Hip Unilat With Pelvis 2-3 Views Left  03/07/2015   CLINICAL DATA:  Fall, left hip pain  EXAM: DG HIP (WITH OR WITHOUT PELVIS) 2-3V LEFT  COMPARISON:  None.  FINDINGS: Three views of the left hip submitted. No acute fracture or subluxation. No radiopaque foreign body. Degenerative changes pubic symphysis.  IMPRESSION: Negative.   Electronically Signed   By: Lahoma Crocker M.D.   On: 03/07/2015 22:50       Shanon Rosser, MD 03/08/15 [phone]

## 2015-03-11 ENCOUNTER — Encounter (HOSPITAL_COMMUNITY)
Admission: RE | Admit: 2015-03-11 | Discharge: 2015-03-11 | Disposition: A | Discharge status: Home / Self Care | Payer: Medicare Other | Source: Ambulatory Visit | Attending: Internal Medicine | Admitting: Internal Medicine

## 2015-03-11 DIAGNOSIS — I5030 Unspecified diastolic (congestive) heart failure: Secondary | ICD-10-CM | POA: Diagnosis not present

## 2015-03-11 NOTE — Progress Notes (Signed)
Today, Natalie Mendoza exercised at [facility]. Service time was from 1330 to 1500.  The patient exercised by performing aerobic, strengthening, and stretching exercises. Oxygen saturation, heart rate, blood pressure, rate of perceived exertion, and shortness of breath were all monitored before, during, and after exercise. Analeia presented with no problems at today's exercise session.  The patient did not have an increase in workload intensity during today's exercise session.  Pre-exercise vitals: . Weight kg: 73.4 . Liters of O2: RA . SpO2: 94 . HR: 61 . BP: 124/60 . CBG: NA  Exercise vitals: . Highest heartrate:  104 . Lowest oxygen saturation: 90 . Highest blood pressure: 112/88 . Liters of 02: RA  Post-exercise vitals: . SpO2: 93 . HR: 53 . BP: 104/64 . Liters of O2: RA . CBG: NA Dr. Brand Males, Medical Director Dr. Carles Collet is immediately available during today's Pulmonary Rehab session for Natalie Mendoza on 03/11/2015  at 1330 class time.  Marland Kitchen

## 2015-03-13 ENCOUNTER — Encounter (HOSPITAL_COMMUNITY)
Admission: RE | Admit: 2015-03-13 | Discharge: 2015-03-13 | Disposition: A | Discharge status: Home / Self Care | Payer: Medicare Other | Source: Ambulatory Visit | Attending: Internal Medicine | Admitting: Internal Medicine

## 2015-03-13 DIAGNOSIS — I5030 Unspecified diastolic (congestive) heart failure: Secondary | ICD-10-CM | POA: Diagnosis not present

## 2015-03-13 NOTE — Progress Notes (Signed)
Today, Natalie Mendoza exercised at [facility]. Service time was from 1330 to 1520.  The patient exercised by performing aerobic, strengthening, and stretching exercises. Oxygen saturation, heart rate, blood pressure, rate of perceived exertion, and shortness of breath were all monitored before, during, and after exercise. Natalie Mendoza presented with no problems at today's exercise session.   Patient attended the Signs and Symptoms class today.   The patient did  have an increase in workload intensity during today's exercise session.  Pre-exercise vitals: . Weight kg: 73.3 . Liters of O2: ra . SpO2: 95 . HR: 82 . BP: 98/80 . CBG: na  Exercise vitals: . Highest heartrate:  108 . Lowest oxygen saturation: 92 . Highest blood pressure: 112/76 . Liters of 02: ra  Post-exercise vitals: . SpO2: 93 . HR: 84 . BP: 119/84 . Liters of O2: ra . CBG: na Dr. Brand Males, Medical Director [name] is immediately available during today's Pulmonary Rehab session for Natalie Mendoza on 03/13/2015  at 1330 class time.  Marland Kitchen

## 2015-03-18 ENCOUNTER — Encounter (HOSPITAL_COMMUNITY)
Admission: RE | Admit: 2015-03-18 | Discharge: 2015-03-18 | Disposition: A | Discharge status: Home / Self Care | Payer: Medicare Other | Source: Ambulatory Visit | Attending: Internal Medicine | Admitting: Internal Medicine

## 2015-03-18 DIAGNOSIS — I5030 Unspecified diastolic (congestive) heart failure: Secondary | ICD-10-CM | POA: Diagnosis not present

## 2015-03-18 NOTE — Progress Notes (Signed)
Today, Natalie Mendoza exercised at [facility]. Service time was from 1330 to 1500.  The patient exercised by performing aerobic, strengthening, and stretching exercises. Oxygen saturation, heart rate, blood pressure, rate of perceived exertion, and shortness of breath were all monitored before, during, and after exercise. Natalie Mendoza presented with no problems at today's exercise session.  The patient did not have an increase in workload intensity during today's exercise session.  Pre-exercise vitals: . Weight kg: 74.3 . Liters of O2: ra . SpO2: 95 . HR: 86 . BP: 96/70 . CBG: na  Exercise vitals: . Highest heartrate:  98 . Lowest oxygen saturation: 91 . Highest blood pressure: 110/64 . Liters of 02: ra  Post-exercise vitals: . SpO2: 93 . HR: 81 . BP: 99/71 . Liters of O2: ra . CBG: na  Dr. Brand Males, Medical Director [name] is immediately available during today's Pulmonary Rehab session for Natalie Mendoza on 03/18/2015 at 1330 class time.

## 2015-03-20 ENCOUNTER — Encounter (HOSPITAL_COMMUNITY)
Admission: RE | Admit: 2015-03-20 | Discharge: 2015-03-20 | Disposition: A | Discharge status: Home / Self Care | Payer: Medicare Other | Source: Ambulatory Visit | Attending: Internal Medicine | Admitting: Internal Medicine

## 2015-03-25 ENCOUNTER — Encounter (HOSPITAL_COMMUNITY): Payer: Medicare Other

## 2015-03-27 ENCOUNTER — Encounter (HOSPITAL_COMMUNITY)
Admission: RE | Admit: 2015-03-27 | Discharge: 2015-03-27 | Disposition: A | Discharge status: Home / Self Care | Payer: Medicare Other | Source: Ambulatory Visit | Attending: Internal Medicine | Admitting: Internal Medicine

## 2015-03-27 DIAGNOSIS — I5030 Unspecified diastolic (congestive) heart failure: Secondary | ICD-10-CM | POA: Diagnosis present

## 2015-03-27 NOTE — Progress Notes (Signed)
Today, Natalie Mendoza exercised at [facility]. Service time was from 1:30pm to 3:30pm.  The patient exercised by performing aerobic, strengthening, and stretching exercises. Oxygen saturation, heart rate, blood pressure, rate of perceived exertion, and shortness of breath were all monitored before, during, and after exercise. Natalie Mendoza presented with no problems at today's exercise session. The patient attended education today with Derek Mound on Nutrition.  The patient did not have an increase in workload intensity during today's exercise session.  Pre-exercise vitals: . Weight kg: 72.6 . Liters of O2: ra . SpO2: 95 . HR: 71 . BP: 98/72 . CBG: na  Exercise vitals: . Highest heartrate:  87 . Lowest oxygen saturation: 93 . Highest blood pressure: 111/71 . Liters of 02: ra  Post-exercise vitals: . SpO2: 93 . HR: 65 . BP: 102/56 . Liters of O2: ra . CBG: na  Dr. Brand Males, Medical Director Dr. Allyson Sabal is immediately available during today's Pulmonary Rehab session for Natalie Mendoza on 03/27/15 at 1:30pm class time

## 2015-04-01 ENCOUNTER — Encounter (HOSPITAL_COMMUNITY): Payer: Medicare Other

## 2015-04-03 ENCOUNTER — Encounter (HOSPITAL_COMMUNITY)
Admission: RE | Admit: 2015-04-03 | Discharge: 2015-04-03 | Disposition: A | Discharge status: Home / Self Care | Payer: Medicare Other | Source: Ambulatory Visit | Attending: Internal Medicine | Admitting: Internal Medicine

## 2015-04-03 DIAGNOSIS — I5030 Unspecified diastolic (congestive) heart failure: Secondary | ICD-10-CM | POA: Diagnosis not present

## 2015-04-03 NOTE — Progress Notes (Signed)
Today, Natalie Mendoza exercised at [facility]. Service time was from 1330 to 1530.  The patient exercised by performing aerobic, strengthening, and stretching exercises. Oxygen saturation, heart rate, blood pressure, rate of perceived exertion, and shortness of breath were all monitored before, during, and after exercise. Anora presented with no problems at today's exercise session.She attended risk factor reduction class today.  The patient did not have an increase in workload intensity during today's exercise session.  Pre-exercise vitals: . Weight kg: 73.9 . Liters of O2: RA . SpO2: 95 . HR: 61 . BP: 96/60 . CBG: NA  Exercise vitals: . Highest heartrate: 78  . Lowest oxygen saturation: 96 . Highest blood pressure: 94/54 . Liters of 02: RA  Post-exercise vitals: . SpO2: 92 . HR: 76 . BP: 100/54 . Liters of O2: RA . CBG: NA Dr. Brand Males, Medical Director Dr. Tamala Julian is immediately available during today's Pulmonary Rehab session for Natalie Mendoza on 04/03/2015  at 1330 class time.  Marland Kitchen

## 2015-04-08 ENCOUNTER — Encounter (HOSPITAL_COMMUNITY)
Admission: RE | Admit: 2015-04-08 | Discharge: 2015-04-08 | Disposition: A | Discharge status: Home / Self Care | Payer: Medicare Other | Source: Ambulatory Visit | Attending: Internal Medicine | Admitting: Internal Medicine

## 2015-04-08 DIAGNOSIS — I5030 Unspecified diastolic (congestive) heart failure: Secondary | ICD-10-CM | POA: Diagnosis not present

## 2015-04-08 NOTE — Progress Notes (Signed)
Today, [name] exercised at [facility]. Service time was from 1330 to 1500.  The patient exercised by performing aerobic, strengthening, and stretching exercises. Oxygen saturation, heart rate, blood pressure, rate of perceived exertion, and shortness of breath were all monitored before, during, and after exercise. Malan presented with no problems at today's exercise session.  The patient did not have an increase in workload intensity during today's exercise session.  Pre-exercise vitals: . Weight kg: 93.3 . Liters of O2: RA . SpO2: 96 . HR: 56 . BP: 98/70 . CBG: NA  Exercise vitals: . Highest heartrate:  82 . Lowest oxygen saturation: 94 . Highest blood pressure: 100/60 . Liters of 02: RA  Post-exercise vitals: . SpO2: 95 . HR: 75 . BP: 100/60 . Liters of O2: RA . CBG: NA Dr. Brand Males, Medical Director Dr. Tamala Julian is immediately available during today's Pulmonary Rehab session for NARELY NOBLES on 04/08/2015  at 1330 class time  .

## 2015-04-10 ENCOUNTER — Encounter (HOSPITAL_COMMUNITY)
Admission: RE | Admit: 2015-04-10 | Discharge: 2015-04-10 | Disposition: A | Discharge status: Home / Self Care | Payer: Medicare Other | Source: Ambulatory Visit | Attending: Internal Medicine | Admitting: Internal Medicine

## 2015-04-10 DIAGNOSIS — I5030 Unspecified diastolic (congestive) heart failure: Secondary | ICD-10-CM | POA: Diagnosis not present

## 2015-04-10 NOTE — Progress Notes (Signed)
Today, [name] exercised at [facility]. Service time was from 1330 to 1530.  The patient exercised by performing aerobic, strengthening, and stretching exercises. Oxygen saturation, heart rate, blood pressure, rate of perceived exertion, and shortness of breath were all monitored before, during, and after exercise. Sible presented with no problems at today's exercise session. Anshika also attended an education session on advanced directives.  The patient did not have an increase in workload intensity during today's exercise session.  Pre-exercise vitals: . Weight kg: 74.1 . Liters of O2: ra . SpO2: 94 . HR: 89 . BP: 104/72 . CBG: na  Exercise vitals: . Highest heartrate:  100 . Lowest oxygen saturation: ra . Highest blood pressure: 112/65 . Liters of 02: ra  Post-exercise vitals: . SpO2: 94 . HR: 78 . BP: 96/68 . Liters of O2: ra . CBG: na  Dr. Brand Males, Medical Director Dr. Wynelle Cleveland is immediately available during today's Pulmonary Rehab session for MARCINA KINNISON on 04/10/2015 at 1330 class time.

## 2015-04-15 ENCOUNTER — Encounter (HOSPITAL_COMMUNITY)
Admission: RE | Admit: 2015-04-15 | Discharge: 2015-04-15 | Disposition: A | Discharge status: Home / Self Care | Payer: Medicare Other | Source: Ambulatory Visit | Attending: Internal Medicine | Admitting: Internal Medicine

## 2015-04-15 ENCOUNTER — Telehealth (HOSPITAL_COMMUNITY): Payer: Self-pay | Admitting: *Deleted

## 2015-04-15 DIAGNOSIS — I5030 Unspecified diastolic (congestive) heart failure: Secondary | ICD-10-CM | POA: Diagnosis not present

## 2015-04-15 NOTE — Progress Notes (Signed)
Today, Natalie Mendoza exercised at [facility]. Service time was from 1330 to 1455.  The patient exercised by performing aerobic, strengthening, and stretching exercises. Oxygen saturation, heart rate, blood pressure, rate of perceived exertion, and shortness of breath were all monitored before, during, and after exercise. [name] presented with a low blood pressure at today's exercise session.  During exercise her blood pressure was 87/51, was given gatorade to drink, recheck 86/55, given water to drink, recheck 85/66.  Patient was asymptomatic.  We went over her medications and she was instructed to not take any other blood pressure medications today.  She was instructed to take her blood pressure 3 times per day and document.  Do not take her Lasix tomorrow.  I called and spoke to [name]'s daughter about her mother's blood pressure today.  She will make sure the patient is taking her medications as prescribed.  Patient will bring all her medications in Thursday to reconcile medications.  She sees Dr. Charlane Ferretti @ [facility].    The patient did not have an increase in workload intensity during today's exercise session.  Pre-exercise vitals: . Weight kg: 73.8 . Liters of O2: ra . SpO2: 94 . HR: 74 . BP: 104/60 . CBG: na  Exercise vitals: . Highest heartrate:  89 . Lowest oxygen saturation: 92 . Highest blood pressure: 87/51 . Liters of 02: ra  Post-exercise vitals: . SpO2: 94 . HR: 69 . BP: 87/64 . Liters of O2: ra . CBG: na Dr. Brand Males, Medical Director Dr. Wynelle Cleveland is immediately available during today's Pulmonary Rehab session for [name] on 04/15/2015  at 1330 class time.  Marland Kitchen

## 2015-04-17 ENCOUNTER — Encounter (HOSPITAL_COMMUNITY)
Admission: RE | Admit: 2015-04-17 | Discharge: 2015-04-17 | Disposition: A | Discharge status: Home / Self Care | Payer: Medicare Other | Source: Ambulatory Visit | Attending: Internal Medicine | Admitting: Internal Medicine

## 2015-04-17 DIAGNOSIS — I5030 Unspecified diastolic (congestive) heart failure: Secondary | ICD-10-CM | POA: Diagnosis not present

## 2015-04-17 NOTE — Progress Notes (Signed)
Today, Natalie Mendoza exercised at [facility]. Service time was from 1330 to 1520.  The patient exercised by performing aerobic, strengthening, and stretching exercises. Oxygen saturation, heart rate, blood pressure, rate of perceived exertion, and shortness of breath were all monitored before, during, and after exercise. [name] presented with no problems at today's exercise session.She attended diaphragmatic and purse lip breathing class today.During the exercise session today pt had consult with nutritionist.  The patient did not have an increase in workload intensity during today's exercise session.  Pre-exercise vitals: . Weight kg: 73.4 . Liters of O2: RA . SpO2: 93 . HR: 89 . BP: 98/70 . CBG: NA  Exercise vitals: . Highest heartrate:   . Lowest oxygen saturation: . Highest blood pressure:  . Liters of 02:   Post-exercise vitals: . SpO2: 91 . HR: 60 . BP: 122/66 . Liters of O2: RA . CBG: NA Dr. Brand Males, Medical Director Dr. Tamala Julian is immediately available during today's Pulmonary Rehab session for Natalie Mendoza on 04/17/2015  at 1330 class time  .

## 2015-04-17 NOTE — Progress Notes (Signed)
Patient's blood pressure was much better today, 98/70 and 122/66. It was low on the previous exercise session.   She and her daughter figured out that she was not following her new medication regime.  Her Norvasc had been reduced from 10 to 5 mg daily and she had not remembered to cut that pill in half.  That has been cleared up, she is following the new medication schedule and dosage and she felt much better today.

## 2015-04-17 NOTE — Progress Notes (Signed)
Natalie Mendoza 81 y.o. female Nutrition Note Spoke with pt. Pt is overweight. Pt c/o10 lb wt gain over the past 12 months. Pt wt in EMR confirm pt's wt gain. Pt concerned because she doesn't know how much wt gain is due to fluid. Pt has an appointment next week with her MD and plans on discussing wt gain with him. Wt loss tips reviewed. Pt eats 2 meals a day plus a snack.  Making healthy food choices the majority of the time.  Pt's Rate Your Plate results reviewed with pt. Pt avoids most salty food and does not add salt to food.  The role of sodium in lung disease reviewed with pt. Pt reports trouble with constipation, which is "better now that I'm eating an apple a day." Nutrition therapy for constipation briefly reviewed. Pt A1c in 2014 noted. Pre-diabetes discussed with pt. Pt reports her mother had diabetes "when she was around 35."  Pt plans on discussing pre-diabetes with MD. Pt expressed understanding of the information reviewed.   Lab Results  Component Value Date   HGBA1C 6.1* 03/14/2013   Vitals - 1 value per visit 03/07/2015 02/10/2015 09/27/2014 08/08/2013  Weight (lb) 160 159.61 150 150   Nutrition Diagnosis ? Food-and nutrition-related knowledge deficit related to lack of exposure to information as related to diagnosis of pulmonary disease ? Overweight related to excessive energy intake as evidenced by a BMI of 27.0 ?  Nutrition Rx/Est. Daily Nutrition Needs for: ? wt loss 1200-1300 Kcal  90-105 gm protein   1500 mg or less sodium   Nutrition Intervention ? Pt's individual nutrition plan and goals reviewed with pt. ? Benefits of adopting healthy eating habits discussed when pt's Rate Your Plate reviewed. ? Handout given for 5-day, 1200 kcal menu ideas ? Pt to attend the Nutrition and Lung Disease class ? Continual client-centered nutrition education by RD, as part of interdisciplinary care. Goal(s) 1. Identify food quantities necessary to achieve wt loss of  -2# per week to a goal  wt of 61.5-69.7 kg (135-153 lb) at graduation from pulmonary rehab. Monitor and Evaluate progress toward nutrition goal with team.   Derek Mound, M.Ed, RD, LDN, CDE 04/17/2015 3:23 PM

## 2015-04-22 ENCOUNTER — Encounter (HOSPITAL_COMMUNITY)
Admission: RE | Admit: 2015-04-22 | Discharge: 2015-04-22 | Disposition: A | Discharge status: Home / Self Care | Payer: Medicare Other | Source: Ambulatory Visit | Attending: Internal Medicine | Admitting: Internal Medicine

## 2015-04-22 DIAGNOSIS — I5030 Unspecified diastolic (congestive) heart failure: Secondary | ICD-10-CM | POA: Insufficient documentation

## 2015-04-22 NOTE — Progress Notes (Signed)
Today, [name] exercised at [facility]. Service time was from 1:30pm to 2:50pm.  The patient exercised by performing aerobic, strengthening, and stretching exercises. Oxygen saturation, heart rate, blood pressure, rate of perceived exertion, and shortness of breath were all monitored before, during, and after exercise. [name] presented with no problems at today's exercise session.  The patient did not have an increase in workload intensity during today's exercise session.  Pre-exercise vitals: . Weight kg: 74.2 . Liters of O2: ra . SpO2: 96 . HR: 82 . BP: 112/70 . CBG: na  Exercise vitals: . Highest heartrate:  114 . Lowest oxygen saturation: 95 . Highest blood pressure: 106/60 . Liters of 02: ra  Post-exercise vitals: . SpO2: 94 . HR: 73 . BP: 102/60 . Liters of O2: ra . CBG: na  Dr. Brand Males, Medical Director Dr. Marily Memos is immediately available during today's Pulmonary Rehab session for Natalie Mendoza on 04/22/15 at 1:30pm class time

## 2015-04-24 ENCOUNTER — Encounter (HOSPITAL_COMMUNITY)
Admission: RE | Admit: 2015-04-24 | Discharge: 2015-04-24 | Disposition: A | Discharge status: Home / Self Care | Payer: Medicare Other | Source: Ambulatory Visit | Attending: Internal Medicine | Admitting: Internal Medicine

## 2015-04-24 DIAGNOSIS — I5030 Unspecified diastolic (congestive) heart failure: Secondary | ICD-10-CM | POA: Diagnosis not present

## 2015-04-24 NOTE — Progress Notes (Signed)
Today, [name] exercised at [facility]. Service time was from 1330 to 1530.  The patient exercised by performing aerobic, strengthening, and stretching exercises. Oxygen saturation, heart rate, blood pressure, rate of perceived exertion, and shortness of breath were all monitored before, during, and after exercise. Willena presented with no problems at today's exercise session. Yohanna also attended an education session with RD on Holiday Eating.  The patient did not have an increase in workload intensity during today's exercise session.  Pre-exercise vitals: . Weight kg: 74.0 . Liters of O2: ra . SpO2: 96 . HR: 79 . BP: 104/50 . CBG: na  Exercise vitals: . Highest heartrate:  87 . Lowest oxygen saturation: 93 . Highest blood pressure: 96/82 . Liters of 02: ra  Post-exercise vitals: . SpO2: 94 . HR: 73 . BP: 94/60 . Liters of O2: ra . CBG: na  Dr. Brand Males, Medical Director Dr. Marily Memos is immediately available during today's Pulmonary Rehab session for SURIYA KOVARIK on 04/24/2015 at 1330 class time

## 2015-04-29 ENCOUNTER — Encounter (HOSPITAL_COMMUNITY): Payer: Medicare Other

## 2015-04-29 ENCOUNTER — Encounter (HOSPITAL_COMMUNITY)
Admission: RE | Admit: 2015-04-29 | Discharge: 2015-04-29 | Disposition: A | Discharge status: Home / Self Care | Payer: Medicare Other | Source: Ambulatory Visit | Attending: Internal Medicine | Admitting: Internal Medicine

## 2015-04-29 NOTE — Progress Notes (Signed)
Today, [name] exercised at [facility]. Service time was from 1:30pm to 2:55pm.  The patient exercised by performing aerobic, strengthening, and stretching exercises. Oxygen saturation, heart rate, blood pressure, rate of perceived exertion, and shortness of breath were all monitored before, during, and after exercise. Shaiann presented with no problems at today's exercise session.  The patient did not have an increase in workload intensity during today's exercise session.  Pre-exercise vitals: . Weight kg: 73.9 . Liters of O2: ra . SpO2: 96 . HR: 72 . BP: 100/74 . CBG: na  Exercise vitals: . Highest heartrate:  93 . Lowest oxygen saturation: 94 . Highest blood pressure: 100/54 . Liters of 02: ra  Post-exercise vitals: . SpO2: 92 . HR: 69 . BP: 104/64 . Liters of O2: ra . CBG: na  Dr. Brand Males, Medical Director [name] is immediately available during today's Pulmonary Rehab session for AYEISHA LINDENBERGER on 04/29/15 at 1:30pm class time.

## 2015-05-01 ENCOUNTER — Encounter (HOSPITAL_COMMUNITY)
Admission: RE | Admit: 2015-05-01 | Discharge: 2015-05-01 | Disposition: A | Discharge status: Home / Self Care | Payer: Medicare Other | Source: Ambulatory Visit | Attending: Internal Medicine | Admitting: Internal Medicine

## 2015-05-01 DIAGNOSIS — I5030 Unspecified diastolic (congestive) heart failure: Secondary | ICD-10-CM | POA: Diagnosis not present

## 2015-05-01 NOTE — Progress Notes (Signed)
Today, Natalie Mendoza exercised at [facility]. Service time was from 1330 to 1455.  The patient exercised by performing aerobic, strengthening, and stretching exercises. Oxygen saturation, heart rate, blood pressure, rate of perceived exertion, and shortness of breath were all monitored before, during, and after exercise. [name] presented with no problems at today's exercise session.She watched COPD video today in class.  The patient did  have an increase in workload intensity during today's exercise session.  Pre-exercise vitals: . Weight kg: 73.8 . Liters of O2: RA . SpO2: 92 . HR: 75 . BP: 100/70 . CBG: NA  Exercise vitals: . Highest heartrate:  88 . Lowest oxygen saturation: 92 . Highest blood pressure: 100/60 . Liters of 02: RA  Post-exercise vitals: . SpO2: 91 . HR: 73 . BP: 124/70 . Liters of O2: RA . CBG: NA Dr. Brand Males, Medical Director Dr. Marily Memos is immediately available during today's Pulmonary Rehab session for Natalie Mendoza on 05/01/2015  at 1330 class time  .

## 2015-05-06 ENCOUNTER — Encounter (HOSPITAL_COMMUNITY)
Admission: RE | Admit: 2015-05-06 | Discharge: 2015-05-06 | Disposition: A | Discharge status: Home / Self Care | Payer: Medicare Other | Source: Ambulatory Visit | Attending: Internal Medicine | Admitting: Internal Medicine

## 2015-05-06 DIAGNOSIS — I5030 Unspecified diastolic (congestive) heart failure: Secondary | ICD-10-CM | POA: Diagnosis not present

## 2015-05-06 NOTE — Progress Notes (Signed)
Today, Natalie Mendoza exercised at [facility]. Service time was from 1330 to 1500.  The patient exercised by performing aerobic, strengthening, and stretching exercises. Oxygen saturation, heart rate, blood pressure, rate of perceived exertion, and shortness of breath were all monitored before, during, and after exercise. Natalie Mendoza presented with no problems at today's exercise session.  The patient did not have an increase in workload intensity during today's exercise session.  Pre-exercise vitals: . Weight kg: 73.6 . Liters of O2: ra . SpO2: 95 . HR: 104 . BP: 100/72 . CBG: na  Exercise vitals: . Highest heartrate:  125 . Lowest oxygen saturation: 93 . Highest blood pressure: 124/76 . Liters of 02: ra  Post-exercise vitals: . SpO2: 94 . HR: 99 . BP: 100/72 . Liters of O2: ra . CBG: na Dr. Brand Males, Medical Director Dr. Marily Memos is immediately available during today's Pulmonary Rehab session for Natalie Mendoza on 05/06/2015  at 1330 class time  .

## 2015-05-08 ENCOUNTER — Encounter (HOSPITAL_COMMUNITY)
Admission: RE | Admit: 2015-05-08 | Discharge: 2015-05-08 | Disposition: A | Discharge status: Home / Self Care | Payer: Medicare Other | Source: Ambulatory Visit | Attending: Internal Medicine | Admitting: Internal Medicine

## 2015-05-08 DIAGNOSIS — I5030 Unspecified diastolic (congestive) heart failure: Secondary | ICD-10-CM | POA: Diagnosis not present

## 2015-05-08 NOTE — Progress Notes (Signed)
Today, Natalie Mendoza exercised at [employer]. Service time was from 1:30pm to 3:45pm.  The patient exercised by performing aerobic, strengthening, and stretching exercises. Oxygen saturation, heart rate, blood pressure, rate of perceived exertion, and shortness of breath were all monitored before, during, and after exercise. Natalie Mendoza presented with no problems at today's exercise session. The patient attended education today with [facility] Nicolena Schurman on Exercise for the Pulmonary Patient.  The patient did not have an increase in workload intensity during today's exercise session.  Pre-exercise vitals: . Weight kg: 73.8 . Liters of O2: ra . SpO2: 93 . HR: 72 . BP: 96/60 . CBG: na  Exercise vitals: . Highest heartrate:  72 . Lowest oxygen saturation: 97 . Highest blood pressure: 101/52 . Liters of 02: ra  Post-exercise vitals: . SpO2: 95 . HR: 69 . BP: 104/64 . Liters of O2: ra . CBG: na  Dr. Brand Males, Medical Director Dr. Cruzita Lederer is immediately available during today's Pulmonary Rehab session for Natalie Mendoza on 05/08/15 at 1:30pm class time.

## 2015-05-13 ENCOUNTER — Encounter (HOSPITAL_COMMUNITY): Payer: Medicare Other

## 2015-05-15 ENCOUNTER — Encounter (HOSPITAL_COMMUNITY): Payer: Medicare Other

## 2015-05-20 ENCOUNTER — Encounter (HOSPITAL_COMMUNITY)
Admission: RE | Admit: 2015-05-20 | Discharge: 2015-05-20 | Disposition: A | Discharge status: Home / Self Care | Payer: Medicare Other | Source: Ambulatory Visit | Attending: Internal Medicine | Admitting: Internal Medicine

## 2015-05-20 DIAGNOSIS — I5030 Unspecified diastolic (congestive) heart failure: Secondary | ICD-10-CM | POA: Diagnosis not present

## 2015-05-20 NOTE — Progress Notes (Signed)
Today, [name] exercised at [employer]. Service time was from 1330 to 1455.  The patient exercised by performing aerobic, strengthening, and stretching exercises. Oxygen saturation, heart rate, blood pressure, rate of perceived exertion, and shortness of breath were all monitored before, during, and after exercise. [name] presented with no problems at today's exercise session.  The patient did  have an increase in workload intensity during today's exercise session.  Pre-exercise vitals: . Weight kg: 74.2 . Liters of O2: RA . SpO2: 95 . HR: 72 . BP: 94/64 . CBG: NA  Exercise vitals: . Highest heartrate:  99 . Lowest oxygen saturation: 90 . Highest blood pressure: 90/60 . Liters of 02: RA  Post-exercise vitals: . SpO2: 96 . HR: 69 . BP: 92/60 . Liters of O2: RA . CBG: NA Dr. Brand Males, Medical Director Dr. Frederic Jericho is immediately available during today's Pulmonary Rehab session for Natalie Mendoza on 05/20/2015  at 1330 class time.  Marland Kitchen

## 2015-05-22 ENCOUNTER — Encounter (HOSPITAL_COMMUNITY)
Admission: RE | Admit: 2015-05-22 | Discharge: 2015-05-22 | Disposition: A | Discharge status: Home / Self Care | Payer: Medicare Other | Source: Ambulatory Visit | Attending: Internal Medicine | Admitting: Internal Medicine

## 2015-05-22 DIAGNOSIS — I5030 Unspecified diastolic (congestive) heart failure: Secondary | ICD-10-CM | POA: Diagnosis present

## 2015-05-22 NOTE — Progress Notes (Signed)
Today, [name] exercised at [employer]. Service time was from 1330 to 1530.  The patient exercised by performing aerobic, strengthening, and stretching exercises. Oxygen saturation, heart rate, blood pressure, rate of perceived exertion, and shortness of breath were all monitored before, during, and after exercise. [name] presented with no problems at today's exercise session. [name] also attended an education session on warning signs and symptoms.  The patient did not have an increase in workload intensity during today's exercise session.  Pre-exercise vitals: . Weight kg: 74.0 . Liters of O2: ra . SpO2: 92 . HR: 85 . BP: 80/70,92/76 . CBG: na  Exercise vitals: . Highest heartrate:  87 . Lowest oxygen saturation: 94 . Highest blood pressure: 94/60 . Liters of 02: ra  Post-exercise vitals: . SpO2: 92 . HR: 84 . BP: 94/60 . Liters of O2: ra . CBG: na  Dr. Rush Farmer, Medical Director Dr. [name] is immediately available during today's Pulmonary Rehab session for Natalie Mendoza on 05/22/2015 at 1330 class time

## 2015-05-27 ENCOUNTER — Encounter (HOSPITAL_COMMUNITY)
Admission: RE | Admit: 2015-05-27 | Discharge: 2015-05-27 | Disposition: A | Discharge status: Home / Self Care | Payer: Medicare Other | Source: Ambulatory Visit | Attending: Internal Medicine | Admitting: Internal Medicine

## 2015-05-27 DIAGNOSIS — I5030 Unspecified diastolic (congestive) heart failure: Secondary | ICD-10-CM | POA: Diagnosis not present

## 2015-05-27 NOTE — Progress Notes (Signed)
Today, Natalie Mendoza exercised at [employer]. Service time was from 1330 to 1500.  The patient exercised by performing aerobic, strengthening, and stretching exercises. Oxygen saturation, heart rate, blood pressure, rate of perceived exertion, and shortness of breath were all monitored before, during, and after exercise. Natalie Mendoza presented with no problems at today's exercise session.  The patient did not have an increase in workload intensity during today's exercise session.  Pre-exercise vitals: . Weight kg: 75.0 . Liters of O2: RA . SpO2: 95 . HR: 88 . BP: 102/80 . CBG: NA  Exercise vitals: . Highest heartrate:  87 . Lowest oxygen saturation: 92 . Highest blood pressure: 92/62 . Liters of 02: RA  Post-exercise vitals: . SpO2: 95 . HR: 80 . BP: 102/70 . Liters of O2: RA . CBG: NA Dr. Rush Farmer, Medical Director Dr. Marily Memos is immediately available during today's Pulmonary Rehab session for Natalie Mendoza on 05/27/2015  at 1330 class time  .

## 2015-05-29 ENCOUNTER — Encounter (HOSPITAL_COMMUNITY)
Admission: RE | Admit: 2015-05-29 | Discharge: 2015-05-29 | Disposition: A | Discharge status: Home / Self Care | Payer: Medicare Other | Source: Ambulatory Visit | Attending: Internal Medicine | Admitting: Internal Medicine

## 2015-05-29 DIAGNOSIS — I5030 Unspecified diastolic (congestive) heart failure: Secondary | ICD-10-CM | POA: Diagnosis not present

## 2015-05-29 NOTE — Progress Notes (Signed)
Today, [name] exercised at [employer]. Service time was from 1:30pm to 3:50pm.  The patient exercised by performing aerobic, strengthening, and stretching exercises. Oxygen saturation, heart rate, blood pressure, rate of perceived exertion, and shortness of breath were all monitored before, during, and after exercise. Sundy presented with no problems at today's exercise session.The patient attended education today with Trish Fountain on Anatomy and Physiology.    The patient did not have an increase in workload intensity during today's exercise session.  Pre-exercise vitals: . Weight kg: 75.0 . Liters of O2: ra . SpO2: 94 . HR: 86 . BP: 100/68 . CBG: na  Exercise vitals: . Highest heartrate:  80 . Lowest oxygen saturation: 88 . Highest blood pressure: 108/82 . Liters of 02: ra  Post-exercise vitals: . SpO2: 91 . HR: 96 . BP: 98/64 . Liters of O2: ra . CBG: na  Dr. Rush Farmer, Medical Director Dr. Frederic Jericho is immediately available during today's Pulmonary Rehab session for LAFAYETTE DOLLARD on 05/29/15 at 1:30pm class time.

## 2015-06-03 ENCOUNTER — Encounter (HOSPITAL_COMMUNITY)
Admission: RE | Admit: 2015-06-03 | Discharge: 2015-06-03 | Disposition: A | Discharge status: Home / Self Care | Payer: Medicare Other | Source: Ambulatory Visit | Attending: Internal Medicine | Admitting: Internal Medicine

## 2015-06-03 DIAGNOSIS — I5030 Unspecified diastolic (congestive) heart failure: Secondary | ICD-10-CM | POA: Diagnosis not present

## 2015-06-03 NOTE — Progress Notes (Signed)
Today, Natalie Mendoza exercised at [employer]. Service time was from 1330 to 1500.  The patient exercised by performing aerobic, strengthening, and stretching exercises. Oxygen saturation, heart rate, blood pressure, rate of perceived exertion, and shortness of breath were all monitored before, during, and after exercise. [name] presented with no problems at today's exercise session.  The patient did not have an increase in workload intensity during today's exercise session.  Pre-exercise vitals: . Weight kg: 74.1 . Liters of O2: ra . SpO2: 95 . HR: 64 . BP: 96/78 . CBG: na  Exercise vitals: . Highest heartrate:  89 . Lowest oxygen saturation: 93 . Highest blood pressure: 94/70 . Liters of 02: ra  Post-exercise vitals: . SpO2: 93 . HR: 71 . BP: 120/80 . Liters of O2: ra . CBG: na Dr. Rush Farmer, Medical Director Dr. Frederic Jericho is immediately available during today's Pulmonary Rehab session for Natalie Mendoza on 06/03/2015  at 1330 class time.  Marland Kitchen

## 2015-06-05 ENCOUNTER — Encounter (HOSPITAL_COMMUNITY)
Admission: RE | Admit: 2015-06-05 | Discharge: 2015-06-05 | Disposition: A | Discharge status: Home / Self Care | Payer: Medicare Other | Source: Ambulatory Visit | Attending: Internal Medicine | Admitting: Internal Medicine

## 2015-06-05 DIAGNOSIS — I5030 Unspecified diastolic (congestive) heart failure: Secondary | ICD-10-CM | POA: Diagnosis not present

## 2015-06-05 NOTE — Progress Notes (Addendum)
Today, [name] exercised at [employer]. Service time was from 1330 to 1530.  The patient exercised by performing aerobic, strengthening, and stretching exercises. Oxygen saturation, heart rate, blood pressure, rate of perceived exertion, and shortness of breath were all monitored before, during, and after exercise. Kahmora presented with no problems at today's exercise session.She attended the Advanced Directive class today.  The patient did not have an increase in workload intensity during today's exercise session.  Pre-exercise vitals: . Weight kg: 74.4 . Liters of O2: RA . SpO2: 94 . HR: 79 . BP: 110/70 . CBG: NA  Exercise vitals: . Highest heartrate:  88 . Lowest oxygen saturation: 93 . Highest blood pressure: 90/66 . Liters of 02: RA  Post-exercise vitals: . SpO2: 96 . HR: 69 . BP: 94/64 . Liters of O2: RA . CBG: NA Dr. Rush Farmer, Medical Director Dr. Frederic Jericho is immediately available during today's Pulmonary Rehab session for ALAN SCHWIEGER on 06/05/2015  at 1330 class time.  Marland Kitchen

## 2015-06-10 ENCOUNTER — Encounter (HOSPITAL_COMMUNITY)
Admission: RE | Admit: 2015-06-10 | Discharge: 2015-06-10 | Disposition: A | Discharge status: Home / Self Care | Payer: Medicare Other | Source: Ambulatory Visit | Attending: Internal Medicine | Admitting: Internal Medicine

## 2015-06-10 DIAGNOSIS — I5030 Unspecified diastolic (congestive) heart failure: Secondary | ICD-10-CM | POA: Diagnosis not present

## 2015-06-10 NOTE — Progress Notes (Signed)
Lethia completed a Six-Minute Walk Test on 06/10/15 . Jerlean walked 1376 feet with 0 breaks.  The patient's lowest oxygen saturation was 93 %, highest heart rate was 112 bpm , and highest blood pressure was 112/60. The patient was on room air. Patient stated that nothing hindered their walk test.

## 2015-06-12 ENCOUNTER — Encounter (HOSPITAL_COMMUNITY): Payer: Medicare Other

## 2015-06-17 ENCOUNTER — Encounter (HOSPITAL_COMMUNITY): Payer: Medicare Other

## 2015-06-17 NOTE — Progress Notes (Signed)
Discharge Note from Pulmonary Rehab  Natalie Mendoza has graduated from pulmonary rehab.  She improved her quality of life by 18%, increased her pulmonary education by 44%, and increased her 6 minute walk test distance by 27.5%.  She did complete one of her program goals which was to increase her strength and stamina.  She also wanted to lose weight, but this did not occur she actually gained 2 kg while in the program.  She will continue exercising after graduation at [facility].

## 2015-06-19 ENCOUNTER — Encounter (HOSPITAL_COMMUNITY): Payer: Medicare Other

## 2015-11-23 DIAGNOSIS — R140 Abdominal distension (gaseous): Secondary | ICD-10-CM | POA: Insufficient documentation

## 2016-01-06 ENCOUNTER — Emergency Department (HOSPITAL_BASED_OUTPATIENT_CLINIC_OR_DEPARTMENT_OTHER)
Admission: EM | Admit: 2016-01-06 | Discharge: 2016-01-06 | Disposition: A | Discharge status: Home / Self Care | Payer: Medicare Other | Attending: Emergency Medicine | Admitting: Emergency Medicine

## 2016-01-06 ENCOUNTER — Emergency Department (HOSPITAL_BASED_OUTPATIENT_CLINIC_OR_DEPARTMENT_OTHER): Payer: Medicare Other

## 2016-01-06 ENCOUNTER — Encounter (HOSPITAL_BASED_OUTPATIENT_CLINIC_OR_DEPARTMENT_OTHER): Payer: Self-pay | Admitting: *Deleted

## 2016-01-06 DIAGNOSIS — I4891 Unspecified atrial fibrillation: Secondary | ICD-10-CM | POA: Insufficient documentation

## 2016-01-06 DIAGNOSIS — Y92000 Kitchen of unspecified non-institutional (private) residence as  the place of occurrence of the external cause: Secondary | ICD-10-CM | POA: Insufficient documentation

## 2016-01-06 DIAGNOSIS — S0093XA Contusion of unspecified part of head, initial encounter: Secondary | ICD-10-CM | POA: Diagnosis not present

## 2016-01-06 DIAGNOSIS — Y939 Activity, unspecified: Secondary | ICD-10-CM | POA: Insufficient documentation

## 2016-01-06 DIAGNOSIS — N390 Urinary tract infection, site not specified: Secondary | ICD-10-CM | POA: Diagnosis not present

## 2016-01-06 DIAGNOSIS — I509 Heart failure, unspecified: Secondary | ICD-10-CM | POA: Insufficient documentation

## 2016-01-06 DIAGNOSIS — M810 Age-related osteoporosis without current pathological fracture: Secondary | ICD-10-CM | POA: Diagnosis not present

## 2016-01-06 DIAGNOSIS — Y999 Unspecified external cause status: Secondary | ICD-10-CM | POA: Diagnosis not present

## 2016-01-06 DIAGNOSIS — R55 Syncope and collapse: Secondary | ICD-10-CM | POA: Diagnosis present

## 2016-01-06 DIAGNOSIS — X58XXXA Exposure to other specified factors, initial encounter: Secondary | ICD-10-CM | POA: Diagnosis not present

## 2016-01-06 DIAGNOSIS — I110 Hypertensive heart disease with heart failure: Secondary | ICD-10-CM | POA: Diagnosis not present

## 2016-01-06 DIAGNOSIS — Z8673 Personal history of transient ischemic attack (TIA), and cerebral infarction without residual deficits: Secondary | ICD-10-CM | POA: Diagnosis not present

## 2016-01-06 LAB — CBC WITH DIFFERENTIAL/PLATELET
BASOS ABS: 0 10*3/uL (ref 0.0–0.1)
Basophils Relative: 0 %
EOS ABS: 0.1 10*3/uL (ref 0.0–0.7)
EOS PCT: 1 %
HCT: 45.8 % (ref 36.0–46.0)
Hemoglobin: 15.9 g/dL — ABNORMAL HIGH (ref 12.0–15.0)
LYMPHS PCT: 19 %
Lymphs Abs: 1.5 10*3/uL (ref 0.7–4.0)
MCH: 32.6 pg (ref 26.0–34.0)
MCHC: 34.7 g/dL (ref 30.0–36.0)
MCV: 94 fL (ref 78.0–100.0)
MONO ABS: 0.7 10*3/uL (ref 0.1–1.0)
Monocytes Relative: 9 %
Neutro Abs: 5.4 10*3/uL (ref 1.7–7.7)
Neutrophils Relative %: 71 %
PLATELETS: 174 10*3/uL (ref 150–400)
RBC: 4.87 MIL/uL (ref 3.87–5.11)
RDW: 12.7 % (ref 11.5–15.5)
WBC: 7.6 10*3/uL (ref 4.0–10.5)

## 2016-01-06 LAB — COMPREHENSIVE METABOLIC PANEL
ALT: 29 U/L (ref 14–54)
AST: 31 U/L (ref 15–41)
Albumin: 4.3 g/dL (ref 3.5–5.0)
Alkaline Phosphatase: 47 U/L (ref 38–126)
Anion gap: 10 (ref 5–15)
BUN: 14 mg/dL (ref 6–20)
CHLORIDE: 99 mmol/L — AB (ref 101–111)
CO2: 28 mmol/L (ref 22–32)
Calcium: 9.3 mg/dL (ref 8.9–10.3)
Creatinine, Ser: 0.71 mg/dL (ref 0.44–1.00)
Glucose, Bld: 110 mg/dL — ABNORMAL HIGH (ref 65–99)
POTASSIUM: 3.5 mmol/L (ref 3.5–5.1)
Sodium: 137 mmol/L (ref 135–145)
Total Bilirubin: 1.4 mg/dL — ABNORMAL HIGH (ref 0.3–1.2)
Total Protein: 7.2 g/dL (ref 6.5–8.1)

## 2016-01-06 LAB — URINALYSIS, ROUTINE W REFLEX MICROSCOPIC
BILIRUBIN URINE: NEGATIVE
GLUCOSE, UA: NEGATIVE mg/dL
Hgb urine dipstick: NEGATIVE
KETONES UR: NEGATIVE mg/dL
Nitrite: NEGATIVE
PROTEIN: NEGATIVE mg/dL
Specific Gravity, Urine: 1.014 (ref 1.005–1.030)
pH: 6 (ref 5.0–8.0)

## 2016-01-06 LAB — TROPONIN I

## 2016-01-06 LAB — PROTIME-INR
INR: 1.2 (ref 0.00–1.49)
Prothrombin Time: 15.4 seconds — ABNORMAL HIGH (ref 11.6–15.2)

## 2016-01-06 LAB — URINE MICROSCOPIC-ADD ON

## 2016-01-06 LAB — CBG MONITORING, ED: GLUCOSE-CAPILLARY: 114 mg/dL — AB (ref 65–99)

## 2016-01-06 MED ORDER — CEPHALEXIN 250 MG PO CAPS
500.0000 mg | ORAL_CAPSULE | Freq: Once | ORAL | Status: AC
  Administered 2016-01-06: 500 mg via ORAL
  Filled 2016-01-06: qty 2

## 2016-01-06 MED ORDER — TRAMADOL HCL 50 MG PO TABS
50.0000 mg | ORAL_TABLET | Freq: Once | ORAL | Status: AC
  Administered 2016-01-06: 50 mg via ORAL
  Filled 2016-01-06: qty 1

## 2016-01-06 MED ORDER — CEPHALEXIN 500 MG PO CAPS: 500.0000 mg | ORAL_CAPSULE | Freq: Two times a day (BID) | ORAL | Status: DC

## 2016-01-06 MED ORDER — TRAMADOL HCL 50 MG PO TABS: 50.0000 mg | ORAL_TABLET | Freq: Four times a day (QID) | ORAL | Status: DC | PRN

## 2016-01-06 MED FILL — CEPHALEXIN 500 MG CAPSULE: 500 | 7 days supply | Qty: 14 | Fill #0

## 2016-01-06 MED FILL — traMADol HCL 50 MG TABS: 50 | 5 days supply | Qty: 20 | Fill #0

## 2016-01-06 NOTE — ED Notes (Signed)
She got up at 2am due to leg cramps. She passed out hitting her forehead. Large hematoma noted. Abrasion to her nose. Sharp pain in her left chest into her back with movement.

## 2016-01-06 NOTE — ED Notes (Signed)
Pt placed on cardiac monitor 

## 2016-01-06 NOTE — ED Provider Notes (Signed)
CSN: [id number]     Arrival date & time 01/06/16  1112 History   First MD Initiated Contact with Patient 01/06/16 1226     Chief Complaint  Patient presents with  . Loss of Consciousness     (Consider location/radiation/quality/duration/timing/severity/associated sxs/prior Treatment) HPI Comments: 82 year old female with a history of hypertension, atrial fibrillation on Eliquis, CVA, CHF presents following an episode of syncope last night. The patient states that she had gotten up at 2 AM due to leg cramps. She said this happens basically every night. She said it was spasming of her muscles in both legs. She has been trying to take mustard to help with the spasming. She went to the kitchen to get the mustard and then when she was walking back to her room she suddenly started to feel lightheaded. She said the next thing she knew she was on the floor and waking up. She was able to get herself up off the floor and then walked back to her bedroom and went to bed. She noted this morning that she had a hematoma on her forehead. She denies any shortness of breath or chest pain. She has not been ill. No fever, cough, diarrhea, vomiting. She currently feels fine. This happened at around 2 AM last night. She did not present until 11 AM this morning because she did not want to awaken her daughter. She said she has been able to do all of her activities of daily living since she woke up this morning. She and her daughter state that they came in for evaluation because she is on blood thinners and hit her head. She has a follow-up appointment with her cardiologist tomorrow.   Past Medical History  Diagnosis Date  . Hypertension   . Atrial fibrillation ([place])   . Pelvic relaxation   . Vaginal pessary present   . Osteoporosis   . Hx of joint problems   . Stroke [facility])     fully resolved TIA  . Arrhythmia     Atrial Fib  . CHF (congestive heart failure) [facility])    Past Surgical History  Procedure Laterality  Date  . Breast lumpectomy     No family history on file. Social History  Substance Use Topics  . Smoking status: Never Smoker   . Smokeless tobacco: Never Used  . Alcohol Use: No   OB History    Gravida Para Term Preterm AB TAB SAB Ectopic Multiple Living   2 2             Review of Systems  Constitutional: Negative for fever, chills and fatigue.  HENT: Negative for congestion, nosebleeds, postnasal drip and rhinorrhea.   Eyes: Negative for visual disturbance.  Respiratory: Negative for cough, chest tightness and shortness of breath.   Cardiovascular: Negative for chest pain, palpitations and leg swelling.  Gastrointestinal: Negative for nausea, vomiting, abdominal pain and diarrhea.  Genitourinary: Negative for dysuria, urgency and hematuria.  Musculoskeletal: Negative for myalgias and back pain.  Skin: Positive for wound (left forehead hematoma). Negative for rash.  Neurological: Positive for syncope. Negative for dizziness, seizures, weakness, numbness and headaches.  Hematological: Bruises/bleeds easily.      Allergies  Review of patient's allergies indicates no known allergies.  Home Medications   Prior to Admission medications   Medication Sig Start Date End Date Taking? Authorizing Provider  anastrozole (ARIMIDEX) 1 MG tablet Take 1 mg by mouth daily.    Historical Provider, MD  apixaban (ELIQUIS) 5 MG TABS tablet  Take 1 tablet (5 mg total) by mouth 2 (two) times daily. 03/15/13   Monika Salk, MD  bisoprolol (ZEBETA) 10 MG tablet Take by mouth. 12/05/14 12/06/15  Historical Provider, MD  cephALEXin (KEFLEX) 500 MG capsule Take 1 capsule (500 mg total) by mouth 2 (two) times daily. 01/06/16   Harvel Quale, MD  diltiazem (CARDIZEM SR) 60 MG 12 hr capsule Take 1 capsule (60 mg total) by mouth every 12 (twelve) hours. 03/15/13   Monika Salk, MD  DULoxetine (CYMBALTA) 30 MG capsule Take 30 mg by mouth daily.    Historical Provider, MD  furosemide (LASIX) 20 MG tablet Take  by mouth. 12/11/14 12/11/15  Historical Provider, MD  risedronate (ACTONEL) 35 MG tablet Take 1 tablet (35 mg total) by mouth every 7 (seven) days. with water on empty stomach, nothing by mouth or lie down for next 30 minutes. 09/01/12   Eldred Manges, MD  traMADol (ULTRAM) 50 MG tablet Take 1 tablet (50 mg total) by mouth every 6 (six) hours as needed for moderate pain or severe pain. 01/06/16   Harvel Quale, MD  valsartan (DIOVAN) 320 MG tablet Take 320 mg by mouth daily.    Historical Provider, MD   BP 121/86 mmHg  Pulse 70  Temp(Src) 97.8 F (36.6 C) (Oral)  Resp 18  Ht 5\' 6"  (1.676 m)  Wt 166 lb (75.297 kg)  BMI 26.81 kg/m2  SpO2 95% Physical Exam  Constitutional: She is oriented to person, place, and time. She appears well-developed and well-nourished. No distress.  HENT:  Head: Normocephalic. Head is with contusion. Head is without raccoon's eyes, without Battle's sign and without laceration. Hair is normal.    Right Ear: External ear normal. No hemotympanum.  Left Ear: External ear normal. No hemotympanum.  Nose: Nose normal.  Mouth/Throat: Oropharynx is clear and moist. No oropharyngeal exudate.  Eyes: EOM are normal. Pupils are equal, round, and reactive to light.  Neck: Normal range of motion. Neck supple.  Cardiovascular: Normal rate, normal heart sounds and intact distal pulses.  An irregularly irregular rhythm present.  No murmur heard. Pulmonary/Chest: Effort normal. No respiratory distress. She has no wheezes. She has no rales.  Abdominal: Soft. She exhibits no distension. There is no tenderness.  Musculoskeletal: Normal range of motion. She exhibits no edema or tenderness.  Neurological: She is alert and oriented to person, place, and time. She has normal strength. No cranial nerve deficit or sensory deficit. She exhibits normal muscle tone. Coordination and gait normal.  Skin: Skin is warm and dry. No rash noted. She is not diaphoretic.  Vitals reviewed.   ED  Course  Procedures (including critical care time) Labs Review Labs Reviewed  CBC WITH DIFFERENTIAL/PLATELET - Abnormal; Notable for the following:    Hemoglobin 15.9 (*)    All other components within normal limits  COMPREHENSIVE METABOLIC PANEL - Abnormal; Notable for the following:    Chloride 99 (*)    Glucose, Bld 110 (*)    Total Bilirubin 1.4 (*)    All other components within normal limits  URINALYSIS, ROUTINE W REFLEX MICROSCOPIC (NOT AT [facility]) - Abnormal; Notable for the following:    APPearance CLOUDY (*)    Leukocytes, UA LARGE (*)    All other components within normal limits  PROTIME-INR - Abnormal; Notable for the following:    Prothrombin Time 15.4 (*)    All other components within normal limits  URINE MICROSCOPIC-ADD ON - Abnormal; Notable for the  following:    Squamous Epithelial / LPF 0-5 (*)    Bacteria, UA FEW (*)    All other components within normal limits  CBG MONITORING, ED - Abnormal; Notable for the following:    Glucose-Capillary 114 (*)    All other components within normal limits  URINE CULTURE  TROPONIN I    Imaging Review Dg Ribs Unilateral W/chest Left  01/06/2016  CLINICAL DATA:  Trauma with loss of consciousness an injury to the head, face, and left upper ribs. Left upper chest pain. EXAM: LEFT RIBS AND CHEST - 3+ VIEW COMPARISON:  09/27/2014 FINDINGS: Stable mild enlargement of the cardiopericardial silhouette. Clips project over the right chest. Faint lingular scarring on the left. No pneumothorax or blunting of the costophrenic angles. No distinct rib fractures identified. Thoracic spondylosis. IMPRESSION: 1. No rib fracture identified. Please note that nondisplaced rib fractures can be occult on conventional radiography. 2. Mild lingular scarring. 3. Mild enlargement of the cardiopericardial silhouette. Electronically Signed   By: Van Clines M.D.   On: 01/06/2016 14:12   Ct Head Wo Contrast  01/06/2016  CLINICAL DATA:  Loss of  consciousness striking the head and face with left frontal and facial swelling and pain. EXAM: CT HEAD WITHOUT CONTRAST CT CERVICAL SPINE WITHOUT CONTRAST TECHNIQUE: Multidetector CT imaging of the head and cervical spine was performed following the standard protocol without intravenous contrast. Multiplanar CT image reconstructions of the cervical spine were also generated. COMPARISON:  03/07/2015 FINDINGS: CT HEAD FINDINGS Periventricular white matter and corona radiata hypodensities favor chronic ischemic microvascular white matter disease. Suspected remote right insular cortex infarct, not appreciably changed, image 12/2. Remote right parietal infarct, image 17/2, unchanged. No intracranial hemorrhage, mass lesion, or acute CVA. Left frontal forehead scalp hematoma. Old fracture of the frontal process of the right maxilla, no change from 11/26/2011. There is opacification of a single mastoid air cell. No calvarial fracture is identified. CT CERVICAL SPINE FINDINGS Loss of disc height at C5-6 and C6-7 with associated posterior osseous ridging. Uncinate spurring causes moderate right foraminal impingement at C5-6 and borderline right foraminal impingement at C6-7. No cervical spine fracture or malalignment identified. No prevertebral soft tissue swelling. IMPRESSION: 1. No acute intracranial findings or acute cervical spine findings. 2. Small remote infarcts involving the right insular cortex inferiorly, and in the right parietal lobe, no change from 03/07/2015. 3. Remote fracture the frontal process of the right maxilla, no change from 11/26/2011. 4. Moderate bony right foraminal impingement at C5-6 due to uncinate spurring. 5. Left frontal forehead scalp hematoma. Electronically Signed   By: Van Clines M.D.   On: 01/06/2016 14:20   Ct Cervical Spine Wo Contrast  01/06/2016  CLINICAL DATA:  Loss of consciousness striking the head and face with left frontal and facial swelling and pain. EXAM: CT HEAD  WITHOUT CONTRAST CT CERVICAL SPINE WITHOUT CONTRAST TECHNIQUE: Multidetector CT imaging of the head and cervical spine was performed following the standard protocol without intravenous contrast. Multiplanar CT image reconstructions of the cervical spine were also generated. COMPARISON:  03/07/2015 FINDINGS: CT HEAD FINDINGS Periventricular white matter and corona radiata hypodensities favor chronic ischemic microvascular white matter disease. Suspected remote right insular cortex infarct, not appreciably changed, image 12/2. Remote right parietal infarct, image 17/2, unchanged. No intracranial hemorrhage, mass lesion, or acute CVA. Left frontal forehead scalp hematoma. Old fracture of the frontal process of the right maxilla, no change from 11/26/2011. There is opacification of a single mastoid air cell. No calvarial fracture  is identified. CT CERVICAL SPINE FINDINGS Loss of disc height at C5-6 and C6-7 with associated posterior osseous ridging. Uncinate spurring causes moderate right foraminal impingement at C5-6 and borderline right foraminal impingement at C6-7. No cervical spine fracture or malalignment identified. No prevertebral soft tissue swelling. IMPRESSION: 1. No acute intracranial findings or acute cervical spine findings. 2. Small remote infarcts involving the right insular cortex inferiorly, and in the right parietal lobe, no change from 03/07/2015. 3. Remote fracture the frontal process of the right maxilla, no change from 11/26/2011. 4. Moderate bony right foraminal impingement at C5-6 due to uncinate spurring. 5. Left frontal forehead scalp hematoma. Electronically Signed   By: Van Clines M.D.   On: 01/06/2016 14:20   I have personally reviewed and evaluated these images and lab results as part of my medical decision-making.   EKG Interpretation   Date/Time:  Tuesday January 06 2016 11:18:50 EDT Ventricular Rate:  75 PR Interval:    QRS Duration: 136 QT Interval:  426 QTC  Calculation: 475 R Axis:   -81 Text Interpretation:  Atrial fibrillation Left axis deviation Non-specific  intra-ventricular conduction block Inferior infarct , age undetermined  Cannot rule out Anterior infarct , age undetermined Abnormal ECG No  significant change since last tracing Confirmed by Lonia Skinner ([id number])  on 01/06/2016 12:25:32 PM      MDM  Patient was seen and evaluated in stable condition. Benign neurologic examination. Patient was observed in the emergency department for several hours with no recurrence of symptoms and it has been over 12 hours since episode. Patient felt well. Laboratory studies were unremarkable other than a urine that looked concerning for infection. Patient was prescribed Keflex. Patient was at her baseline and felt well. At this time had a lengthy discussion with the patient and her daughter. As it appears patient would benefit from seeing her own cardiologist and can stay with her daughter this evening and discuss repeat echo and possible carotid Dopplers with her cardiologist tomorrow the decision was made for discharge. Patient was instructed to return immediately for any shortness of breath, chest pain, palpitations, neurologic changes, recurrence of syncope. She and her daughter expressed understanding and agreement with plan of care at this time. Patient was discharged home in stable condition in the care of her daughter. Final diagnoses:  Syncope, unspecified syncope type  UTI (lower urinary tract infection)    1. Syncope 2. Forehead hematoma 3. UTI    Harvel Quale, MD 01/07/16 [phone]

## 2016-01-06 NOTE — ED Notes (Signed)
Walked Pt. To restroom   Pt. Walked steady gait with no reports of dizziness or visual disturbance.  Pt. Alert and oriented.

## 2016-01-06 NOTE — Discharge Instructions (Signed)
You were seen and evaluated today after your episode of passing out. You do not have any traumatic injuries to your head. You do have a rib fracture versus contusion on examination. Please use the incentive spirometer and pain medications prescribed. Your urine does appear consistent with infection. Please take the antibiotics prescribed. Inquiry with your cardiologist whether or not they feel you need a new or repeat ultrasound of your heart or carotid arteries.  Syncope Syncope is a medical term for fainting or passing out. This means you lose consciousness and drop to the ground. People are generally unconscious for less than 5 minutes. You may have some muscle twitches for up to 15 seconds before waking up and returning to normal. Syncope occurs more often in older adults, but it can happen to anyone. While most causes of syncope are not dangerous, syncope can be a sign of a serious medical problem. It is important to seek medical care.  CAUSES  Syncope is caused by a sudden drop in blood flow to the brain. The specific cause is often not determined. Factors that can bring on syncope include:  Taking medicines that lower blood pressure.  Sudden changes in posture, such as standing up quickly.  Taking more medicine than prescribed.  Standing in one place for too long.  Seizure disorders.  Dehydration and excessive exposure to heat.  Low blood sugar (hypoglycemia).  Straining to have a bowel movement.  Heart disease, irregular heartbeat, or other circulatory problems.  Fear, emotional distress, seeing blood, or severe pain. SYMPTOMS  Right before fainting, you may:  Feel dizzy or light-headed.  Feel nauseous.  See all white or all black in your field of vision.  Have cold, clammy skin. DIAGNOSIS  Your health care provider will ask about your symptoms, perform a physical exam, and perform an electrocardiogram (ECG) to record the electrical activity of your heart. Your health  care provider may also perform other heart or blood tests to determine the cause of your syncope which may include:  Transthoracic echocardiogram (TTE). During echocardiography, sound waves are used to evaluate how blood flows through your heart.  Transesophageal echocardiogram (TEE).  Cardiac monitoring. This allows your health care provider to monitor your heart rate and rhythm in real time.  Holter monitor. This is a portable device that records your heartbeat and can help diagnose heart arrhythmias. It allows your health care provider to track your heart activity for several days, if needed.  Stress tests by exercise or by giving medicine that makes the heart beat faster. TREATMENT  In most cases, no treatment is needed. Depending on the cause of your syncope, your health care provider may recommend changing or stopping some of your medicines. HOME CARE INSTRUCTIONS  Have someone stay with you until you feel stable.  Do not drive, use machinery, or play sports until your health care provider says it is okay.  Keep all follow-up appointments as directed by your health care provider.  Lie down right away if you start feeling like you might faint. Breathe deeply and steadily. Wait until all the symptoms have passed.  Drink enough fluids to keep your urine clear or pale yellow.  If you are taking blood pressure or heart medicine, get up slowly and take several minutes to sit and then stand. This can reduce dizziness. SEEK IMMEDIATE MEDICAL CARE IF:   You have a severe headache.  You have unusual pain in the chest, abdomen, or back.  You are bleeding from your mouth  or rectum, or you have black or tarry stool.  You have an irregular or very fast heartbeat.  You have pain with breathing.  You have repeated fainting or seizure-like jerking during an episode.  You faint when sitting or lying down.  You have confusion.  You have trouble walking.  You have severe  weakness.  You have vision problems. If you fainted, call your local emergency services (911 in [place] Do not drive yourself to the hospital.    This information is not intended to replace advice given to you by your health care provider. Make sure you discuss any questions you have with your health care provider.   Document Released: 06/07/2005 Document Revised: 10/22/2014 Document Reviewed: 08/06/2011 Elsevier Interactive Patient Education 2016 [employer]   Urinary Tract Infection Urinary tract infections (UTIs) can develop anywhere along your urinary tract. Your urinary tract is your body's drainage system for removing wastes and extra water. Your urinary tract includes two kidneys, two ureters, a bladder, and a urethra. Your kidneys are a pair of bean-shaped organs. Each kidney is about the size of your fist. They are located below your ribs, one on each side of your spine. CAUSES Infections are caused by microbes, which are microscopic organisms, including fungi, viruses, and bacteria. These organisms are so small that they can only be seen through a microscope. Bacteria are the microbes that most commonly cause UTIs. SYMPTOMS  Symptoms of UTIs may vary by age and gender of the patient and by the location of the infection. Symptoms in young women typically include a frequent and intense urge to urinate and a painful, burning feeling in the bladder or urethra during urination. Older women and men are more likely to be tired, shaky, and weak and have muscle aches and abdominal pain. A fever may mean the infection is in your kidneys. Other symptoms of a kidney infection include pain in your back or sides below the ribs, nausea, and vomiting. DIAGNOSIS To diagnose a UTI, your caregiver will ask you about your symptoms. Your caregiver will also ask you to provide a urine sample. The urine sample will be tested for bacteria and white blood cells. White blood cells are made by your body to help  fight infection. TREATMENT  Typically, UTIs can be treated with medication. Because most UTIs are caused by a bacterial infection, they usually can be treated with the use of antibiotics. The choice of antibiotic and length of treatment depend on your symptoms and the type of bacteria causing your infection. HOME CARE INSTRUCTIONS  If you were prescribed antibiotics, take them exactly as your caregiver instructs you. Finish the medication even if you feel better after you have only taken some of the medication.  Drink enough water and fluids to keep your urine clear or pale yellow.  Avoid caffeine, tea, and carbonated beverages. They tend to irritate your bladder.  Empty your bladder often. Avoid holding urine for long periods of time.  Empty your bladder before and after sexual intercourse.  After a bowel movement, women should cleanse from front to back. Use each tissue only once. SEEK MEDICAL CARE IF:   You have back pain.  You develop a fever.  Your symptoms do not begin to resolve within 3 days. SEEK IMMEDIATE MEDICAL CARE IF:   You have severe back pain or lower abdominal pain.  You develop chills.  You have nausea or vomiting.  You have continued burning or discomfort with urination. MAKE SURE YOU:  Understand these instructions.  Will watch your condition.  Will get help right away if you are not doing well or get worse.   This information is not intended to replace advice given to you by your health care provider. Make sure you discuss any questions you have with your health care provider.   Document Released: 03/17/2005 Document Revised: 02/26/2015 Document Reviewed: 07/16/2011 Elsevier Interactive Patient Education 2016 [place].  Rib Fracture A rib fracture is a break or crack in one of the bones of the ribs. The ribs are a group of long, curved bones that wrap around your chest and attach to your spine. They protect your lungs and other organs in the  chest cavity. A broken or cracked rib is often painful, but most do not cause other problems. Most rib fractures heal on their own over time. However, rib fractures can be more serious if multiple ribs are broken or if broken ribs move out of place and push against other structures. CAUSES   A direct blow to the chest. For example, this could happen during contact sports, a car accident, or a fall against a hard object.  Repetitive movements with high force, such as pitching a baseball or having severe coughing spells. SYMPTOMS   Pain when you breathe in or cough.  Pain when someone presses on the injured area. DIAGNOSIS  Your caregiver will perform a physical exam. Various imaging tests may be ordered to confirm the diagnosis and to look for related injuries. These tests may include a chest X-ray, computed tomography (CT), magnetic resonance imaging (MRI), or a bone scan. TREATMENT  Rib fractures usually heal on their own in 1-3 months. The longer healing period is often associated with a continued cough or other aggravating activities. During the healing period, pain control is very important. Medication is usually given to control pain. Hospitalization or surgery may be needed for more severe injuries, such as those in which multiple ribs are broken or the ribs have moved out of place.  HOME CARE INSTRUCTIONS   Avoid strenuous activity and any activities or movements that cause pain. Be careful during activities and avoid bumping the injured rib.  Gradually increase activity as directed by your caregiver.  Only take over-the-counter or prescription medications as directed by your caregiver. Do not take other medications without asking your caregiver first.  Apply ice to the injured area for the first 1-2 days after you have been treated or as directed by your caregiver. Applying ice helps to reduce inflammation and pain.  Put ice in a plastic bag.  Place a towel between your skin and  the bag.   Leave the ice on for 15-20 minutes at a time, every 2 hours while you are awake.  Perform deep breathing as directed by your caregiver. This will help prevent pneumonia, which is a common complication of a broken rib. Your caregiver may instruct you to:  Take deep breaths several times a day.  Try to cough several times a day, holding a pillow against the injured area.  Use a device called an incentive spirometer to practice deep breathing several times a day.  Drink enough fluids to keep your urine clear or pale yellow. This will help you avoid constipation.   Do not wear a rib belt or binder. These restrict breathing, which can lead to pneumonia.  SEEK IMMEDIATE MEDICAL CARE IF:   You have a fever.   You have difficulty breathing or shortness of breath.  You develop a continual cough, or you cough up thick or bloody sputum.  You feel sick to your stomach (nausea), throw up (vomit), or have abdominal pain.   You have worsening pain not controlled with medications.  MAKE SURE YOU:  Understand these instructions.  Will watch your condition.  Will get help right away if you are not doing well or get worse.   This information is not intended to replace advice given to you by your health care provider. Make sure you discuss any questions you have with your health care provider.   Document Released: 06/07/2005 Document Revised: 02/07/2013 Document Reviewed: 08/09/2012 Elsevier Interactive Patient Education [employer].

## 2016-01-07 LAB — URINE CULTURE

## 2016-08-01 ENCOUNTER — Emergency Department (HOSPITAL_COMMUNITY): Payer: Medicare Other

## 2016-08-01 ENCOUNTER — Encounter (HOSPITAL_COMMUNITY): Payer: Self-pay

## 2016-08-01 ENCOUNTER — Observation Stay (HOSPITAL_COMMUNITY)
Admission: EM | Admit: 2016-08-01 | Discharge: 2016-08-02 | Disposition: A | Discharge status: Home / Self Care | Payer: Medicare Other | Attending: Internal Medicine | Admitting: Internal Medicine

## 2016-08-01 DIAGNOSIS — Z853 Personal history of malignant neoplasm of breast: Secondary | ICD-10-CM | POA: Insufficient documentation

## 2016-08-01 DIAGNOSIS — I110 Hypertensive heart disease with heart failure: Secondary | ICD-10-CM | POA: Insufficient documentation

## 2016-08-01 DIAGNOSIS — R4781 Slurred speech: Secondary | ICD-10-CM | POA: Insufficient documentation

## 2016-08-01 DIAGNOSIS — I482 Chronic atrial fibrillation, unspecified: Secondary | ICD-10-CM | POA: Diagnosis present

## 2016-08-01 DIAGNOSIS — N390 Urinary tract infection, site not specified: Secondary | ICD-10-CM | POA: Diagnosis not present

## 2016-08-01 DIAGNOSIS — I480 Paroxysmal atrial fibrillation: Secondary | ICD-10-CM | POA: Diagnosis not present

## 2016-08-01 DIAGNOSIS — I5032 Chronic diastolic (congestive) heart failure: Secondary | ICD-10-CM | POA: Diagnosis not present

## 2016-08-01 DIAGNOSIS — Z79811 Long term (current) use of aromatase inhibitors: Secondary | ICD-10-CM | POA: Insufficient documentation

## 2016-08-01 DIAGNOSIS — Z79899 Other long term (current) drug therapy: Secondary | ICD-10-CM | POA: Diagnosis not present

## 2016-08-01 DIAGNOSIS — F329 Major depressive disorder, single episode, unspecified: Secondary | ICD-10-CM | POA: Diagnosis not present

## 2016-08-01 DIAGNOSIS — I5022 Chronic systolic (congestive) heart failure: Secondary | ICD-10-CM | POA: Diagnosis not present

## 2016-08-01 DIAGNOSIS — E785 Hyperlipidemia, unspecified: Secondary | ICD-10-CM | POA: Diagnosis not present

## 2016-08-01 DIAGNOSIS — G459 Transient cerebral ischemic attack, unspecified: Secondary | ICD-10-CM | POA: Diagnosis not present

## 2016-08-01 DIAGNOSIS — Z8673 Personal history of transient ischemic attack (TIA), and cerebral infarction without residual deficits: Secondary | ICD-10-CM | POA: Insufficient documentation

## 2016-08-01 DIAGNOSIS — I10 Essential (primary) hypertension: Secondary | ICD-10-CM | POA: Diagnosis not present

## 2016-08-01 DIAGNOSIS — H539 Unspecified visual disturbance: Secondary | ICD-10-CM | POA: Diagnosis present

## 2016-08-01 DIAGNOSIS — F419 Anxiety disorder, unspecified: Secondary | ICD-10-CM | POA: Diagnosis not present

## 2016-08-01 DIAGNOSIS — J029 Acute pharyngitis, unspecified: Secondary | ICD-10-CM | POA: Diagnosis not present

## 2016-08-01 DIAGNOSIS — R42 Dizziness and giddiness: Secondary | ICD-10-CM | POA: Insufficient documentation

## 2016-08-01 DIAGNOSIS — Z7901 Long term (current) use of anticoagulants: Secondary | ICD-10-CM | POA: Diagnosis not present

## 2016-08-01 DIAGNOSIS — C50919 Malignant neoplasm of unspecified site of unspecified female breast: Secondary | ICD-10-CM | POA: Diagnosis present

## 2016-08-01 DIAGNOSIS — F32A Depression, unspecified: Secondary | ICD-10-CM | POA: Diagnosis present

## 2016-08-01 HISTORY — DX: Unspecified hearing loss, unspecified ear: H91.90

## 2016-08-01 HISTORY — DX: Depression, unspecified: F32.A

## 2016-08-01 HISTORY — DX: Major depressive disorder, single episode, unspecified: F32.9

## 2016-08-01 HISTORY — DX: Unspecified osteoarthritis, unspecified site: M19.90

## 2016-08-01 HISTORY — DX: Reserved for inherently not codable concepts without codable children: IMO0001

## 2016-08-01 HISTORY — DX: Chronic systolic (congestive) heart failure: I50.22

## 2016-08-01 HISTORY — DX: Malignant neoplasm of unspecified site of unspecified female breast: C50.919

## 2016-08-01 LAB — COMPREHENSIVE METABOLIC PANEL
ALT: 21 U/L (ref 14–54)
AST: 23 U/L (ref 15–41)
Albumin: 3.8 g/dL (ref 3.5–5.0)
Alkaline Phosphatase: 45 U/L (ref 38–126)
Anion gap: 10 (ref 5–15)
BUN: 23 mg/dL — ABNORMAL HIGH (ref 6–20)
CO2: 25 mmol/L (ref 22–32)
Calcium: 9.7 mg/dL (ref 8.9–10.3)
Chloride: 103 mmol/L (ref 101–111)
Creatinine, Ser: 0.78 mg/dL (ref 0.44–1.00)
GFR calc Af Amer: >60 mL/min (ref >60)
GFR calc non Af Amer: >60 mL/min (ref >60)
Glucose, Bld: 116 mg/dL — ABNORMAL HIGH (ref 65–99)
Potassium: 4.4 mmol/L (ref 3.5–5.1)
Sodium: 138 mmol/L (ref 135–145)
Total Bilirubin: 1.3 mg/dL — ABNORMAL HIGH (ref 0.3–1.2)
Total Protein: 6.6 g/dL (ref 6.5–8.1)

## 2016-08-01 LAB — CBC WITH DIFFERENTIAL/PLATELET
Basophils Absolute: 0 10*3/uL (ref 0.0–0.1)
Basophils Relative: 0 %
Eosinophils Absolute: 0.2 10*3/uL (ref 0.0–0.7)
Eosinophils Relative: 2 %
HCT: 46.4 % — ABNORMAL HIGH (ref 36.0–46.0)
Hemoglobin: 15.8 g/dL — ABNORMAL HIGH (ref 12.0–15.0)
Lymphocytes Relative: 35 %
Lymphs Abs: 2.4 10*3/uL (ref 0.7–4.0)
MCH: 32.1 pg (ref 26.0–34.0)
MCHC: 34.1 g/dL (ref 30.0–36.0)
MCV: 94.3 fL (ref 78.0–100.0)
Monocytes Absolute: 0.6 10*3/uL (ref 0.1–1.0)
Monocytes Relative: 9 %
Neutro Abs: 3.7 10*3/uL (ref 1.7–7.7)
Neutrophils Relative %: 54 %
Platelets: 191 10*3/uL (ref 150–400)
RBC: 4.92 MIL/uL (ref 3.87–5.11)
RDW: 12.8 % (ref 11.5–15.5)
WBC: 6.8 10*3/uL (ref 4.0–10.5)

## 2016-08-01 LAB — URINALYSIS, ROUTINE W REFLEX MICROSCOPIC
Bilirubin Urine: NEGATIVE
Glucose, UA: NEGATIVE mg/dL
Ketones, ur: NEGATIVE mg/dL
Nitrite: NEGATIVE
Protein, ur: NEGATIVE mg/dL
Specific Gravity, Urine: 1.014 (ref 1.005–1.030)
pH: 6 (ref 5.0–8.0)

## 2016-08-01 MED ORDER — ANASTROZOLE 1 MG PO TABS
1.0000 mg | ORAL_TABLET | Freq: Every day | ORAL | Status: DC
Start: 2016-08-01 — End: 2016-08-02
  Administered 2016-08-01 – 2016-08-02 (×2): 1 mg via ORAL
  Filled 2016-08-01 (×2): qty 1

## 2016-08-01 MED ORDER — ACETAMINOPHEN 325 MG PO TABS: 650.0000 mg | ORAL_TABLET | ORAL | Status: DC | PRN

## 2016-08-01 MED ORDER — SENNOSIDES-DOCUSATE SODIUM 8.6-50 MG PO TABS: 1.0000 | ORAL_TABLET | Freq: Every evening | ORAL | Status: DC | PRN

## 2016-08-01 MED ORDER — ASPIRIN 325 MG PO TABS
325.0000 mg | ORAL_TABLET | Freq: Every day | ORAL | Status: DC
  Administered 2016-08-01: 325 mg via ORAL
  Filled 2016-08-01: qty 1

## 2016-08-01 MED ORDER — STROKE: EARLY STAGES OF RECOVERY BOOK
Freq: Once | Status: AC
  Administered 2016-08-02: 01:00:00
  Filled 2016-08-01: qty 1

## 2016-08-01 MED ORDER — ACETAMINOPHEN 650 MG RE SUPP: 650.0000 mg | RECTAL | Status: DC | PRN

## 2016-08-01 MED ORDER — BISOPROLOL FUMARATE 10 MG PO TABS
10.0000 mg | ORAL_TABLET | Freq: Every day | ORAL | Status: DC
  Administered 2016-08-01 – 2016-08-02 (×2): 10 mg via ORAL
  Filled 2016-08-01 (×2): qty 1

## 2016-08-01 MED ORDER — DULOXETINE HCL 30 MG PO CPEP
30.0000 mg | ORAL_CAPSULE | Freq: Every day | ORAL | Status: DC
  Administered 2016-08-01 – 2016-08-02 (×2): 30 mg via ORAL
  Filled 2016-08-01 (×2): qty 1

## 2016-08-01 MED ORDER — DILTIAZEM HCL ER 60 MG PO CP12
60.0000 mg | ORAL_CAPSULE | Freq: Two times a day (BID) | ORAL | Status: DC
  Administered 2016-08-01 – 2016-08-02 (×2): 60 mg via ORAL
  Filled 2016-08-01 (×3): qty 1

## 2016-08-01 MED ORDER — SPIRONOLACTONE 25 MG PO TABS
25.0000 mg | ORAL_TABLET | Freq: Every day | ORAL | Status: DC
  Administered 2016-08-01 – 2016-08-02 (×2): 25 mg via ORAL
  Filled 2016-08-01 (×2): qty 1

## 2016-08-01 MED ORDER — APIXABAN 5 MG PO TABS
5.0000 mg | ORAL_TABLET | Freq: Two times a day (BID) | ORAL | Status: DC
  Administered 2016-08-01 – 2016-08-02 (×2): 5 mg via ORAL
  Filled 2016-08-01 (×2): qty 1

## 2016-08-01 MED ORDER — ACETAMINOPHEN 160 MG/5ML PO SOLN: 650.0000 mg | ORAL | Status: DC | PRN

## 2016-08-01 MED ORDER — SODIUM CHLORIDE 0.9 % IV SOLN
INTRAVENOUS | Status: DC
  Administered 2016-08-01: 23:00:00 via INTRAVENOUS

## 2016-08-01 MED ORDER — IRBESARTAN 150 MG PO TABS
150.0000 mg | ORAL_TABLET | Freq: Every day | ORAL | Status: DC
Start: 2016-08-01 — End: 2016-08-02
  Administered 2016-08-01 – 2016-08-02 (×2): 150 mg via ORAL
  Filled 2016-08-01 (×2): qty 1

## 2016-08-01 NOTE — H&P (Signed)
History and Physical:    Natalie Mendoza   [id number] DOB: [birth year] DOA: 08/01/2016  Referring MD/provider: Dalia Heading, PA-C PCP: Cari Caraway, MD   Patient coming from: Home  Chief Complaint: Sore throat and slurred speech  History of Present Illness:   Natalie Mendoza is an 83 y.o. female with a PMH of atrial fibrillation on chronic Eliquis (failed cardioversion attempts 3), hypertension, depression, breast cancer status post lumpectomy on chronic Arimidex, and prior right sided stroke in 2014. She had a full stroke workup at that time including carotid Dopplers which showed 1-39 percent stenosis bilaterally, a 2-D echocardiogram which showed an EF of 45-50 percent with septal and inferobasal hypokinesis.Patient also had a repeat 2-D echo done at [facility] on 07/12/16 which showed an EF of 54% and no appreciable changes from baseline 2-D echo. Patient states that she developed a sore throat for which she went to an urgent care center and had a rapid strep test done. She was talking to her daughter on the telephone when her daughter noticed that her speech sounded slurred. The patient denies any associated symptoms such as dysphagia, diplopia, loss of power or other focal neurological deficits. Slurred speech lasted approximately 20 minutes. No aggravating or alleviating factors. With regard to her sore throat, she has had this symptom for approximately 24 hours but no associated fever, chills, or myalgias. No known sick contacts.  ED Course:  The patient underwent an MRI of her brain which showed no acute intracranial process, but did show old old right frontal and parietal/MCA territory infarcts and mild to moderate chronic small vessel ischemic disease. 12-lead EKG showed atrial fibrillation. Neurology was consulted and the patient was referred for observation stay.  ROS:   Review of Systems  Constitutional: Negative for chills, fever and weight loss.  HENT: Positive for  hearing loss and sore throat. Negative for congestion.   Eyes: Negative.   Respiratory: Negative for cough, sputum production and shortness of breath.   Cardiovascular: Negative.   Gastrointestinal: Negative.   Genitourinary: Negative.   Musculoskeletal: Negative.   Skin: Negative.   Neurological: Positive for dizziness. Negative for headaches.  Endo/Heme/Allergies: Negative.   Psychiatric/Behavioral: Positive for depression. The patient is nervous/anxious.     Past Medical History:   Past Medical History:  Diagnosis Date  . Arrhythmia    Atrial Fib  . Atrial fibrillation ([place])   . Breast cancer ([place])   . CHF (congestive heart failure) ([place])   . Chronic systolic CHF (congestive heart failure) ([place])   . Depression   . Hearing impairment   . Hx of joint problems   . Hypertension   . Osteoarthritis   . Osteoporosis   . Pelvic relaxation   . Stroke [facility])    fully resolved TIA  . Vaginal pessary present     Past Surgical History:   Past Surgical History:  Procedure Laterality Date  . BREAST LUMPECTOMY    . CATARACT EXTRACTION W/ INTRAOCULAR LENS  IMPLANT, BILATERAL Bilateral     Social History:   Social History   Social History  . Marital status: Widowed    Spouse name: N/A  . Number of children: N/A  . Years of education: N/A   Occupational History  . Not on file.   Social History Main Topics  . Smoking status: Never Smoker  . Smokeless tobacco: Never Used  . Alcohol use No  . Drug use: No  . Sexual activity: No   Other Topics Concern  .  Not on file   Social History Narrative   Independent of ADLs and with ambulation.    Allergies   Patient has no known allergies.  Family history:   Family History  Problem Relation Age of Onset  . Hypertension Mother   . Stroke Mother   . Ovarian cancer Maternal Grandmother     Current Medications:   spironolactone (ALDACTONE) 25 MG tablet  Take 1 tablet (25 mg total) by mouth once daily. 30 tablet   11 11/20/2015 11/19/2016 Active  valsartan (DIOVAN) 160 MG tablet  Indications: Essential hypertension with goal blood pressure less than 130/80 Take 1 tablet (160 mg total) by mouth once daily. 30 tablet  11 01/07/2016  Active  DULoxetine (CYMBALTA) 30 MG DR capsule  take 1 capsule by mouth once daily 90 capsule  3 02/03/2016  Active  bisoprolol (ZEBETA) 10 MG tablet  Indications: Essential hypertension with goal blood pressure less than 140/90 TAKE 1 TABLET BY MOUTH AFTER DINNER 90 tablet  3 02/24/2016  Active  anastrozole (ARIMIDEX) 1 mg tablet  TAKE 1 TABLET BY MOUTH ONCE DAILY 90 tablet  3 03/29/2016  Active  amLODIPine (NORVASC) 2.5 MG tablet  Take 1 tablet (2.5 mg total) by mouth once daily. 90 tablet  3 05/04/2016  Active  diltiazem (CARDIZEM SR) 60 MG 12 hr capsule  Take 1 capsule (60 mg total) by mouth once daily. 90 capsule  3 07/12/2016  Active  risedronate (ACTONEL) 35 MG tablet  Take 1 tablet (35 mg total) by mouth every 7 (seven) days. 24 tablet  0 07/12/2016  Active  FUROsemide (LASIX) 20 MG tablet  take 1 tablet by mouth once daily 180 tablet  01 07/21/2016  Active  apixaban (ELIQUIS) 5 mg tablet  Take 1 tablet (5 mg total) by mouth 2 (two) times daily. 180 tablet  1 07/22/2016 07/22/2017 Active     Physical Exam:   Vitals:   08/01/16 1545 08/01/16 1600 08/01/16 1615 08/01/16 1803  BP: 124/95 126/91 118/94 136/90  Pulse: 67 64 66 (!) 40  Resp:    24  Temp:      TempSrc:      SpO2: 96% 98% 98% 94%  Weight:      Height:         Physical Exam: Blood pressure 136/90, pulse (!) 40, temperature 98.2 F (36.8 C), temperature source Oral, resp. rate 24, height 5\' 6"  (1.676 m), weight 74.4 kg (164 lb), SpO2 94 %. Gen: No acute distress. Head: Normocephalic, atraumatic. Eyes: Pupils equal, round and reactive to light. Extraocular movements intact.  Sclerae nonicteric, but injected bilaterally with prominence of the right eye compared to the left. No  lid lag. Mouth: Oropharynx reveals posterior pharyngeal erythema. Dentition is fair. Neck: Supple, no thyromegaly, no lymphadenopathy, no jugular venous distention. Chest: Lungs are clear to auscultation with good air movement. No rales, rhonchi or wheezes.  CV: Heart sounds are irregular. No murmurs, rubs, clicks, or gallops.  Abdomen: Soft, nontender, nondistended with normal active bowel sounds. No hepatosplenomegaly or palpable masses. Extremities: Extremities are without clubbing, or cyanosis. Varicosities present. No edema. Pedal pulses 2+.  Skin: Warm and dry. No rashes, lesions or wounds. Neuro: Alert and oriented times 3; grossly nonfocal. Moves all extremities with equal strength, no pronator drift. Cranial nerves II through XII are grossly intact although the patient is hard of hearing. Psych: Insight is good and judgment is appropriate. Mood and affect irritable.   Data Review:    Labs: Basic  Metabolic Panel:  Recent Labs Lab 08/01/16 1409  NA 138  K 4.4  CL 103  CO2 25  GLUCOSE 116*  BUN 23*  CREATININE 0.78  CALCIUM 9.7   Liver Function Tests:  Recent Labs Lab 08/01/16 1409  AST 23  ALT 21  ALKPHOS 45  BILITOT 1.3*  PROT 6.6  ALBUMIN 3.8   CBC:  Recent Labs Lab 08/01/16 1409  WBC 6.8  NEUTROABS 3.7  HGB 15.8*  HCT 46.4*  MCV 94.3  PLT 191    Urinalysis    Component Value Date/Time   COLORURINE YELLOW 08/01/2016 1525   APPEARANCEUR HAZY (A) 08/01/2016 1525   LABSPEC 1.014 08/01/2016 1525   PHURINE 6.0 08/01/2016 1525   GLUCOSEU NEGATIVE 08/01/2016 1525   HGBUR SMALL (A) 08/01/2016 1525   BILIRUBINUR NEGATIVE 08/01/2016 1525   BILIRUBINUR negative 09/01/2012 1506   KETONESUR NEGATIVE 08/01/2016 1525   PROTEINUR NEGATIVE 08/01/2016 1525   UROBILINOGEN 0.2 09/27/2014 1145   NITRITE NEGATIVE 08/01/2016 1525   LEUKOCYTESUR LARGE (A) 08/01/2016 1525      Radiographic Studies: Mr Brain Wo Contrast  Result Date: 08/01/2016 CLINICAL  DATA:  20 minute episode of visual disturbances and slurred speech. History of hypertension, atrial fibrillation, breast cancer, stroke. EXAM: MRI HEAD WITHOUT CONTRAST TECHNIQUE: Multiplanar, multiecho pulse sequences of the brain and surrounding structures were obtained without intravenous contrast. COMPARISON:  CT HEAD January 06, 2016 and MRI head March 14, 2013 FINDINGS: Multiple sequences are moderately motion degraded. BRAIN: No reduced diffusion to suggest acute ischemia. No susceptibility artifact to suggest hemorrhage. The ventricles and sulci are normal for patient's age. Old small RIGHT frontal/ insular infarct. Old small RIGHT parietal infarct. Patchy supratentorial and pontine white matter FLAIR T2 hyperintensities. No suspicious parenchymal signal, masses or mass effect. No abnormal extra-axial fluid collections. VASCULAR: Normal major intracranial vascular flow voids present at skull base. SKULL AND UPPER CERVICAL SPINE: No abnormal sellar expansion. No suspicious calvarial bone marrow signal. Craniocervical junction maintained. SINUSES/ORBITS: Trace paranasal sinus and LEFT mastoid effusions. Status post bilateral ocular lens implants. The included ocular globes and orbital contents are non-suspicious. OTHER: None. IMPRESSION: No acute intracranial process on this motion degraded examination. Old RIGHT frontal and parietal/MCA territory infarcts. Mild to moderate chronic small vessel ischemic disease. Electronically Signed   By: Elon Alas M.D.   On: 08/01/2016 17:31    EKG: Independently reviewed. Atrial fibrillation with controlled heart rate.   Assessment/Plan:   Principal Problem:   TIA (transient ischemic attack) In a patient with a PMH of stroke MRI negative for acute findings. Admitted to telemetry. Neurology consulted. TPA not given because the patient had rapid resolution of symptoms. The patient is anticoagulated with Eliquis.  We'll add a daily aspirin. Check FLP and  hemoglobin A1c. No need to repeat 2-D echo as she just had this done within the last month. Repeat carotid Dopplers. RN to perform stroke swallowing screen and if patient passes, placed appropriate diet order. Neuro checks every 2 hours 12 hours then every 4 hours. PT/OT evaluations.  Active Problems:   Breast cancer ([place]) Continue Arimidex.    HTN (hypertension) Continue spironolactone, Avapro, Zebeta, and Cardizem. Will hold Norvasc and Lasix for now.    Depression Continue Cymbalta.    Chronic atrial fibrillation (HCC) Currently rate controlled and on chronic anticoagulation.    Chronic systolic CHF (congestive heart failure) (HCC) Currently compensated. Continue spironolactone and Zebeta.  Hold Lasix.    Sore throat Check influenza panel.  Other  information:   DVT prophylaxis: Eliquis ordered. Code Status: Full code. Family Communication: Multiple family updated at the bedside. Disposition Plan: Home tomorrow. Consults called: Neurology (EDP spoke with Dr. Alver Fisher) Admission status: Observation  The medical decision making on this patient was of high complexity and the patient is at high risk for clinical deterioration, therefore this is a level 3 visit.   Fletcher Ostermiller [facility] Pager [phone] Cell: [phone]   If 7PM-7AM, please contact night-coverage [email or web address] Password [facility] 08/01/2016, 6:27 PM

## 2016-08-01 NOTE — ED Triage Notes (Addendum)
Patient here by [facility] from [facility], she went there for speech impairment and some blurry vision that started at 0830 that resolved. On arrival speech clear and vision clear, here for evaluation because has had previous TIA. No complaints

## 2016-08-01 NOTE — ED Provider Notes (Signed)
[facility] DEPT Provider Note   CSN: [id number] Arrival date & time: 08/01/16  1311     History   Chief Complaint Chief Complaint  Patient presents with  . R/O TIA    HPI Natalie Mendoza is a 83 y.o. female.  HPI Patient presents to the emergency department with visual disturbance and slurring of her speech that lasted for 20 minutes.  The patient states that she was on the phone with her daughter and was concerned about a sore throat that she had and wanted to go to an urgent care and the daughter stated that she was slurring her speech.  The patient states that she did not feel like she was having difficulty with speaking.  She thought she is speaking in her normal voice.  Patient states that those symptoms have resolved. The patient denies chest pain, shortness of breath, headache,blurred vision, neck pain, fever, cough, weakness, numbness, dizziness, anorexia, edema, abdominal pain, nausea, vomiting, diarrhea, rash, back pain, dysuria, hematemesis, bloody stool, near syncope, or syncope. Past Medical History:  Diagnosis Date  . Arrhythmia    Atrial Fib  . Atrial fibrillation ([place])   . CHF (congestive heart failure) ([place])   . Hx of joint problems   . Hypertension   . Osteoporosis   . Pelvic relaxation   . Stroke [facility])    fully resolved TIA  . Vaginal pessary present     Patient Active Problem List   Diagnosis Date Noted  . Chronic diastolic heart failure ([place]) 12/11/2014  . Left-sided weakness 03/13/2013  . TIA (transient ischemic attack) 03/13/2013  . HTN (hypertension) 03/13/2013  . Dyslipidemia 03/13/2013  . Depression 03/13/2013  . Breast cancer ([place]) 09/01/2012  . Deafness, sensorineural 07/31/2012  . AF (paroxysmal atrial fibrillation) ([place]) 03/17/2011  . Anxiety 03/17/2011  . Arthropathia 11/28/2007    Past Surgical History:  Procedure Laterality Date  . BREAST LUMPECTOMY      OB History    Gravida Para Term Preterm AB Living   2 2           SAB TAB Ectopic Multiple Live Births                   Home Medications    Prior to Admission medications   Medication Sig Start Date End Date Taking? Authorizing Provider  anastrozole (ARIMIDEX) 1 MG tablet Take 1 mg by mouth daily.    Historical Provider, MD  apixaban (ELIQUIS) 5 MG TABS tablet Take 1 tablet (5 mg total) by mouth 2 (two) times daily. 03/15/13   Monika Salk, MD  bisoprolol (ZEBETA) 10 MG tablet Take by mouth. 12/05/14 12/06/15  Historical Provider, MD  cephALEXin (KEFLEX) 500 MG capsule Take 1 capsule (500 mg total) by mouth 2 (two) times daily. 01/06/16   Harvel Quale, MD  diltiazem (CARDIZEM SR) 60 MG 12 hr capsule Take 1 capsule (60 mg total) by mouth every 12 (twelve) hours. 03/15/13   Monika Salk, MD  DULoxetine (CYMBALTA) 30 MG capsule Take 30 mg by mouth daily.    Historical Provider, MD  furosemide (LASIX) 20 MG tablet Take by mouth. 12/11/14 12/11/15  Historical Provider, MD  risedronate (ACTONEL) 35 MG tablet Take 1 tablet (35 mg total) by mouth every 7 (seven) days. with water on empty stomach, nothing by mouth or lie down for next 30 minutes. 09/01/12   Eldred Manges, MD  traMADol (ULTRAM) 50 MG tablet Take 1 tablet (50 mg total) by mouth  every 6 (six) hours as needed for moderate pain or severe pain. 01/06/16   Harvel Quale, MD  valsartan (DIOVAN) 320 MG tablet Take 320 mg by mouth daily.    Historical Provider, MD    Family History No family history on file.  Social History Social History  Substance Use Topics  . Smoking status: Never Smoker  . Smokeless tobacco: Never Used  . Alcohol use No     Allergies   Patient has no known allergies.   Review of Systems Review of Systems All other systems negative except as documented in the HPI. All pertinent positives and negatives as reviewed in the HPI.  Physical Exam Updated Vital Signs BP 123/84   Pulse 67   Temp 98.2 F (36.8 C) (Oral)   Resp 16   Ht 5\' 6"  (1.676 m)   Wt 74.4 kg   SpO2  96%   BMI 26.47 kg/m   Physical Exam  Constitutional: She is oriented to person, place, and time. She appears well-developed and well-nourished. No distress.  HENT:  Head: Normocephalic and atraumatic.  Mouth/Throat: Oropharynx is clear and moist.  Eyes: Pupils are equal, round, and reactive to light.  Neck: Normal range of motion. Neck supple.  Cardiovascular: Normal rate, regular rhythm and normal heart sounds.  Exam reveals no gallop and no friction rub.   No murmur heard. Pulmonary/Chest: Effort normal and breath sounds normal. No respiratory distress. She has no wheezes.  Abdominal: Soft. Bowel sounds are normal. She exhibits no distension. There is no tenderness.  Neurological: She is alert and oriented to person, place, and time. She has normal strength. No sensory deficit. She exhibits normal muscle tone. Coordination and gait normal. GCS eye subscore is 4. GCS verbal subscore is 5. GCS motor subscore is 6.  Skin: Skin is warm and dry. No rash noted. No erythema.  Psychiatric: She has a normal mood and affect. Her behavior is normal.  Nursing note and vitals reviewed.    ED Treatments / Results  Labs (all labs ordered are listed, but only abnormal results are displayed) Labs Reviewed  COMPREHENSIVE METABOLIC PANEL - Abnormal; Notable for the following:       Result Value   Glucose, Bld 116 (*)    BUN 23 (*)    Total Bilirubin 1.3 (*)    All other components within normal limits  CBC WITH DIFFERENTIAL/PLATELET - Abnormal; Notable for the following:    Hemoglobin 15.8 (*)    HCT 46.4 (*)    All other components within normal limits  URINALYSIS, ROUTINE W REFLEX MICROSCOPIC - Abnormal; Notable for the following:    APPearance HAZY (*)    Hgb urine dipstick SMALL (*)    Leukocytes, UA LARGE (*)    Bacteria, UA RARE (*)    Squamous Epithelial / LPF 0-5 (*)    All other components within normal limits    EKG  EKG Interpretation None       Radiology No results  found.  Procedures Procedures (including critical care time)  Medications Ordered in ED Medications - No data to display   Initial Impression / Assessment and Plan / ED Course  I have reviewed the triage vital signs and the nursing notes.  Pertinent labs & imaging results that were available during my care of the patient were reviewed by me and considered in my medical decision making (see chart for details).     When speaking with the patient.  She does seem to  have some trouble formulating sentences.  I do not know if this is a new finding or not.  The patient will have this MRI to look for any stroke or other abnormality that would cause her symptoms.  I will speak with the neurologist that the patient is anticoagulated and has had a recent echo and carotids done in the last 2 years.   Final Clinical Impressions(s) / ED Diagnoses   Final diagnoses:  None    New Prescriptions New Prescriptions   No medications on file     Dalia Heading, PA-C 08/01/16 1627    Pattricia Boss, MD 08/03/16 1110

## 2016-08-01 NOTE — Consult Note (Signed)
NEURO HOSPITALIST CONSULT NOTE   Requestig physician: Dr. Rockne Menghini  Reason for Consult: TIA  History obtained from:   Patient and Chart     HPI:                                                                                                                                          Natalie Mendoza is an 83 y.o. female who presented with visual disturbance and slurred speech lasting 20 minutes. She denies having been aphasic or confused. Also denies focal weakness or gait instability. She has a history of atrial fibrillation and is managed with Eliquis. She has a history of right sided stroke in 2014. Stroke work up showed less than 40% stenosis of her carotid arteries and hypokinetic segments on echocardiogram.   At the time of symptom onset, the patient was also experiencing symptoms of a sore throat, but did not have fevers, chills, cough or difficulty breathing.   MRI brain in the ED showed no acute abnormality. Old right frontal and parietal ischemic infarctions and mild/moderate chronic small vessel ischemic changes were noted. EKG revealed atrial fibrillation.   Past Medical History:  Diagnosis Date  . Arrhythmia    Atrial Fib  . Atrial fibrillation ([place])   . Breast cancer ([place])   . CHF (congestive heart failure) ([place])   . Chronic systolic CHF (congestive heart failure) ([place])   . Depression   . Hearing impairment   . Hx of joint problems   . Hypertension   . Osteoarthritis   . Osteoporosis   . Pelvic relaxation   . Stroke [facility])    fully resolved TIA  . Vaginal pessary present     Past Surgical History:  Procedure Laterality Date  . BREAST LUMPECTOMY    . CATARACT EXTRACTION W/ INTRAOCULAR LENS  IMPLANT, BILATERAL Bilateral     Family History  Problem Relation Age of Onset  . Hypertension Mother   . Stroke Mother   . Ovarian cancer Maternal Grandmother    Social History:  reports that she has never smoked. She has never used smokeless tobacco. She  reports that she does not drink alcohol or use drugs.  No Known Allergies  MEDICATIONS:  Current Facility-Administered Medications:  .   stroke: mapping our early stages of recovery book, , Does not apply, Once, Christina P Rama, MD .  0.9 %  sodium chloride infusion, , Intravenous, Continuous, Venetia Maxon Rama, MD, Last Rate: 10 mL/hr at 08/01/16 2321 .  acetaminophen (TYLENOL) tablet 650 mg, 650 mg, Oral, Q4H PRN **OR** acetaminophen (TYLENOL) solution 650 mg, 650 mg, Per Tube, Q4H PRN **OR** acetaminophen (TYLENOL) suppository 650 mg, 650 mg, Rectal, Q4H PRN, Venetia Maxon Rama, MD .  anastrozole (ARIMIDEX) tablet 1 mg, 1 mg, Oral, Daily, Venetia Maxon Rama, MD, 1 mg at 08/01/16 2301 .  apixaban (ELIQUIS) tablet 5 mg, 5 mg, Oral, BID, Venetia Maxon Rama, MD, 5 mg at 08/01/16 2155 .  bisoprolol (ZEBETA) tablet 10 mg, 10 mg, Oral, Daily, Venetia Maxon Rama, MD, 10 mg at 08/01/16 2302 .  diltiazem (CARDIZEM SR) 12 hr capsule 60 mg, 60 mg, Oral, Q12H, Venetia Maxon Rama, MD, 60 mg at 08/01/16 2301 .  DULoxetine (CYMBALTA) DR capsule 30 mg, 30 mg, Oral, Daily, Venetia Maxon Rama, MD, 30 mg at 08/01/16 2156 .  irbesartan (AVAPRO) tablet 150 mg, 150 mg, Oral, Daily, Venetia Maxon Rama, MD, 150 mg at 08/01/16 2155 .  senna-docusate (Senokot-S) tablet 1 tablet, 1 tablet, Oral, QHS PRN, Venetia Maxon Rama, MD .  spironolactone (ALDACTONE) tablet 25 mg, 25 mg, Oral, Daily, Venetia Maxon Rama, MD, 25 mg at 08/01/16 2155   ROS:                                                                                                                                       History obtained from patient. No headache, vision changes, focal weakness, neck pain, chest pain, abdominal pain or limb pain.  Blood pressure 122/83, pulse 63, temperature 98.2 F (36.8 C), resp. rate 16, height 5\' 6"  (1.676 m), weight 74.4 kg (164  lb), SpO2 97 %.   Neurologic Examination:                                                                                                      HEENT-  Normocephalic/atraumatic.  Lungs- No gross wheezing. Respirations unlabored.  Extremities- Warm and well-perfused.   Neurological Examination Mental Status: Speech with normal fluency except for occasional phonemic paraphasias. Comprehension intact. Naming and repetition intact. Fully oriented.  Cranial Nerves: II: Visual fields grossly normal, pupils equal, round, reactive to light  III,IV, VI: ptosis not present, EOMI without nystagmus V,VII: smile symmetric, facial temperature  sensation normal bilaterally VIII: HOH IX,X: No hypophonia XI: Symmetric XII: midline tongue extension Motor: Right : Upper extremity   5/5    Left:     Upper extremity   5/5  Lower extremity   5/5     Lower extremity   5/5 Sensory: Temperature and light touch intact in all 4 extremities without extinction Deep Tendon Reflexes: 2+ and symmetric throughout Plantars: Right: downgoing   Left: downgoing Cerebellar: No ataxia with FNF bilaterally.  Gait: Deferred  Lab Results: Basic Metabolic Panel:  Recent Labs Lab 08/01/16 1409  NA 138  K 4.4  CL 103  CO2 25  GLUCOSE 116*  BUN 23*  CREATININE 0.78  CALCIUM 9.7    Liver Function Tests:  Recent Labs Lab 08/01/16 1409  AST 23  ALT 21  ALKPHOS 45  BILITOT 1.3*  PROT 6.6  ALBUMIN 3.8   No results for input(s): LIPASE, AMYLASE in the last 168 hours. No results for input(s): AMMONIA in the last 168 hours.  CBC:  Recent Labs Lab 08/01/16 1409  WBC 6.8  NEUTROABS 3.7  HGB 15.8*  HCT 46.4*  MCV 94.3  PLT 191    Cardiac Enzymes: No results for input(s): CKTOTAL, CKMB, CKMBINDEX, TROPONINI in the last 168 hours.  Lipid Panel: No results for input(s): CHOL, TRIG, HDL, CHOLHDL, VLDL, LDLCALC in the last 168 hours.  CBG: No results for input(s): GLUCAP in the last 168  hours.  Microbiology: Results for orders placed or performed during the hospital encounter of 01/06/16  Urine culture     Status: Abnormal   Collection Time: 01/06/16  2:55 PM  Result Value Ref Range Status   Specimen Description URINE, CATHETERIZED  Final   Special Requests NONE  Final   Culture MULTIPLE SPECIES PRESENT, SUGGEST RECOLLECTION (A)  Final   Report Status 01/07/2016 FINAL  Final    Coagulation Studies: No results for input(s): LABPROT, INR in the last 72 hours.  Imaging: Mr Brain Wo Contrast  Result Date: 08/01/2016 CLINICAL DATA:  20 minute episode of visual disturbances and slurred speech. History of hypertension, atrial fibrillation, breast cancer, stroke. EXAM: MRI HEAD WITHOUT CONTRAST TECHNIQUE: Multiplanar, multiecho pulse sequences of the brain and surrounding structures were obtained without intravenous contrast. COMPARISON:  CT HEAD January 06, 2016 and MRI head March 14, 2013 FINDINGS: Multiple sequences are moderately motion degraded. BRAIN: No reduced diffusion to suggest acute ischemia. No susceptibility artifact to suggest hemorrhage. The ventricles and sulci are normal for patient's age. Old small RIGHT frontal/ insular infarct. Old small RIGHT parietal infarct. Patchy supratentorial and pontine white matter FLAIR T2 hyperintensities. No suspicious parenchymal signal, masses or mass effect. No abnormal extra-axial fluid collections. VASCULAR: Normal major intracranial vascular flow voids present at skull base. SKULL AND UPPER CERVICAL SPINE: No abnormal sellar expansion. No suspicious calvarial bone marrow signal. Craniocervical junction maintained. SINUSES/ORBITS: Trace paranasal sinus and LEFT mastoid effusions. Status post bilateral ocular lens implants. The included ocular globes and orbital contents are non-suspicious. OTHER: None. IMPRESSION: No acute intracranial process on this motion degraded examination. Old RIGHT frontal and parietal/MCA territory  infarcts. Mild to moderate chronic small vessel ischemic disease. Electronically Signed   By: Elon Alas M.D.   On: 08/01/2016 17:31    Assessment: 1. Transient visual disturbance with slurred speech. TIA is felt to be highest on the DDx. Most likely secondary to atrial fibrillation.  2. Prior history of "TIA" may actually correspond to completed small strokes without residual clinical deficits.  MRI reveals small old strokes but no acute foci of restricted diffusion: Old right frontal and parietal MCA territory infarcts are noted, in addition to mild/moderate chronic small vessel ischemic disease. 3. Atrial fibrillation on Eliquis. May be classifiable as having failed this medication.   Recommendations: 1. MRA of brain, TTE, carotid ultrasound.  2. Continue Eliquis for now. Stroke team to determine in the AM whether she should be switched to an oral anticoagulant of a different class, such as dabigatran or Coumadin. 3. Start atorvastatin 40 mg po qd. Obtain baseline CK level.  4. PT/OT/Speech.  5. Permissive HTN.   Electronically signed: Dr. Kerney Elbe 08/01/2016, 7:44 PM

## 2016-08-01 NOTE — ED Notes (Signed)
Patient transported to MRI 

## 2016-08-01 NOTE — ED Notes (Signed)
Pt returned from MRI and Connected to the monitor

## 2016-08-02 ENCOUNTER — Observation Stay (HOSPITAL_BASED_OUTPATIENT_CLINIC_OR_DEPARTMENT_OTHER): Payer: Medicare Other

## 2016-08-02 DIAGNOSIS — G459 Transient cerebral ischemic attack, unspecified: Secondary | ICD-10-CM

## 2016-08-02 DIAGNOSIS — I10 Essential (primary) hypertension: Secondary | ICD-10-CM

## 2016-08-02 DIAGNOSIS — J029 Acute pharyngitis, unspecified: Secondary | ICD-10-CM

## 2016-08-02 DIAGNOSIS — I482 Chronic atrial fibrillation: Secondary | ICD-10-CM

## 2016-08-02 DIAGNOSIS — N390 Urinary tract infection, site not specified: Secondary | ICD-10-CM | POA: Diagnosis not present

## 2016-08-02 DIAGNOSIS — C50919 Malignant neoplasm of unspecified site of unspecified female breast: Secondary | ICD-10-CM

## 2016-08-02 DIAGNOSIS — E785 Hyperlipidemia, unspecified: Secondary | ICD-10-CM

## 2016-08-02 LAB — LIPID PANEL
Cholesterol: 185 mg/dL (ref 0–200)
HDL: 52 mg/dL (ref >40)
LDL CALC: 106 mg/dL — AB (ref 0–99)
Total CHOL/HDL Ratio: 3.6 RATIO
Triglycerides: 136 mg/dL (ref <150)
VLDL: 27 mg/dL (ref 0–40)

## 2016-08-02 LAB — VAS US CAROTID
LCCADDIAS: -10 cm/s
LCCADSYS: -48 cm/s
LCCAPDIAS: 13 cm/s
LEFT ECA DIAS: -6 cm/s
LEFT VERTEBRAL DIAS: -12 cm/s
LICADSYS: -41 cm/s
LICAPDIAS: -17 cm/s
Left CCA prox sys: 67 cm/s
Left ICA dist dias: -15 cm/s
Left ICA prox sys: -53 cm/s
RCCAPSYS: -53 cm/s
RIGHT ECA DIAS: -8 cm/s
RIGHT VERTEBRAL DIAS: -10 cm/s
Right CCA prox dias: -10 cm/s
Right cca dist sys: -26 cm/s

## 2016-08-02 LAB — INFLUENZA PANEL BY PCR (TYPE A & B)
INFLAPCR: NEGATIVE
Influenza B By PCR: NEGATIVE

## 2016-08-02 MED ORDER — CEFUROXIME AXETIL 500 MG PO TABS: 500.0000 mg | ORAL_TABLET | Freq: Two times a day (BID) | ORAL | 0 refills | Status: DC

## 2016-08-02 MED ORDER — DEXTROSE 5 % IV SOLN
1.0000 g | INTRAVENOUS | Status: DC
  Administered 2016-08-02: 1 g via INTRAVENOUS
  Filled 2016-08-02: qty 10

## 2016-08-02 NOTE — Progress Notes (Addendum)
STROKE TEAM PROGRESS NOTE   HISTORY OF PRESENT ILLNESS (per record) Natalie Mendoza is an 83 y.o. female who presented with visual disturbance and slurred speech lasting 20 minutes. She denies having been aphasic or confused. Also denies focal weakness or gait instability. She has a history of atrial fibrillation and is managed with Eliquis. She has a history of right sided stroke in 2014. Stroke work up showed less than 40% stenosis of her carotid arteries and hypokinetic segments on echocardiogram. At the time of symptom onset, the patient was also experiencing symptoms of a sore throat, but did not have fevers, chills, cough or difficulty breathing.  MRI brain in the ED showed no acute abnormality. Old right frontal and parietal ischemic infarctions and mild/moderate chronic small vessel ischemic changes were noted. EKG revealed atrial fibrillation. Patient was not administered IV t-PA. She was admitted for further evaluation and treatment.   SUBJECTIVE (INTERVAL HISTORY) No family at bedside. Pt sitting in bed, no complains. She stated that she had transient visual disturbance, resembling wavy lines in the central vision with mild dizziness. She contributed her slurry speech to sore throat she had last night. She stated that she did not talk much overnight and now her sore throat is gone and her speech is norma. TIA work up so far negative.    OBJECTIVE Temp:  [97.4 F (36.3 C)-98.6 F (37 C)] 98.6 F (37 C) (02/12 1408) Pulse Rate:  [40-119] 56 (02/12 1408) Cardiac Rhythm: Atrial fibrillation (02/12 0800) Resp:  [14-24] 18 (02/12 1408) BP: (98-136)/(60-101) 98/60 (02/12 1408) SpO2:  [92 %-98 %] 93 % (02/12 1408) Weight:  [74.4 kg (164 lb)] 74.4 kg (164 lb) (02/11 2012)  CBC:  Recent Labs Lab 08/01/16 1409  WBC 6.8  NEUTROABS 3.7  HGB 15.8*  HCT 46.4*  MCV 94.3  PLT 99991111    Basic Metabolic Panel:  Recent Labs Lab 08/01/16 1409  NA 138  K 4.4  CL 103  CO2 25  GLUCOSE 116*   BUN 23*  CREATININE 0.78  CALCIUM 9.7    Lipid Panel:    Component Value Date/Time   CHOL 185 08/02/2016 0411   TRIG 136 08/02/2016 0411   HDL 52 08/02/2016 0411   CHOLHDL 3.6 08/02/2016 0411   VLDL 27 08/02/2016 0411   LDLCALC 106 (H) 08/02/2016 0411   HgbA1c:  Lab Results  Component Value Date   HGBA1C 6.1 (H) 03/14/2013   Urine Drug Screen: No results found for: LABOPIA, COCAINSCRNUR, LABBENZ, AMPHETMU, THCU, LABBARB    IMAGING I have personally reviewed the radiological images below and agree with the radiology interpretations.  Mr Brain Wo Contrast 08/01/2016 No acute intracranial process on this motion degraded examination. Old RIGHT frontal and parietal/MCA territory infarcts. Mild to moderate chronic small vessel ischemic disease.   Carotid Doppler   There is 1-39% bilateral ICA stenosis. Vertebral artery flow is antegrade.    2D Echocardiogram  - Left ventricle: Septal and inferobasal hypokinesis Thecavity size was mildly dilated. Systolic function wasmildly reduced. The estimated ejection fraction was in therange of 45% to 50%. - Left atrium: The atrium was mildly to moderately dilated. - Atrial septum: No defect or patent foramen ovale wasidentified.   PHYSICAL EXAM  Temp:  [97.4 F (36.3 C)-98.6 F (37 C)] 98.6 F (37 C) (02/12 1408) Pulse Rate:  [56-65] 56 (02/12 1408) Resp:  [14-18] 18 (02/12 1408) BP: (98-128)/(60-87) 98/60 (02/12 1408) SpO2:  [92 %-96 %] 93 % (02/12 1408)  General -  Well nourished, well developed, in no apparent distress.  Ophthalmologic - Sharp disc margins OU.   Cardiovascular - irregularly irregular heart rate and rhythm.  Mental Status -  Level of arousal and orientation to time, place, and person were intact. Language including expression, naming, repetition, comprehension was assessed and found intact. Fund of Knowledge was assessed and was intact.  Cranial Nerves II - XII - II - Visual field intact OU. III, IV,  VI - Extraocular movements intact. V - Facial sensation intact bilaterally. VII - Facial movement intact bilaterally. VIII - Hearing & vestibular intact bilaterally. X - Palate elevates symmetrically. XI - Chin turning & shoulder shrug intact bilaterally. XII - Tongue protrusion intact.  Motor Strength - The patient's strength was normal in all extremities and pronator drift was absent.  Bulk was normal and fasciculations were absent.   Motor Tone - Muscle tone was assessed at the neck and appendages and was normal.  Reflexes - The patient's reflexes were 1+ in all extremities and she had no pathological reflexes.  Sensory - Light touch, temperature/pinprick, vibration and proprioception, and Romberg testing were assessed and were symmetrical.    Coordination - The patient had normal movements in the hands and feet with no ataxia or dysmetria.  Tremor was absent.  Gait and Station - The patient's transfers, posture, gait, station, and turns were observed as normal.   ASSESSMENT/PLAN [name] is a 83 y.o. female with history of chronic A. fib on Eliquis, HTN, breast cancer status post lumpectomy on Arimidex, depression and prior right stroke in 2014 presenting with sore throat and slurred speech. She did not receive IV t-PA.   Transient visual disturbance and questionable dysarthria - visual disturbance more resembles wavy line and pt thinks the dysarthria is due to sore throat.   MRI  no acute stroke  MRA not done as it will not change managment  Carotid Doppler  no significant stenosis  2D Echo  EF 45-50%, no source of embolus. No PFO  LDL 106  HgbA1c pending  Eliquis for VTE prophylaxis  Diet Heart Room service appropriate? Yes; Fluid consistency: Thin  Diet - low sodium heart healthy  Eliquis (apixaban) daily prior to admission, now on Eliquis (apixaban) daily.  Patient counseled to be compliant with her antithrombotic medications  Ongoing aggressive  stroke risk factor management  Therapy recommendations:  No PT, no SLP, no OT  Disposition:  Return home  Atrial Fibrillation  Home anticoagulation:  Eliquis (apixaban) daily continued in the hospital  Compliance with medication   Hypertension  Stable  Hyperlipidemia  Home meds:  No statin  LDL 106, goal < 100  Pt declined statin at this time, would like to discuss with PCP  Other Stroke Risk Factors  Advanced age  Hx stroke/TIA  02/2013 - right frontal lobe (operculum) and tiny posterior aspect of the right opercular region infarct. embolic secondary to atrial fibrillation.  Family hx stroke (mother)  Chronic systolic CHF  Other Active Problems  Sore throat - resolved today  Breast cancer ss/p Sx and on arimidex  Depression  Hospital day # 0  Neurology will sign off. Please call with questions. No neuro follow up needed. Thanks for the consult.  Rosalin Hawking, MD PhD Stroke Neurology 08/02/2016 9:38 PM   To contact Stroke Continuity provider, please refer to [email or web address] After hours, contact General Neurology

## 2016-08-02 NOTE — Evaluation (Signed)
Physical Therapy Evaluation Patient Details Name: Natalie Mendoza MRN: [id number] DOB: 01-Jul-1932 Today's Date: 08/02/2016   History of Present Illness  Pt is an 83 y.o. female who presented to the ED with visual disturbance and slurred speech lasting 20 minutes. MRI revealed no acute abnormality but old R frontal and parietal ischemic infarctions (from previous CVA 2014) and mild/moderate chronic small vessel ischemic changes. PMH: Atrial fibrillation, breast cancer, CHF, depression, hearin gimpairment, hypertension, osteoarthritis, osteoporosis, stroke, and vaginal pessary.  Clinical Impression  Pt is moving well, likely close to her baseline with some small balance checks during gait.  She reports a h/o falls and would benefit from some balance training.  I do not believe she will need PT f/u at discharge.   PT to follow acutely for deficits listed below.     Follow Up Recommendations No PT follow up;Supervision - Intermittent    Equipment Recommendations  None recommended by PT    Recommendations for Other Services   NA    Precautions / Restrictions Precautions Precautions: Fall Precaution Comments: pt reports h/o falls Restrictions Weight Bearing Restrictions: No      Mobility  Bed Mobility Overal bed mobility: Independent                Transfers Overall transfer level: Modified independent Equipment used: None             General transfer comment: Increased time required.  Ambulation/Gait Ambulation/Gait assistance: Supervision Ambulation Distance (Feet): 250 Feet Assistive device: None Gait Pattern/deviations: Step-through pattern;Staggering left;Staggering right   Gait velocity interpretation: at or above normal speed for age/gender General Gait Details: mildly staggering gait pattern, especially when multi tasking.   Stairs Stairs: Yes Stairs assistance: Supervision Stair Management: One rail Right;Two rails;No rails;Alternating  pattern;Forwards Number of Stairs: 5 (x2) General stair comments: Pt more confortable using rails when available, but did demonstrate the ability to walk stairs with one LOB (leaning against the railing) when attempting without rails.  Supervision for safety.       Modified Rankin (Stroke Patients Only) Modified Rankin (Stroke Patients Only) Pre-Morbid Rankin Score: No significant disability Modified Rankin: No significant disability     Balance Overall balance assessment: History of Falls;Needs assistance Sitting-balance support: No upper extremity supported Sitting balance-Leahy Scale: Normal     Standing balance support: No upper extremity supported Standing balance-Leahy Scale: Good                   Standardized Balance Assessment Standardized Balance Assessment : Dynamic Gait Index   Dynamic Gait Index Level Surface: Normal Change in Gait Speed: Normal Gait with Horizontal Head Turns: Mild Impairment Gait with Vertical Head Turns: Normal Gait and Pivot Turn: Normal Step Over Obstacle: Mild Impairment Step Around Obstacles: Normal Steps: Mild Impairment Total Score: 21       Pertinent Vitals/Pain Pain Assessment: No/denies pain    Home Living Family/patient expects to be discharged to:: Private residence Living Arrangements: Alone;Other (Comment) (College-aged grandson lives on 3rd floor of her home) Available Help at Discharge: Family;Available PRN/intermittently Type of Home: House Home Access: Stairs to enter Entrance Stairs-Rails:  ("Pull-up bar") Entrance Stairs-Number of Steps: 2 Home Layout: Multi-level;Able to live on main level with bedroom/bathroom Home Equipment: Shower seat - built in (in upstairs shower rather than tub she uses most frequently)      Prior Function Level of Independence: Independent         Comments: Drives. Reports decreased activity tolerance for shopping but able  to compensate.     Hand Dominance   Dominant  Hand: Right    Extremity/Trunk Assessment   Upper Extremity Assessment Upper Extremity Assessment: Defer to OT evaluation    Lower Extremity Assessment Lower Extremity Assessment: Overall WFL for tasks assessed (not specifically tested, but functionally WNL)    Cervical / Trunk Assessment Cervical / Trunk Assessment: Normal  Communication   Communication: No difficulties  Cognition Arousal/Alertness: Awake/alert Behavior During Therapy: WFL for tasks assessed/performed Overall Cognitive Status: Within Functional Limits for tasks assessed                             Assessment/Plan    PT Assessment Patient needs continued PT services  PT Problem List Decreased balance;Decreased mobility          PT Treatment Interventions DME instruction;Gait training;Stair training;Functional mobility training;Therapeutic activities;Therapeutic exercise;Balance training;Patient/family education    PT Goals (Current goals can be found in the Care Plan section)  Acute Rehab PT Goals Patient Stated Goal: To go home and get back to life. PT Goal Formulation: With patient Time For Goal Achievement: 08/16/16 Potential to Achieve Goals: Good    Frequency Min 3X/week           End of Session   Activity Tolerance: Patient tolerated treatment well Patient left: in chair;with call bell/phone within reach;with chair alarm set      Functional Assessment Tool Used: assist level Functional Limitation: Mobility: Walking and moving around Mobility: Walking and Moving Around Current Status [id number]): At least 1 percent but less than 20 percent impaired, limited or restricted Mobility: Walking and Moving Around Goal Status [phone]): 0 percent impaired, limited or restricted    Time: 1103-1120 PT Time Calculation (min) (ACUTE ONLY): 17 min   Charges:   PT Evaluation $PT Eval Moderate Complexity: 1 Procedure     PT G Codes:   PT G-Codes **NOT FOR INPATIENT CLASS** Functional  Assessment Tool Used: assist level Functional Limitation: Mobility: Walking and moving around Mobility: Walking and Moving Around Current Status [id number]): At least 1 percent but less than 20 percent impaired, limited or restricted Mobility: Walking and Moving Around Goal Status [phone]): 0 percent impaired, limited or restricted    Jamaal Bernasconi B. [place], [place], DPT [phone]   08/02/2016, 2:05 PM

## 2016-08-02 NOTE — Discharge Instructions (Signed)

## 2016-08-02 NOTE — Progress Notes (Signed)
Patient is discharged from room 5M07 at this time. Alert and in stable condition. IV site d/c'd as well as tele. Instructions read to patient and family with understanding verbalized. Left unit via wheelchair with all belongings and son at side.

## 2016-08-02 NOTE — Evaluation (Signed)
Occupational Therapy Evaluation Patient Details Name: Natalie Mendoza MRN: [id number] DOB: 10-31-1932 Today's Date: 08/02/2016    History of Present Illness Pt is an 83 y.o. female who presented to the ED with visual disturbance and slurred speech lasting 20 minutes. MRI revealed no acute abnormality but old R frontal and parietal ischemic infarctions (from previous CVA 2014) and mild/moderate chronic small vessel ischemic changes. PMH: Atrial fibrillation, breast cancer, CHF, depression, hearin gimpairment, hypertension, osteoarthritis, osteoporosis, stroke, and vaginal pessary.   Clinical Impression   PTA, pt was independent with ADL and functional mobility. Pt's college-aged grandson lives in her home and she will have intermittent assistance from her son who lives nearby. Pt currently performing all ADL at modified independent level which is her baseline. Educated pt and daughter on energy conservation techniques and safety post-acute D/C and they report and demonstrate understanding. No further acute OT needs identified and no OT follow-up recommended. Acute OT will sign off.     Follow Up Recommendations  No OT follow up    Equipment Recommendations  None recommended by OT    Recommendations for Other Services       Precautions / Restrictions Restrictions Weight Bearing Restrictions: No      Mobility Bed Mobility Overal bed mobility: Independent                Transfers Overall transfer level: Modified independent Equipment used: None             General transfer comment: Increased time required.    Balance Overall balance assessment: No apparent balance deficits (not formally assessed)                                          ADL Overall ADL's : At baseline;Modified independent                                       General ADL Comments: Increased time but completing ADL at baseline level. Daughter present to confirm.      Vision Vision Assessment?: No apparent visual deficits Additional Comments: Able to functionally use central and peripheral vision during all ADL tasks.   Perception     Praxis Praxis Praxis tested?: Within functional limits    Pertinent Vitals/Pain Pain Assessment: No/denies pain     Hand Dominance Right   Extremity/Trunk Assessment Upper Extremity Assessment Upper Extremity Assessment: Overall WFL for tasks assessed   Lower Extremity Assessment Lower Extremity Assessment: Overall WFL for tasks assessed       Communication Communication Communication: No difficulties   Cognition Arousal/Alertness: Awake/alert Behavior During Therapy: WFL for tasks assessed/performed Overall Cognitive Status: Within Functional Limits for tasks assessed                     General Comments       Exercises       Shoulder Instructions      Home Living Family/patient expects to be discharged to:: Private residence Living Arrangements: Alone;Other (Comment) (College-aged grandson lives on 3rd floor of her home) Available Help at Discharge: Family;Available PRN/intermittently Type of Home: House Home Access: Stairs to enter [employer] of Steps: 2 Entrance Stairs-Rails:  ("Pull-up bar") Home Layout: Multi-level;Able to live on main level with bedroom/bathroom     Bathroom Shower/Tub: Tub/shower unit;Walk-in  shower (tub on main level) Shower/tub characteristics: Curtain Biochemist, clinical: Standard Bathroom Accessibility: Yes   Home Equipment: Shower seat - built in (in upstairs shower rather than tub she uses most frequently)          Prior Functioning/Environment Level of Independence: Independent        Comments: Drives. Reports decreased activity tolerance for shopping but able to compensate.        OT Problem List: Decreased strength;Decreased activity tolerance;Decreased safety awareness   OT Treatment/Interventions:      OT  Goals(Current goals can be found in the care plan section) Acute Rehab OT Goals Patient Stated Goal: To go home and get back to life. OT Goal Formulation: With patient/family Time For Goal Achievement: 08/09/16 Potential to Achieve Goals: Good  OT Frequency:     Barriers to D/C:            Co-evaluation              End of Session    Activity Tolerance: Patient tolerated treatment well Patient left: in bed;with call bell/phone within reach;with family/visitor present   Time: 0823-0850 OT Time Calculation (min): 27 min Charges:  OT General Charges $OT Visit: 1 Procedure OT Evaluation $OT Eval Moderate Complexity: 1 Procedure OT Treatments $Self Care/Home Management : 8-22 mins G-Codes: OT G-codes **NOT FOR INPATIENT CLASS** Functional Assessment Tool Used: clinical judgement Functional Limitation: Self care Self Care Current Status [id number]): 0 percent impaired, limited or restricted Self Care Goal Status [id number]): 0 percent impaired, limited or restricted Self Care Discharge Status [id number]): 0 percent impaired, limited or restricted  [facility], OTR/L [phone] 08/02/2016, 9:16 AM

## 2016-08-02 NOTE — Care Management Note (Signed)
Case Management Note  Patient Details  Name: Natalie Mendoza MRN: [id number] Date of Birth: 1933/05/10  Subjective/Objective:                    Action/Plan: Pt discharged home with self care. No f/u per PT/OT and no DME needs. Pt with insurance and PCP. No further needs per CM.   Expected Discharge Date:  08/02/16               Expected Discharge Plan:  Home/Self Care  In-House Referral:     Discharge planning Services     Post Acute Care Choice:    Choice offered to:     DME Arranged:    DME Agency:     HH Arranged:    HH Agency:     Status of Service:  Completed, signed off  If discussed at [employer] of Stay Meetings, dates discussed:    Additional Comments:  Pollie Friar, RN 08/02/2016, 7:50 PM

## 2016-08-02 NOTE — Care Management Note (Signed)
Case Management Note  Patient Details  Name: Natalie Mendoza MRN: [id number] Date of Birth: 1932-09-13  Subjective/Objective:           Patient presented with Sore throat and slurred speech.  Lives at home independently, but has her college-aged grandson living in her home.  Patient is discharging home today. No CM needs identified at this time.   Action/Plan:   Expected Discharge Date:  08/02/16               Expected Discharge Plan:     In-House Referral:     Discharge planning Services     Post Acute Care Choice:    Choice offered to:     DME Arranged:    DME Agency:     HH Arranged:    HH Agency:     Status of Service:     If discussed at [employer] of [employer], dates discussed:    Additional Comments:  Rolm Baptise, RN 08/02/2016, 3:45 PM

## 2016-08-02 NOTE — Progress Notes (Signed)
**  Preliminary report by tech**  Carotid artery duplex complete. Findings are consistent with a 1-39 percent stenosis involving the right internal carotid artery and the left internal carotid artery. The vertebral arteries demonstrate antegrade flow.  08/02/16 1:28 PM Natalie Mendoza RVT

## 2016-08-02 NOTE — Evaluation (Signed)
Speech Language Pathology Evaluation Patient Details Name: Natalie Mendoza MRN: [id number] DOB: March 16, 1933 Today's Date: 08/02/2016 Time: [id number] SLP Time Calculation (min) (ACUTE ONLY): 33 min  Problem List:  Patient Active Problem List   Diagnosis Date Noted  . Chronic atrial fibrillation ([place]) 08/01/2016  . Chronic systolic CHF (congestive heart failure) ([place]) 08/01/2016  . Left-sided weakness 03/13/2013  . TIA (transient ischemic attack) 03/13/2013  . HTN (hypertension) 03/13/2013  . Dyslipidemia 03/13/2013  . Depression 03/13/2013  . Breast cancer ([place]) 09/01/2012  . Deafness, sensorineural 07/31/2012  . Anxiety 03/17/2011  . Arthropathia 11/28/2007   Past Medical History:  Past Medical History:  Diagnosis Date  . Arrhythmia    Atrial Fib  . Atrial fibrillation ([place])   . Breast cancer ([place])   . CHF (congestive heart failure) ([place])   . Chronic systolic CHF (congestive heart failure) ([place])   . Depression   . Hearing impairment   . Hx of joint problems   . Hypertension   . Osteoarthritis   . Osteoporosis   . Pelvic relaxation   . Stroke [facility])    fully resolved TIA  . Vaginal pessary present    Past Surgical History:  Past Surgical History:  Procedure Laterality Date  . BREAST LUMPECTOMY    . CATARACT EXTRACTION W/ INTRAOCULAR LENS  IMPLANT, BILATERAL Bilateral    HPI:  Pt is an 83 y.o. female who presented to the ED with visual disturbance and slurred speech lasting 20 minutes. MRI revealed no acute abnormality but old R frontal and parietal ischemic infarctions (from previous CVA 2014) and mild/moderate chronic small vessel ischemic changes. MoCA completed in 2014 with score of 21/30. PMH: Atrial fibrillation, breast cancer, CHF, depression, hearin gimpairment, hypertension, osteoarthritis, osteoporosis, stroke, and vaginal pessary.   Assessment / Plan / Recommendation Clinical Impression  Pt scored a 22/30 on the MoCA, which is similar to testing in 2014  when she scored 21/30. She had difficulties with word-finding, working memory, delayed recall, and complex comprehension, which she and her daughter both report is her baseline since CVA in 2014. No further acute f/u recommended at this time. Would continue with intermittent supervision at home upon d/c.    SLP Assessment  Patient does not need any further Speech Lanaguage Pathology Services    Follow Up Recommendations  Other (comment) (intermittent supervision)    Frequency and Duration           SLP Evaluation Cognition  Overall Cognitive Status: Within Functional Limits for tasks assessed       Comprehension  Auditory Comprehension Overall Auditory Comprehension: Impaired at baseline Visual Recognition/Discrimination Discrimination: Within Function Limits    Expression Expression Primary Mode of Expression: Verbal Verbal Expression Overall Verbal Expression: Impaired at baseline Written Expression Dominant Hand: Right   Oral / Motor  Motor Speech Overall Motor Speech: Appears within functional limits for tasks assessed   GO          Functional Assessment Tool Used: skilled clinical judgment Functional Limitations: Spoken language comprehension Spoken Language Comprehension Current Status [id number]): At least 20 percent but less than 40 percent impaired, limited or restricted Spoken Language Comprehension Goal Status [phone]): At least 20 percent but less than 40 percent impaired, limited or restricted Spoken Language Comprehension Discharge Status [phone]): At least 20 percent but less than 40 percent impaired, limited or restricted         Natalie Mendoza 08/02/2016, 2:04 PM  Natalie Mendoza, M.A. CCC-SLP [phone]

## 2016-08-02 NOTE — Discharge Summary (Signed)
Physician Discharge Summary  Natalie Mendoza [id number] DOB: 1933/04/08 DOA: 08/01/2016  PCP: Cari Caraway, MD  Admit date: 08/01/2016 Discharge date: 08/02/2016   Recommendations for Outpatient Follow-Up:   1. Urine culture pending (if ever done)   Discharge Diagnosis:   Principal Problem:   TIA (transient ischemic attack) Active Problems:   Breast cancer (HCC)   HTN (hypertension)   Depression   Chronic atrial fibrillation (HCC)   Chronic systolic CHF (congestive heart failure) [facility])   Discharge disposition:  Home  Discharge Condition: Improved.  Diet recommendation: Low sodium, heart healthy  Wound care: None.   History of Present Illness:   Natalie Mendoza is an 83 y.o. female with a PMH of atrial fibrillation on chronic Eliquis (failed cardioversion attempts 3), hypertension, depression, breast cancer status post lumpectomy on chronic Arimidex, and prior right sided stroke in 2014. She had a full stroke workup at that time including carotid Dopplers which showed 1-39 percent stenosis bilaterally, a 2-D echocardiogram which showed an EF of 45-50 percent with septal and inferobasal hypokinesis.Patient also had a repeat 2-D echo done at [facility] on 07/12/16 which showed an EF of 54% and no appreciable changes from baseline 2-D echo. Patient states that she developed a sore throat for which she went to an urgent care center and had a rapid strep test done. She was talking to her daughter on the telephone when her daughter noticed that her speech sounded slurred. The patient denies any associated symptoms such as dysphagia, diplopia, loss of power or other focal neurological deficits. Slurred speech lasted approximately 20 minutes. No aggravating or alleviating factors. With regard to her sore throat, she has had this symptom for approximately 24 hours but no associated fever, chills, or myalgias. No known sick contacts.  ED Course:  The patient underwent an MRI of her  brain which showed no acute intracranial process, but did show old old right frontal and parietal/MCA territory infarcts and mild to moderate chronic small vessel ischemic disease. 12-lead EKG showed atrial fibrillation. Neurology was consulted and the patient was referred for observation stay.   Hospital Course by Problem:   UTI-suspect mild dehydration from this -IVF in ER -increasing frequency at home -urine culture ordered and pending -IV rocephin x 1 and then ceftin for 5 days -seen by PT/OT- no further follow up Sore throat -flu negative -resolved  ?TIA- doubted based on story -echo done recently -MRI negative -on xarelto- no need for ASA per neuro  Depression Continue Cymbalta  Breast cancer (HCC) Continue Arimidex.  Medical Consultants:   Neuro  Discharge Exam:   Vitals:   08/02/16 0600 08/02/16 0800  BP: 128/87 106/72  Pulse: 64 (!) 57  Resp: 16 18  Temp: 97.9 F (36.6 C) 98.2 F (36.8 C)   Vitals:   08/02/16 0200 08/02/16 0400 08/02/16 0600 08/02/16 0800  BP: 123/80 124/80 128/87 106/72  Pulse: 65 60 64 (!) 57  Resp: 14 14 16 18   Temp: 98.2 F (36.8 C)  97.9 F (36.6 C) 98.2 F (36.8 C)  TempSrc: Oral  Oral Oral  SpO2: 95% 92% 95% 94%  Weight:      Height:        Gen:  NAD- daughter at bedside    The results of significant diagnostics from this hospitalization (including imaging, microbiology, ancillary and laboratory) are listed below for reference.     Procedures and Diagnostic Studies:   Mr Brain Wo Contrast  Result Date: 08/01/2016 CLINICAL DATA:  20  minute episode of visual disturbances and slurred speech. History of hypertension, atrial fibrillation, breast cancer, stroke. EXAM: MRI HEAD WITHOUT CONTRAST TECHNIQUE: Multiplanar, multiecho pulse sequences of the brain and surrounding structures were obtained without intravenous contrast. COMPARISON:  CT HEAD January 06, 2016 and MRI head March 14, 2013 FINDINGS: Multiple sequences are  moderately motion degraded. BRAIN: No reduced diffusion to suggest acute ischemia. No susceptibility artifact to suggest hemorrhage. The ventricles and sulci are normal for patient's age. Old small RIGHT frontal/ insular infarct. Old small RIGHT parietal infarct. Patchy supratentorial and pontine white matter FLAIR T2 hyperintensities. No suspicious parenchymal signal, masses or mass effect. No abnormal extra-axial fluid collections. VASCULAR: Normal major intracranial vascular flow voids present at skull base. SKULL AND UPPER CERVICAL SPINE: No abnormal sellar expansion. No suspicious calvarial bone marrow signal. Craniocervical junction maintained. SINUSES/ORBITS: Trace paranasal sinus and LEFT mastoid effusions. Status post bilateral ocular lens implants. The included ocular globes and orbital contents are non-suspicious. OTHER: None. IMPRESSION: No acute intracranial process on this motion degraded examination. Old RIGHT frontal and parietal/MCA territory infarcts. Mild to moderate chronic small vessel ischemic disease. Electronically Signed   By: Elon Alas M.D.   On: 08/01/2016 17:31     Labs:   Basic Metabolic Panel:  Recent Labs Lab 08/01/16 1409  NA 138  K 4.4  CL 103  CO2 25  GLUCOSE 116*  BUN 23*  CREATININE 0.78  CALCIUM 9.7   GFR Estimated Creatinine Clearance: 56.9 mL/min (by C-G formula based on SCr of 0.78 mg/dL). Liver Function Tests:  Recent Labs Lab 08/01/16 1409  AST 23  ALT 21  ALKPHOS 45  BILITOT 1.3*  PROT 6.6  ALBUMIN 3.8   No results for input(s): LIPASE, AMYLASE in the last 168 hours. No results for input(s): AMMONIA in the last 168 hours. Coagulation profile No results for input(s): INR, PROTIME in the last 168 hours.  CBC:  Recent Labs Lab 08/01/16 1409  WBC 6.8  NEUTROABS 3.7  HGB 15.8*  HCT 46.4*  MCV 94.3  PLT 191   Cardiac Enzymes: No results for input(s): CKTOTAL, CKMB, CKMBINDEX, TROPONINI in the last 168  hours. BNP: Invalid input(s): POCBNP CBG: No results for input(s): GLUCAP in the last 168 hours. D-Dimer No results for input(s): DDIMER in the last 72 hours. Hgb A1c No results for input(s): HGBA1C in the last 72 hours. Lipid Profile  Recent Labs  08/02/16 0411  CHOL 185  HDL 52  LDLCALC 106*  TRIG 136  CHOLHDL 3.6   Thyroid function studies No results for input(s): TSH, T4TOTAL, T3FREE, THYROIDAB in the last 72 hours.  Invalid input(s): FREET3 Anemia work up No results for input(s): VITAMINB12, FOLATE, FERRITIN, TIBC, IRON, RETICCTPCT in the last 72 hours. Microbiology No results found for this or any previous visit (from the past 240 hour(s)).   Discharge Instructions:   Discharge Instructions    Diet - low sodium heart healthy    Complete by:  As directed    Increase activity slowly    Complete by:  As directed      Allergies as of 08/02/2016   No Known Allergies     Medication List    TAKE these medications   anastrozole 1 MG tablet Commonly known as:  ARIMIDEX Take 1 mg by mouth daily.   apixaban 5 MG Tabs tablet Commonly known as:  ELIQUIS Take 1 tablet (5 mg total) by mouth 2 (two) times daily.   bisoprolol 10 MG tablet Commonly  known as:  ZEBETA Take 10 mg by mouth every evening.   cefUROXime 500 MG tablet Commonly known as:  CEFTIN Take 1 tablet (500 mg total) by mouth 2 (two) times daily with a meal.   diltiazem 60 MG 12 hr capsule Commonly known as:  CARDIZEM SR Take 1 capsule (60 mg total) by mouth every 12 (twelve) hours.   DULoxetine 30 MG capsule Commonly known as:  CYMBALTA Take 30 mg by mouth daily.   furosemide 20 MG tablet Commonly known as:  LASIX Take 20 mg by mouth every other day.   risedronate 35 MG tablet Commonly known as:  ACTONEL Take 1 tablet (35 mg total) by mouth every 7 (seven) days. with water on empty stomach, nothing by mouth or lie down for next 30 minutes. What changed:  when to take this  additional  instructions   spironolactone 25 MG tablet Commonly known as:  ALDACTONE Take 25 mg by mouth daily.   valsartan 160 MG tablet Commonly known as:  DIOVAN Take 160 mg by mouth daily.      Follow-up Information    MCNEILL,WENDY, MD Follow up in 1 week(s).   Specialty:  Family Medicine Contact information: [address] [place] [id number] [phone]            Time coordinating discharge: 33 min  Signed:  [place]   [facility] 08/02/2016, 1:59 PM

## 2016-08-03 LAB — HEMOGLOBIN A1C
Hgb A1c MFr Bld: 6.3 % — ABNORMAL HIGH (ref 4.8–5.6)
Mean Plasma Glucose: 134 mg/dL

## 2016-08-04 LAB — URINE CULTURE

## 2016-12-07 IMAGING — CT CT HEAD W/O CM
1 series · 16 of 30 positions shown, 20 images · non-contrast
Comparison: 08/08/2013

CLINICAL DATA: Fall, left side pain

EXAM:
CT HEAD WITHOUT CONTRAST
TECHNIQUE: Contiguous axial images were obtained from the base of the skull
through the vertex without intravenous contrast.

[Series 2: head 4.8 h37s · axial · 0.44mm/px · z∈[-157,-20]mm · 16 of 32 slices shown, 20 images]
[im 2/32  brain]
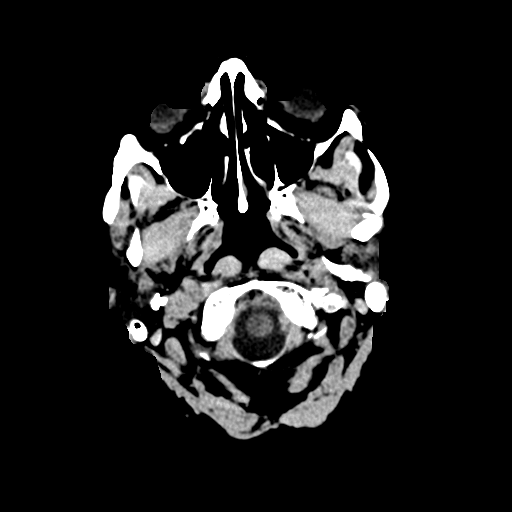
[im 2/32  bone]
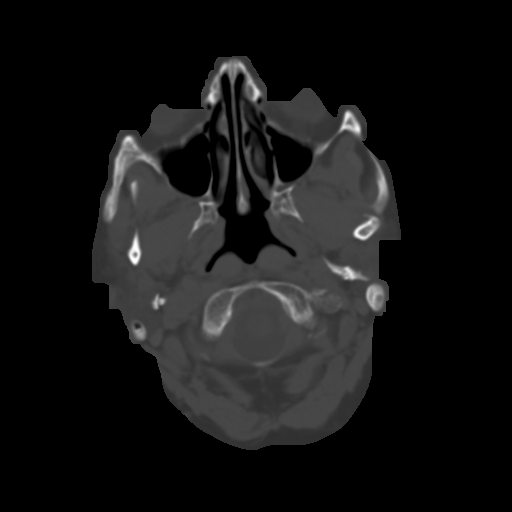
[im 4/32  brain]
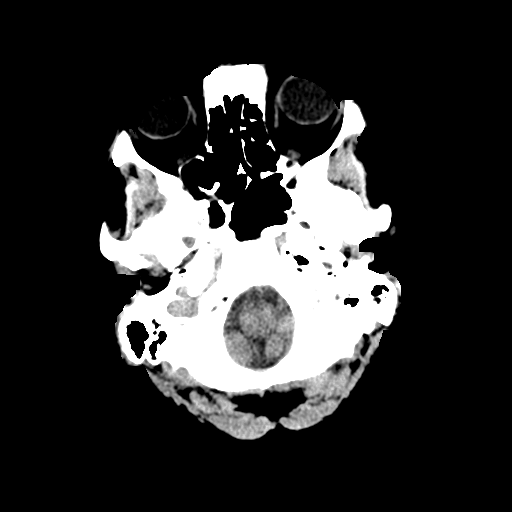
[im 6/32  brain]
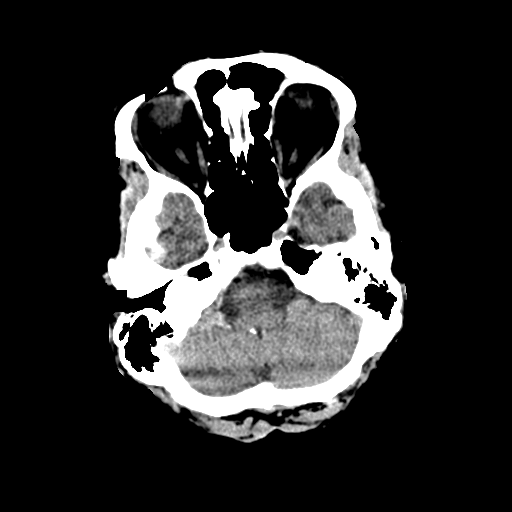
[im 8/32  brain]
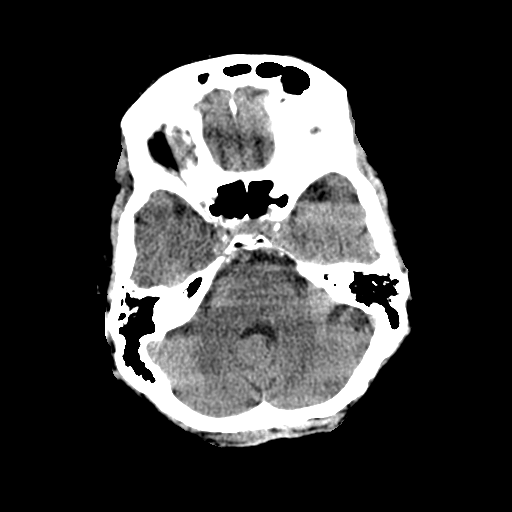
[im 9/32  brain]
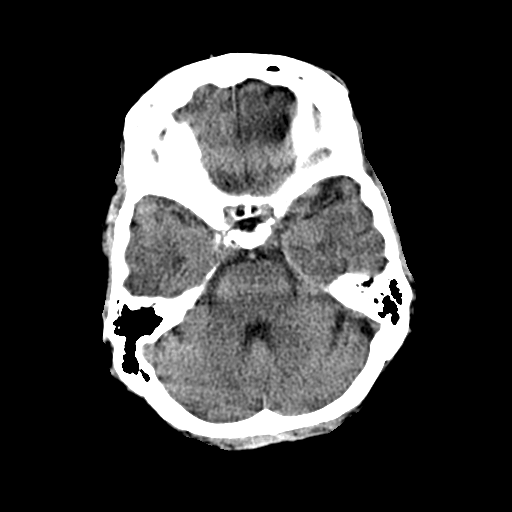
[im 9/32  bone]
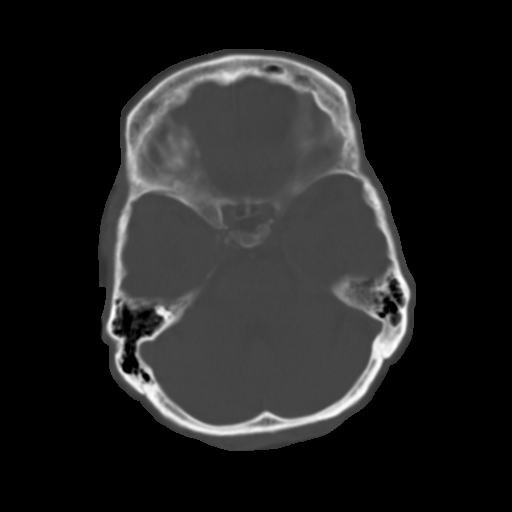
[im 11/32  brain]
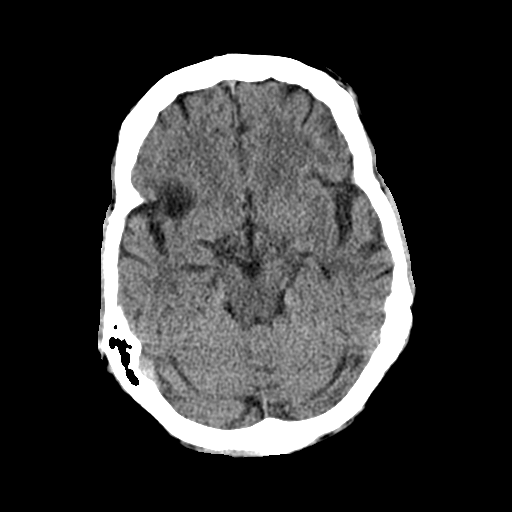
[im 13/32  brain]
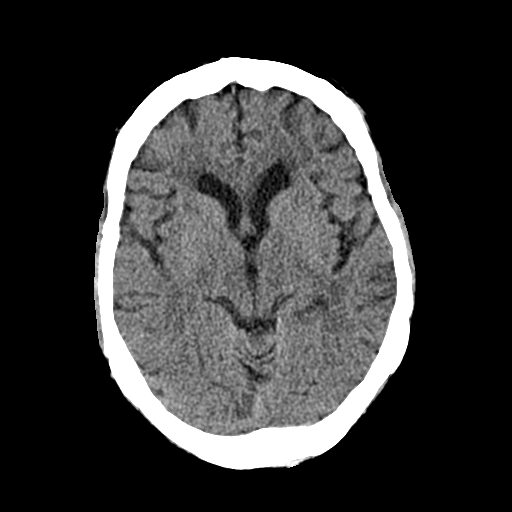
[im 15/32  brain]
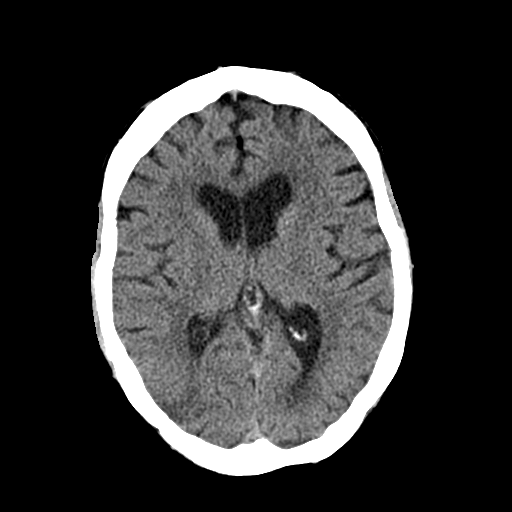
[im 17/32  brain]
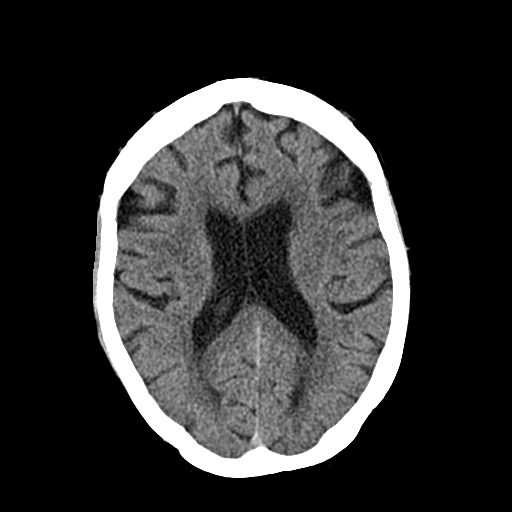
[im 17/32  bone]
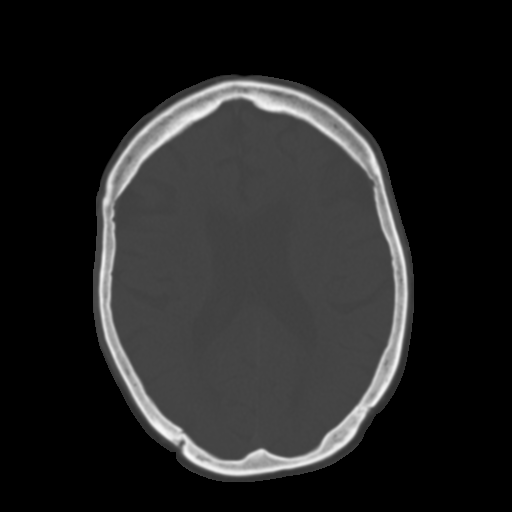
[im 19/32  brain]
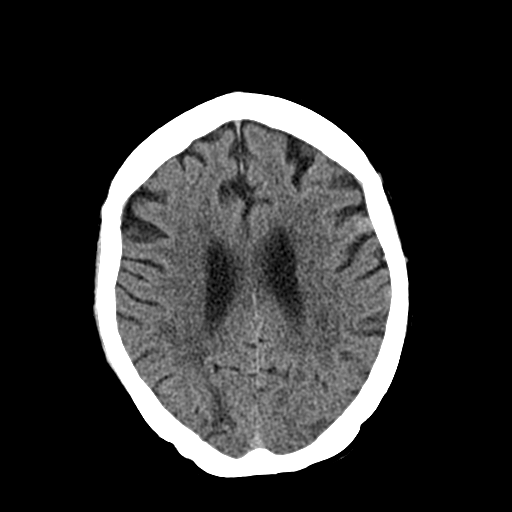
[im 21/32  brain]
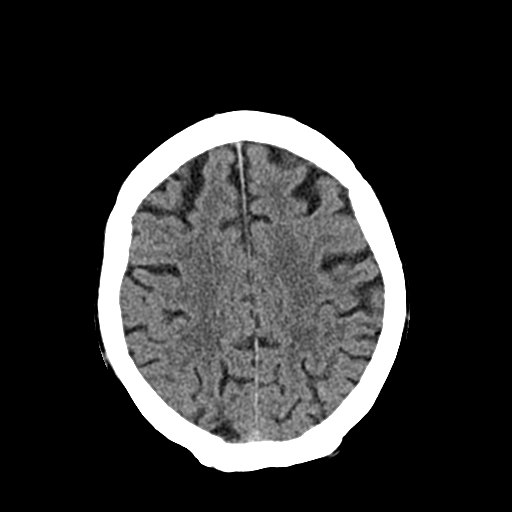
[im 23/32  brain]
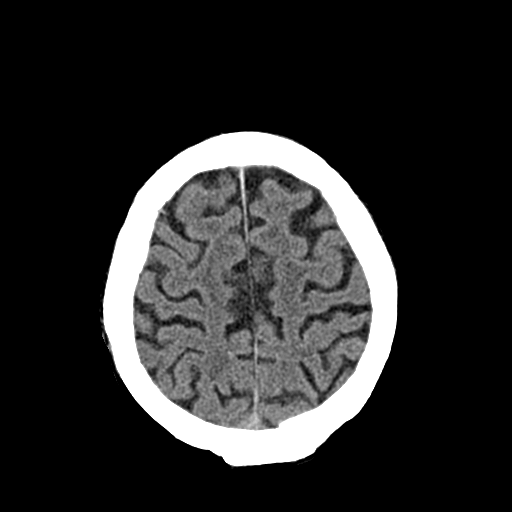
[im 24/32  brain]
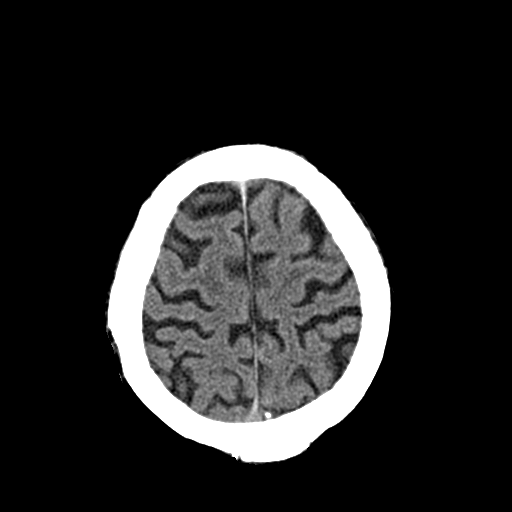
[im 24/32  bone]
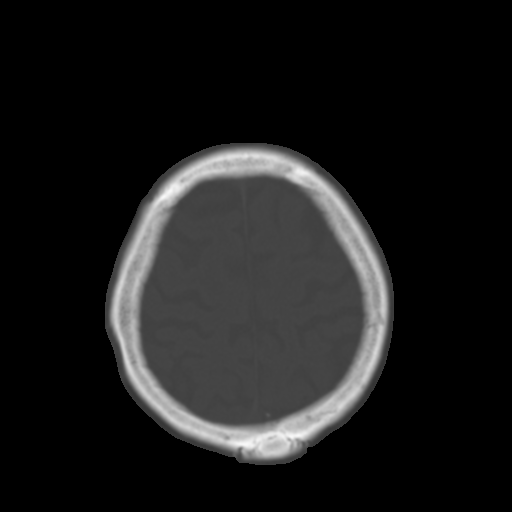
[im 26/32  brain]
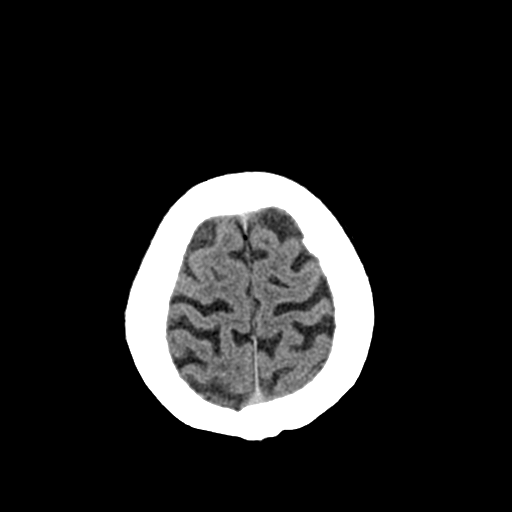
[im 28/32  brain]
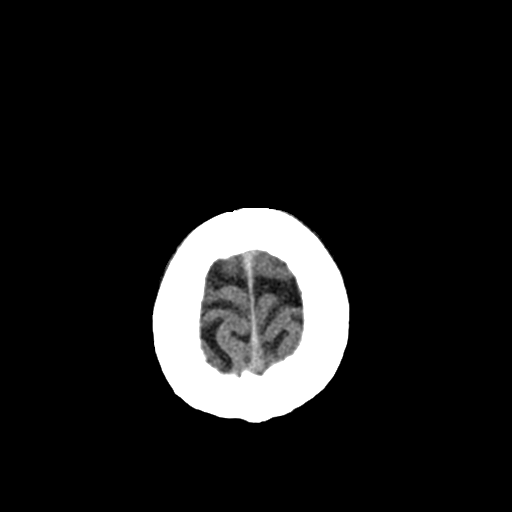
[im 30/32  brain]
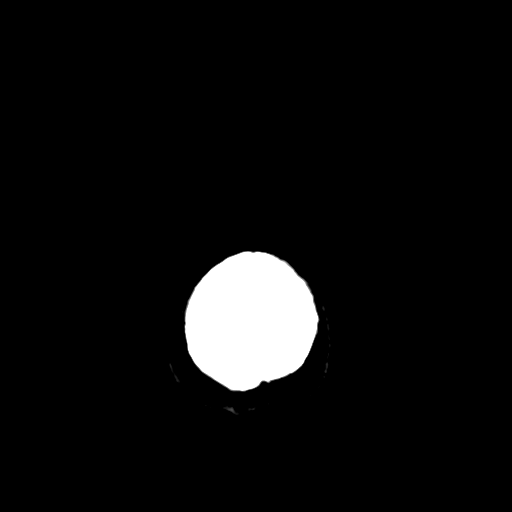

[16 of 30 positions shown; findings below may reference images not displayed]

FINDINGS: No skull fracture is noted. Paranasal sinuses and mastoid air cells
are unremarkable. No intracranial hemorrhage, mass effect or midline
shift. No acute cortical infarction. Stable encephalomalacia in
right frontal lobe. Stable encephalomalacia in parietal/ occipital
right lobe. No acute cortical infarction. No mass lesion is noted on
this unenhanced scan. Mild cerebral atrophy again noted.
IMPRESSION: No acute intracranial abnormality. Stable atrophy. Stable chronic
findings as described above

## 2017-09-18 ENCOUNTER — Emergency Department (HOSPITAL_COMMUNITY)
Admission: EM | Admit: 2017-09-18 | Discharge: 2017-09-18 | Disposition: A | Discharge status: Home / Self Care | Payer: Medicare Other | Attending: Emergency Medicine | Admitting: Emergency Medicine

## 2017-09-18 ENCOUNTER — Emergency Department (HOSPITAL_COMMUNITY): Payer: Medicare Other

## 2017-09-18 DIAGNOSIS — Y939 Activity, unspecified: Secondary | ICD-10-CM | POA: Insufficient documentation

## 2017-09-18 DIAGNOSIS — I5022 Chronic systolic (congestive) heart failure: Secondary | ICD-10-CM | POA: Insufficient documentation

## 2017-09-18 DIAGNOSIS — Y998 Other external cause status: Secondary | ICD-10-CM | POA: Insufficient documentation

## 2017-09-18 DIAGNOSIS — M5412 Radiculopathy, cervical region: Secondary | ICD-10-CM | POA: Diagnosis not present

## 2017-09-18 DIAGNOSIS — M542 Cervicalgia: Secondary | ICD-10-CM | POA: Diagnosis not present

## 2017-09-18 DIAGNOSIS — S4992XA Unspecified injury of left shoulder and upper arm, initial encounter: Secondary | ICD-10-CM | POA: Diagnosis present

## 2017-09-18 DIAGNOSIS — Z79899 Other long term (current) drug therapy: Secondary | ICD-10-CM | POA: Insufficient documentation

## 2017-09-18 DIAGNOSIS — I110 Hypertensive heart disease with heart failure: Secondary | ICD-10-CM | POA: Insufficient documentation

## 2017-09-18 DIAGNOSIS — S46812A Strain of other muscles, fascia and tendons at shoulder and upper arm level, left arm, initial encounter: Secondary | ICD-10-CM | POA: Diagnosis not present

## 2017-09-18 DIAGNOSIS — Z853 Personal history of malignant neoplasm of breast: Secondary | ICD-10-CM | POA: Insufficient documentation

## 2017-09-18 DIAGNOSIS — Z7901 Long term (current) use of anticoagulants: Secondary | ICD-10-CM | POA: Insufficient documentation

## 2017-09-18 DIAGNOSIS — Z8673 Personal history of transient ischemic attack (TIA), and cerebral infarction without residual deficits: Secondary | ICD-10-CM | POA: Insufficient documentation

## 2017-09-18 DIAGNOSIS — Y929 Unspecified place or not applicable: Secondary | ICD-10-CM | POA: Diagnosis not present

## 2017-09-18 DIAGNOSIS — X58XXXA Exposure to other specified factors, initial encounter: Secondary | ICD-10-CM | POA: Diagnosis not present

## 2017-09-18 LAB — BASIC METABOLIC PANEL
Anion gap: 11 (ref 5–15)
BUN: 23 mg/dL — AB (ref 6–20)
CALCIUM: 9.8 mg/dL (ref 8.9–10.3)
CO2: 22 mmol/L (ref 22–32)
CREATININE: 0.82 mg/dL (ref 0.44–1.00)
Chloride: 105 mmol/L (ref 101–111)
GFR calc non Af Amer: >60 mL/min (ref >60)
Glucose, Bld: 98 mg/dL (ref 65–99)
Potassium: 4.8 mmol/L (ref 3.5–5.1)
SODIUM: 138 mmol/L (ref 135–145)

## 2017-09-18 LAB — CBC
HCT: 48.5 % — ABNORMAL HIGH (ref 36.0–46.0)
Hemoglobin: 16.2 g/dL — ABNORMAL HIGH (ref 12.0–15.0)
MCH: 31.8 pg (ref 26.0–34.0)
MCHC: 33.4 g/dL (ref 30.0–36.0)
MCV: 95.3 fL (ref 78.0–100.0)
PLATELETS: 214 10*3/uL (ref 150–400)
RBC: 5.09 MIL/uL (ref 3.87–5.11)
RDW: 13 % (ref 11.5–15.5)
WBC: 7.3 10*3/uL (ref 4.0–10.5)

## 2017-09-18 LAB — I-STAT TROPONIN, ED: TROPONIN I, POC: 0 ng/mL (ref 0.00–0.08)

## 2017-09-18 MED ORDER — HYDROCODONE-ACETAMINOPHEN 5-325 MG PO TABS: 1.0000 | ORAL_TABLET | ORAL | 0 refills | Status: DC | PRN

## 2017-09-18 MED ORDER — HYDROCODONE-ACETAMINOPHEN 5-325 MG PO TABS
1.0000 | ORAL_TABLET | Freq: Once | ORAL | Status: AC
  Administered 2017-09-18: 1 via ORAL
  Filled 2017-09-18: qty 1

## 2017-09-18 MED ORDER — CYCLOBENZAPRINE HCL 5 MG PO TABS: 5.0000 mg | ORAL_TABLET | Freq: Three times a day (TID) | ORAL | 0 refills | Status: DC | PRN

## 2017-09-18 MED ORDER — IBUPROFEN 400 MG PO TABS
400.0000 mg | ORAL_TABLET | Freq: Once | ORAL | Status: AC
  Administered 2017-09-18: 400 mg via ORAL
  Filled 2017-09-18: qty 1

## 2017-09-18 MED ORDER — CYCLOBENZAPRINE HCL 10 MG PO TABS
5.0000 mg | ORAL_TABLET | Freq: Once | ORAL | Status: AC
  Administered 2017-09-18: 5 mg via ORAL
  Filled 2017-09-18: qty 1

## 2017-09-18 NOTE — Discharge Instructions (Signed)
For severe pain take norco or vicodin however realize they have the potential for addiction and it can make you sleepy and has tylenol in it.  No operating machinery while taking. Take Flexeril for muscle spasms and use heat pad as needed. Flexeril can make you sleepy so be careful.  If you were given medicines take as directed.  If you are on coumadin or contraceptives realize their levels and effectiveness is altered by many different medicines.  If you have any reaction (rash, tongues swelling, other) to the medicines stop taking and see a physician.    If your blood pressure was elevated in the ER make sure you follow up for management with a primary doctor or return for chest pain, shortness of breath or stroke symptoms.  Please follow up as directed and return to the ER or see a physician for new or worsening symptoms.  Thank you. Vitals:   09/18/17 1351 09/18/17 1354  BP: 115/75   Pulse: 66   Resp: 16   SpO2: 94%   Weight:  72.6 kg (160 lb)  Height:  5\' 5"  (1.651 m)

## 2017-09-18 NOTE — ED Provider Notes (Signed)
[facility] EMERGENCY DEPARTMENT Provider Note   CSN: [id number] Arrival date & time: 09/18/17  1251     History   Chief Complaint Chief Complaint  Patient presents with  . Shoulder Pain  . Neck Pain    HPI Natalie Mendoza is a 84 y.o. female.  Patient presents with left shoulder and neck pain since yesterday. Fairly constant. Severe this morning and almost 1 to call 911. No direct injuries. Patient does sleep on the couch/with a pillow that may be involved. Patient is not lifting any heavy objects or fallen. Pain was improved with ice. Pain does worsen with specific pressure areas.No weakness or numbness. No headaches or neurologic symptoms.     Past Medical History:  Diagnosis Date  . Arrhythmia    Atrial Fib  . Atrial fibrillation ([place])   . Breast cancer ([place])   . CHF (congestive heart failure) ([place])   . Chronic systolic CHF (congestive heart failure) ([place])   . Depression   . Hearing impairment   . Hx of joint problems   . Hypertension   . Osteoarthritis   . Osteoporosis   . Pelvic relaxation   . Stroke [facility])    fully resolved TIA  . Vaginal pessary present     Patient Active Problem List   Diagnosis Date Noted  . Hyperlipidemia   . Sore throat   . Chronic atrial fibrillation ([place]) 08/01/2016  . Chronic systolic CHF (congestive heart failure) ([place]) 08/01/2016  . Left-sided weakness 03/13/2013  . TIA (transient ischemic attack) 03/13/2013  . HTN (hypertension) 03/13/2013  . Dyslipidemia 03/13/2013  . Depression 03/13/2013  . Breast cancer ([place]) 09/01/2012  . Deafness, sensorineural 07/31/2012  . Anxiety 03/17/2011  . Arthropathia 11/28/2007    Past Surgical History:  Procedure Laterality Date  . BREAST LUMPECTOMY    . CATARACT EXTRACTION W/ INTRAOCULAR LENS  IMPLANT, BILATERAL Bilateral      OB History    Gravida  2   Para  2   Term      Preterm      AB      Living        SAB      TAB      Ectopic      Multiple      Live Births               Home Medications    Prior to Admission medications   Medication Sig Start Date End Date Taking? Authorizing Provider  anastrozole (ARIMIDEX) 1 MG tablet Take 1 mg by mouth daily.    [provider]  apixaban (ELIQUIS) 5 MG TABS tablet Take 1 tablet (5 mg total) by mouth 2 (two) times daily. 03/15/13   Monika Salk, MD  bisoprolol (ZEBETA) 10 MG tablet Take 10 mg by mouth every evening.  12/05/14 08/01/16  [provider]  cefUROXime (CEFTIN) 500 MG tablet Take 1 tablet (500 mg total) by mouth 2 (two) times daily with a meal. 08/02/16   Geradine Girt, DO  cyclobenzaprine (FLEXERIL) 5 MG tablet Take 1 tablet (5 mg total) by mouth 3 (three) times daily as needed for muscle spasms. 09/18/17   Elnora Morrison, MD  diltiazem (CARDIZEM SR) 60 MG 12 hr capsule Take 1 capsule (60 mg total) by mouth every 12 (twelve) hours. 03/15/13   Monika Salk, MD  DULoxetine (CYMBALTA) 30 MG capsule Take 30 mg by mouth daily.    [provider]  furosemide (LASIX) 20 MG tablet Take 20 mg by mouth every other day.  12/11/14 08/01/16  [provider]  HYDROcodone-acetaminophen (NORCO) 5-325 MG tablet Take 1-2 tablets by mouth every 4 (four) hours as needed. 09/18/17   Elnora Morrison, MD  risedronate (ACTONEL) 35 MG tablet Take 1 tablet (35 mg total) by mouth every 7 (seven) days. with water on empty stomach, nothing by mouth or lie down for next 30 minutes. Patient taking differently: Take 35 mg by mouth every Sunday. with water on empty stomach, nothing by mouth or lie down for next 30 minutes. 09/01/12   Haygood, Seymour Bars, MD  spironolactone (ALDACTONE) 25 MG tablet Take 25 mg by mouth daily.    [provider]  valsartan (DIOVAN) 160 MG tablet Take 160 mg by mouth daily.    [provider]    Family History Family History  Problem Relation Age of Onset  . Hypertension Mother   . Stroke Mother   . Ovarian cancer  Maternal Grandmother     Social History Social History   Tobacco Use  . Smoking status: Never Smoker  . Smokeless tobacco: Never Used  Substance Use Topics  . Alcohol use: No  . Drug use: No     Allergies   Patient has no known allergies.   Review of Systems Review of Systems  Constitutional: Negative for chills and fever.  HENT: Negative for congestion.   Eyes: Negative for visual disturbance.  Respiratory: Negative for shortness of breath.   Cardiovascular: Negative for chest pain.  Gastrointestinal: Negative for abdominal pain and vomiting.  Genitourinary: Negative for dysuria and flank pain.  Musculoskeletal: Positive for neck pain. Negative for back pain and neck stiffness.  Skin: Negative for rash.  Neurological: Negative for weakness, light-headedness and headaches.     Physical Exam Updated Vital Signs BP 115/75 (BP Location: Left Arm)   Pulse 66   Resp 16   Ht 5\' 5"  (1.651 m)   Wt 72.6 kg (160 lb)   SpO2 94%   BMI 26.63 kg/m   Physical Exam  Constitutional: She is oriented to person, place, and time. She appears well-developed and well-nourished.  HENT:  Head: Normocephalic and atraumatic.  Eyes: Conjunctivae are normal. Right eye exhibits no discharge. Left eye exhibits no discharge.  Neck: Normal range of motion. Neck supple. No tracheal deviation present.  Cardiovascular: Normal rate and regular rhythm.  Pulmonary/Chest: Effort normal and breath sounds normal.  Abdominal: Soft. She exhibits no distension. There is no tenderness. There is no guarding.  Musculoskeletal: She exhibits no edema.  Patient has tenderness extending from left posterior shoulder along trapezius into base of skull on the left. Patient's goal range of motion of head and neck. Patient has 5+ strength with flexion extension of major joints upper and lower extremities bilateral. Tight musculature left trapezius.  Neurological: She is alert and oriented to person, place, and time.  No cranial nerve deficit.  Skin: Skin is warm. No rash noted.  Psychiatric: She has a normal mood and affect.  Nursing note and vitals reviewed.    ED Treatments / Results  Labs (all labs ordered are listed, but only abnormal results are displayed) Labs Reviewed  BASIC METABOLIC PANEL - Abnormal; Notable for the following components:      Result Value   BUN 23 (*)    All other components within normal limits  CBC - Abnormal; Notable for the following components:   Hemoglobin 16.2 (*)    HCT  48.5 (*)    All other components within normal limits  I-STAT TROPONIN, ED    EKG EKG Interpretation  Date/Time:  Sunday September 18 2017 14:08:43 EDT Ventricular Rate:  64 PR Interval:    QRS Duration: 138 QT Interval:  452 QTC Calculation: 466 R Axis:   -78 Text Interpretation:  Atrial fibrillation with premature ventricular or aberrantly conducted complexes Left axis deviation Non-specific intra-ventricular conduction block Confirmed by Elnora Morrison [phone]) on 09/18/2017 4:42:38 PM   Radiology Dg Chest 2 View  Result Date: 09/18/2017 CLINICAL DATA:  Left shoulder pain radiating to the left neck and posterior head since yesterday. EXAM: CHEST - 2 VIEW COMPARISON:  01/06/2016 FINDINGS: The cardiac silhouette remains mildly enlarged. There is minimal scarring in the lingula. No acute airspace consolidation, edema, pleural effusion, or pneumothorax is identified. Surgical clips project over the right anterior chest wall. No acute osseous abnormality is seen. IMPRESSION: No active cardiopulmonary disease. Electronically Signed   By: Logan Bores M.D.   On: 09/18/2017 14:40   Ct Cervical Spine Wo Contrast  Result Date: 09/18/2017 CLINICAL DATA:  Neck pain radiating to the shoulders EXAM: CT CERVICAL SPINE WITHOUT CONTRAST TECHNIQUE: Multidetector CT imaging of the cervical spine was performed without intravenous contrast. Multiplanar CT image reconstructions were also generated. COMPARISON:   01/06/2016 CT FINDINGS: Alignment: Maintained cervical lordosis. Intact craniocervical relationship. Mild osteoarthritic joint space narrowing of the atlantodental interval. Skull base and vertebrae: No acute fracture. No primary bone lesion or focal pathologic process. Soft tissues and spinal canal: No prevertebral fluid or swelling. No visible canal hematoma. Disc levels: Moderate disc flattening at C5-6 and C6-7 with interval progression of uncinate spurring contributing to mild-to-moderate neural foraminal bony encroachment at C5-6 bilaterally. Mild degenerative facet arthropathy the left at C3-4 and C4-5 as well as C6-7 and C7-T1. Mild right-sided C3-4 and C4-5 facet arthropathy. Upper chest: Negative Other: None IMPRESSION: 1. Moderate disc flattening at C5-6 and C6-7 with some interval progression of uncinate spurring contributing to bilateral mild to moderate foraminal encroachment at C5-6. 2. No acute cervical spine fracture nor suspicious osseous lesions. Electronically Signed   By: Ashley Royalty M.D.   On: 09/18/2017 18:05    Procedures Procedures (including critical care time)  Medications Ordered in ED Medications  HYDROcodone-acetaminophen (NORCO/VICODIN) 5-325 MG per tablet 1 tablet (has no administration in time range)  cyclobenzaprine (FLEXERIL) tablet 5 mg (has no administration in time range)  ibuprofen (ADVIL,MOTRIN) tablet 400 mg (has no administration in time range)     Initial Impression / Assessment and Plan / ED Course  I have reviewed the triage vital signs and the nursing notes.  Pertinent labs & imaging results that were available during my care of the patient were reviewed by me and considered in my medical decision making (see chart for details).    Patient presents with clinically musculoskeletal pathology of trapezius on the left. Patient has never had this before no definitive injury. Discussed screening CT of the neck to look for any signs of compression  fracture/arthritis. No concern for dissection at this time as pain is mostly reproducible and starts at the shoulder and comes up into the neck. Neurologically doing well. Discussed outpatient follow-up. Patient had screening blood work for possible atypical cardiac which is reassuring. Troponin negative. CT scan results reviewed showing arthritis/spurring, disc flattening and concern for radicular. Plan for close follow-up with primary doctor and orthostatic neurosurgery as needed outpatient. Results and differential diagnosis were discussed with the patient/parent/guardian.  Xrays were independently reviewed by myself.  Close follow up outpatient was discussed, comfortable with the plan.   Medications  HYDROcodone-acetaminophen (NORCO/VICODIN) 5-325 MG per tablet 1 tablet (has no administration in time range)  cyclobenzaprine (FLEXERIL) tablet 5 mg (has no administration in time range)  ibuprofen (ADVIL,MOTRIN) tablet 400 mg (has no administration in time range)    Vitals:   09/18/17 1351 09/18/17 1354  BP: 115/75   Pulse: 66   Resp: 16   SpO2: 94%   Weight:  72.6 kg (160 lb)  Height:  5\' 5"  (1.651 m)    Final diagnoses:  Trapezius strain, left, initial encounter  Neck pain, acute  Cervical radiculopathy at C6     Final Clinical Impressions(s) / ED Diagnoses   Final diagnoses:  Trapezius strain, left, initial encounter  Neck pain, acute  Cervical radiculopathy at C6    ED Discharge Orders        Ordered    HYDROcodone-acetaminophen (NORCO) 5-325 MG tablet  Every 4 hours PRN     09/18/17 1841    cyclobenzaprine (FLEXERIL) 5 MG tablet  3 times daily PRN     09/18/17 1841       Elnora Morrison, MD 09/18/17 1843

## 2017-09-18 NOTE — ED Triage Notes (Signed)
Pt to ED c/o L shoulder pain that radiates to L side of neck and posterior head since yesterday. Pt states the pain worsened severely this morning to where she almost called 911. Patient denies any known injury or lifting heavy objects. Pain is relieved by ice. Denies worsened pain with movement.

## 2017-12-15 DIAGNOSIS — R0602 Shortness of breath: Secondary | ICD-10-CM | POA: Insufficient documentation

## 2019-03-06 DIAGNOSIS — G8918 Other acute postprocedural pain: Secondary | ICD-10-CM | POA: Insufficient documentation

## 2019-03-22 DIAGNOSIS — Z96652 Presence of left artificial knee joint: Secondary | ICD-10-CM | POA: Insufficient documentation

## 2019-03-26 ENCOUNTER — Encounter: Payer: Self-pay | Admitting: Physical Therapy

## 2019-03-26 ENCOUNTER — Other Ambulatory Visit: Payer: Self-pay

## 2019-03-26 ENCOUNTER — Ambulatory Visit: Payer: Medicare Other | Attending: Orthopedic Surgery | Admitting: Physical Therapy

## 2019-03-26 DIAGNOSIS — M6281 Muscle weakness (generalized): Secondary | ICD-10-CM | POA: Diagnosis present

## 2019-03-26 DIAGNOSIS — M25662 Stiffness of left knee, not elsewhere classified: Secondary | ICD-10-CM | POA: Insufficient documentation

## 2019-03-26 DIAGNOSIS — M25562 Pain in left knee: Secondary | ICD-10-CM | POA: Diagnosis present

## 2019-03-26 NOTE — Patient Instructions (Signed)
Access Code: [email or web address]  URL: [email or web address]  Date: 03/26/2019  Prepared by: Jari Favre   Exercises  Sidelying Hip Abduction - 10 reps - 1 sets - 3x daily - 7x weekly

## 2019-03-26 NOTE — Therapy (Signed)
[facility] [address], [place], [place], [address] Phone: [phone]   Fax:  [phone]  Physical Therapy Evaluation  Patient Details  Name: Natalie Mendoza MRN: [id number] Date of Birth: 09/10/1932 Referring Provider (PT): Mardi Mainland, MD   Encounter Date: 03/26/2019  PT End of Session - 03/26/19 1138    Visit Number  1    Date for PT Re-Evaluation  05/21/19    PT Start Time  1100    PT Stop Time  1150    PT Time Calculation (min)  50 min    Activity Tolerance  Patient tolerated treatment well    Behavior During Therapy  [facility] for tasks assessed/performed       Past Medical History:  Diagnosis Date  . Arrhythmia    Atrial Fib  . Atrial fibrillation ([place])   . Breast cancer ([place])   . CHF (congestive heart failure) ([place])   . Chronic systolic CHF (congestive heart failure) ([place])   . Depression   . Hearing impairment   . Hx of joint problems   . Hypertension   . Osteoarthritis   . Osteoporosis   . Pelvic relaxation   . Stroke [facility])    fully resolved TIA  . Vaginal pessary present     Past Surgical History:  Procedure Laterality Date  . BREAST LUMPECTOMY    . CATARACT EXTRACTION W/ INTRAOCULAR LENS  IMPLANT, BILATERAL Bilateral     There were no vitals filed for this visit.   Subjective Assessment - 03/26/19 1106    Subjective  Pt was not using the cane until a year ago when knee began hurting. Pt had Lt TKA on 03/06/19 and has had 5 HHPT visits.    Patient is accompained by:  Family member   daughter Lattie Haw   Pertinent History  Lt TKA    Limitations  Walking;House hold activities   getting up stairs   Patient Stated Goals  not use the cane, able to get upstairs to bedroom    Currently in Pain?  Yes    Pain Score  6     Pain Location  Knee    Pain Orientation  Left    Pain Descriptors / Indicators  Aching    Pain Type  Surgical pain    Pain Onset  More than a month ago    Pain  Frequency  Intermittent    Aggravating Factors   exercises    Pain Relieving Factors  ice, pain meds    Effect of Pain on Daily Activities  can't get upstairs    Multiple Pain Sites  No         [facility] PT Assessment - 03/26/19 0001      Assessment   Medical Diagnosis  Z96.652 (ICD-10-CM) - Presence of left artificial knee joint    Referring Provider (PT)  Mardi Mainland, MD    Onset Date/Surgical Date  03/06/19    [facility] Therapy  HHPT      Precautions   Precautions  Knee      Restrictions   Weight Bearing Restrictions  No      Balance Screen   Has the patient fallen in the past 6 months  No      [place] residence    Living Arrangements  Alone      Mendoza Function   Level of Independence  Independent      Cognition  Overall Cognitive Status  Within Functional Limits for tasks assessed      Observation/Other Assessments   Observations  incision healing well, redness around lower 1/3 of incision (family reports from bandage) extending out about 1.5 inches to either side, not tender or painful, will monitor; no sign of oozing or pus    Focus on Therapeutic Outcomes (FOTO)   69% limited      Observation/Other Assessments-Edema    Edema  Circumferential      Circumferential Edema   Circumferential - Right  38    Circumferential - Left   40      Posture/Postural Control   Posture/Postural Control  Postural limitations    Postural Limitations  Rounded Shoulders      ROM / Strength   AROM / PROM / Strength  AROM;Strength      AROM   AROM Assessment Site  Knee    Right/Left Knee  Right;Left    Right Knee Extension  -2    Right Knee Flexion  140    Left Knee Extension  2    Left Knee Flexion  105      Strength   Overall Strength Comments  Lt LE 4+/5; Rt LE 5/5      Flexibility   Soft Tissue Assessment /Muscle Length  yes    Hamstrings  80%      Ambulation/Gait   Assistive device  Straight cane    Gait Pattern   Decreased stance time - left;Decreased stride length      Standardized Balance Assessment   Standardized Balance Assessment  Five Times Sit to Stand    Five times sit to stand comments   19 sec                Objective measurements completed on examination: See above findings.              PT Education - 03/26/19 1137    Education Details  Access Code: [email or web address]    Person(s) Educated  Patient;Caregiver(s)   daughter   Methods  Explanation;Demonstration;Handout;Verbal cues    Comprehension  Verbalized understanding;Returned demonstration       PT Short Term Goals - 03/26/19 1358      PT SHORT TERM GOAL #1   Title  Pt will report she is back to at least 60% of her normal activites around the home    Time  4    Period  Weeks    Status  New    Target Date  04/23/19      PT SHORT TERM GOAL #2   Title  Pt will be ind with initial HEP    Time  4    Period  Weeks    Status  New    Target Date  04/23/19      PT SHORT TERM GOAL #3   Title  Pt will be able to demonstrate Lt knee ext to 0 and flexion to >120 deg for improved function    Time  6    Period  Weeks    Status  New    Target Date  05/07/19        PT Long Term Goals - 03/26/19 1350      PT LONG TERM GOAL #1   Title  Pt will demonstrate 5x sit to stand in < 13 sec.    Baseline  19 sec    Time  8    Period  Weeks    Status  New    Target Date  05/21/19      PT LONG TERM GOAL #2   Title  FOTO< or = to 45% limted    Baseline  69% limited    Time  8    Period  Weeks    Status  New    Target Date  05/21/19      PT LONG TERM GOAL #3   Title  Pt will be able to get up and down stairs to the second floor in her home so she can stay in her bedroom.    Time  8    Period  Weeks    Status  New    Target Date  05/21/19      PT LONG TERM GOAL #4   Title  Pt will report she is able to manage her pain enough so that she can return to walking around the store for at least 30 minutes at a time.     Time  8    Period  Weeks    Status  New    Target Date  05/21/19      PT LONG TERM GOAL #5   Title  Pt will be ind with advanced HEP    Time  8    Period  Weeks    Status  New    Target Date  05/21/19             Plan - 03/26/19 1401    Clinical Impression Statement  Pt presents to skilled PT with her daughter,Lisa, who is her caretaker.  Pt ambulates with straight cane in Rt UE with less stance time on Lt side due to recent Lt TKA on 03/06/19.  She has Lt LE weakness Lt grossly 4+/5 MMT.  Pt demonstrates some decreased balance with 5x sit to stand 19 seconds.  She has increased edema in Lt knee as metioned above.  Pt has decreased knee extension and flexion ROM.  Pt will benefit from skilled PT to address impairments so she can return to maximum independent funcitonal level.    Personal Factors and Comorbidities  Comorbidity 1    Comorbidities  Lt TKA    Stability/Clinical Decision Making  Stable/Uncomplicated    Clinical Decision Making  Low    Rehab Potential  Excellent    PT Frequency  2x / week    PT Duration  8 weeks    PT Treatment/Interventions  ADLs/Self Care Home Management;Aquatic Therapy;Biofeedback;Cryotherapy;Electrical Stimulation;Moist Heat;Neuromuscular re-education;Passive range of motion;Dry needling;Manual techniques;Patient/family education;Therapeutic exercise;Therapeutic activities;Gait training    PT Next Visit Plan  quad strengthening, ROM, bike for ROM, vaso    PT Home Exercise Plan  Access Code: [email or web address] and Agree with Plan of Care  Patient       Patient will benefit from skilled therapeutic intervention in order to improve the following deficits and impairments:  Abnormal gait, Pain, Increased fascial restricitons, Impaired flexibility, Decreased strength, Decreased range of motion, Difficulty walking, Decreased skin integrity, Increased edema  Visit Diagnosis: Acute pain of left knee  Stiffness of left knee, not elsewhere  classified  Muscle weakness (generalized)     Problem List Patient Active Problem List   Diagnosis Date Noted  . Hyperlipidemia   . Sore throat   . Chronic atrial fibrillation ([place]) 08/01/2016  . Chronic systolic CHF (congestive heart failure) ([place]) 08/01/2016  . Left-sided weakness 03/13/2013  . TIA (transient ischemic attack) 03/13/2013  . HTN (hypertension) 03/13/2013  .  Dyslipidemia 03/13/2013  . Depression 03/13/2013  . Breast cancer ([place]) 09/01/2012  . Deafness, sensorineural 07/31/2012  . Anxiety 03/17/2011  . Arthropathia 11/28/2007    Jule Ser, PT 03/26/2019, 5:32 PM  [facility] [address] [place], [place], [address] Phone: [phone]   Fax:  [phone]  Name: Natalie Mendoza MRN: [id number] Date of Birth: March 23, 1933

## 2019-03-28 ENCOUNTER — Other Ambulatory Visit: Payer: Self-pay

## 2019-03-28 ENCOUNTER — Ambulatory Visit: Payer: Medicare Other | Admitting: Physical Therapy

## 2019-03-28 ENCOUNTER — Encounter: Payer: Self-pay | Admitting: Physical Therapy

## 2019-03-28 DIAGNOSIS — M6281 Muscle weakness (generalized): Secondary | ICD-10-CM

## 2019-03-28 DIAGNOSIS — M25662 Stiffness of left knee, not elsewhere classified: Secondary | ICD-10-CM

## 2019-03-28 DIAGNOSIS — M25562 Pain in left knee: Secondary | ICD-10-CM | POA: Diagnosis not present

## 2019-03-28 NOTE — Therapy (Signed)
[facility] [address] [place], [place], [address] Phone: [phone]   Fax:  [phone]  Physical Therapy Treatment  Patient Details  Name: Natalie Mendoza MRN: [id number] Date of Birth: 09-21-1932 Referring Provider (PT): Mardi Mainland, MD   Encounter Date: 03/28/2019  PT End of Session - 03/28/19 1412    Visit Number  2    Date for PT Re-Evaluation  05/21/19    PT Start Time  [id number]    PT Stop Time  1453    PT Time Calculation (min)  50 min    Activity Tolerance  Patient tolerated treatment well    Behavior During Therapy  [facility] for tasks assessed/performed       Past Medical History:  Diagnosis Date  . Arrhythmia    Atrial Fib  . Atrial fibrillation ([place])   . Breast cancer ([place])   . CHF (congestive heart failure) ([place])   . Chronic systolic CHF (congestive heart failure) ([place])   . Depression   . Hearing impairment   . Hx of joint problems   . Hypertension   . Osteoarthritis   . Osteoporosis   . Pelvic relaxation   . Stroke [facility])    fully resolved TIA  . Vaginal pessary present     Past Surgical History:  Procedure Laterality Date  . BREAST LUMPECTOMY    . CATARACT EXTRACTION W/ INTRAOCULAR LENS  IMPLANT, BILATERAL Bilateral     There were no vitals filed for this visit.  Subjective Assessment - 03/28/19 1405    Subjective  I worked out today. I took pain pills before.  It is a 10/10 when bending.    Patient is accompained by:  Family member    Pertinent History  Lt TKA    Limitations  Walking;House hold activities    Patient Stated Goals  not use the cane, able to get upstairs to bedroom    Currently in Pain?  Yes    Pain Score  6     Pain Location  Knee    Pain Descriptors / Indicators  Aching    Pain Type  Surgical pain    Pain Onset  More than a month ago    Pain Frequency  Intermittent    Multiple Pain Sites  No                       [facility] Adult PT  Treatment/Exercise - 03/28/19 0001      Ambulation/Gait   Gait Comments  cues to increase knee flexion with gait      Exercises   Exercises  Knee/Hip      Knee/Hip Exercises: Aerobic   Stationary Bike  ROM x 6 min   able to to do full revolutions     Knee/Hip Exercises: Standing   Forward Step Up  Left;10 reps;Step Height: 2";Hand Hold: 1   cue to bend knee     Knee/Hip Exercises: Seated   Long Arc Quad  Strengthening;Left;20 reps;Weights   2lb, 10 more reps with 5 sec hold   Sit to Sand  20 reps;without UE support   cues to put nose over toes and slowly lower     Modalities   Modalities  Vasopneumatic      Vasopneumatic   Number Minutes Vasopneumatic   15 minutes    Vasopnuematic Location   Knee    Vasopneumatic Pressure  Low    Vasopneumatic Temperature   3  flakes             PT Education - 03/28/19 1446    Education Details  Access Code: [email or web address]    Person(s) Educated  Patient    Methods  Explanation;Demonstration;Verbal cues;Handout;Tactile cues    Comprehension  Verbalized understanding;Returned demonstration       PT Short Term Goals - 03/26/19 1358      PT SHORT TERM GOAL #1   Title  Pt will report she is back to at least 60% of her normal activites around the home    Time  4    Period  Weeks    Status  New    Target Date  04/23/19      PT SHORT TERM GOAL #2   Title  Pt will be ind with initial HEP    Time  4    Period  Weeks    Status  New    Target Date  04/23/19      PT SHORT TERM GOAL #3   Title  Pt will be able to demonstrate Lt knee ext to 0 and flexion to >120 deg for improved function    Time  6    Period  Weeks    Status  New    Target Date  05/07/19        PT Long Term Goals - 03/26/19 1350      PT LONG TERM GOAL #1   Title  Pt will demonstrate 5x sit to stand in < 13 sec.    Baseline  19 sec    Time  8    Period  Weeks    Status  New    Target Date  05/21/19      PT LONG TERM GOAL #2   Title  FOTO< or = to 45%  limted    Baseline  69% limited    Time  8    Period  Weeks    Status  New    Target Date  05/21/19      PT LONG TERM GOAL #3   Title  Pt will be able to get up and down stairs to the second floor in her home so she can stay in her bedroom.    Time  8    Period  Weeks    Status  New    Target Date  05/21/19      PT LONG TERM GOAL #4   Title  Pt will report she is able to manage her pain enough so that she can return to walking around the store for at least 30 minutes at a time.    Time  8    Period  Weeks    Status  New    Target Date  05/21/19      PT LONG TERM GOAL #5   Title  Pt will be ind with advanced HEP    Time  8    Period  Weeks    Status  New    Target Date  05/21/19            Plan - 03/28/19 1413    Clinical Impression Statement  Pt had one treatment since eval.  Pt did well with recumbent bike and was able to do full revolutions after 2 minutes of rocking.  Pt needed cues for increased knee flexion during gait and nose over toes with sit to stand for improved movement patterns.  Pt will benefit from skilled PT  to progress ROM and strength in order to work towards maximum functional activities.    Comorbidities  Lt TKA    PT Treatment/Interventions  ADLs/Self Care Home Management;Aquatic Therapy;Biofeedback;Cryotherapy;Electrical Stimulation;Moist Heat;Neuromuscular re-education;Passive range of motion;Dry needling;Manual techniques;Patient/family education;Therapeutic exercise;Therapeutic activities;Gait training    PT Next Visit Plan  quad strengthening, ROM, bike for ROM, nustep strength, small step ups and add resistance as tolerated, vaso for swelling post treatment    PT Home Exercise Plan  Access Code: [email or web address] and Agree with Plan of Care  Patient       Patient will benefit from skilled therapeutic intervention in order to improve the following deficits and impairments:  Abnormal gait, Pain, Increased fascial restricitons, Impaired  flexibility, Decreased strength, Decreased range of motion, Difficulty walking, Decreased skin integrity, Increased edema  Visit Diagnosis: Acute pain of left knee  Stiffness of left knee, not elsewhere classified  Muscle weakness (generalized)     Problem List Patient Active Problem List   Diagnosis Date Noted  . Hyperlipidemia   . Sore throat   . Chronic atrial fibrillation ([place]) 08/01/2016  . Chronic systolic CHF (congestive heart failure) ([place]) 08/01/2016  . Left-sided weakness 03/13/2013  . TIA (transient ischemic attack) 03/13/2013  . HTN (hypertension) 03/13/2013  . Dyslipidemia 03/13/2013  . Depression 03/13/2013  . Breast cancer ([place]) 09/01/2012  . Deafness, sensorineural 07/31/2012  . Anxiety 03/17/2011  . Arthropathia 11/28/2007    Jule Ser, PT 03/28/2019, 2:48 PM  [facility] [address] [place], [place], [address] Phone: [phone]   Fax:  [phone]  Name: Natalie Mendoza MRN: [id number] Date of Birth: 11-Jul-1932

## 2019-03-28 NOTE — Patient Instructions (Signed)
Access Code: [email or web address]  URL: [email or web address]  Date: 03/28/2019  Prepared by: Jari Favre   Exercises  Sidelying Hip Abduction - 10 reps - 1 sets - 3x daily - 7x weekly  Sit to Stand with Armchair - 10 reps - 3 sets - 1x daily - 7x weekly

## 2019-03-30 ENCOUNTER — Encounter: Payer: Self-pay | Admitting: Physical Therapy

## 2019-03-30 ENCOUNTER — Other Ambulatory Visit: Payer: Self-pay

## 2019-03-30 ENCOUNTER — Ambulatory Visit: Payer: Medicare Other | Admitting: Physical Therapy

## 2019-03-30 DIAGNOSIS — M25662 Stiffness of left knee, not elsewhere classified: Secondary | ICD-10-CM

## 2019-03-30 DIAGNOSIS — M25562 Pain in left knee: Secondary | ICD-10-CM | POA: Diagnosis not present

## 2019-03-30 DIAGNOSIS — M6281 Muscle weakness (generalized): Secondary | ICD-10-CM

## 2019-04-01 NOTE — Therapy (Signed)
[facility] [address] [place], [place], [address] Phone: [phone]   Fax:  [phone]  Physical Therapy Treatment  Patient Details  Name: Natalie Mendoza MRN: [id number] Date of Birth: 18-Apr-1933 Referring Provider (PT): Mardi Mainland, MD   Encounter Date: 03/30/2019  PT End of Session - 04/01/19 1557    Visit Number  3    Date for PT Re-Evaluation  05/21/19    PT Start Time  1102    PT Stop Time  1152    PT Time Calculation (min)  50 min    Activity Tolerance  Patient tolerated treatment well    Behavior During Therapy  [facility] for tasks assessed/performed       Past Medical History:  Diagnosis Date  . Arrhythmia    Atrial Fib  . Atrial fibrillation ([place])   . Breast cancer ([place])   . CHF (congestive heart failure) ([place])   . Chronic systolic CHF (congestive heart failure) ([place])   . Depression   . Hearing impairment   . Hx of joint problems   . Hypertension   . Osteoarthritis   . Osteoporosis   . Pelvic relaxation   . Stroke [facility])    fully resolved TIA  . Vaginal pessary present     Past Surgical History:  Procedure Laterality Date  . BREAST LUMPECTOMY    . CATARACT EXTRACTION W/ INTRAOCULAR LENS  IMPLANT, BILATERAL Bilateral     There were no vitals filed for this visit.  Subjective Assessment - 04/01/19 1559    Subjective  Pt took pain medicine 1 hour before treatment.  States she felt good after last session but was definitely tired.    Patient is accompained by:  Family member    Pertinent History  Lt TKA    Limitations  Walking;House hold activities    Patient Stated Goals  not use the cane, able to get upstairs to bedroom    Currently in Pain?  Yes    Pain Score  5     Pain Location  Knee    Pain Orientation  Left    Pain Descriptors / Indicators  Aching    Pain Type  Surgical pain    Pain Onset  More than a month ago    Pain Frequency  Intermittent    Multiple Pain  Sites  No         [facility] PT Assessment - 04/01/19 0001      AROM   Left Knee Flexion  113                   [facility] Adult PT Treatment/Exercise - 04/01/19 0001      Knee/Hip Exercises: Aerobic   Stationary Bike  ROM x 6 min   able to to do full revolutions     Knee/Hip Exercises: Machines for Strengthening   Cybex Leg Press  seat 7 30# bilateral 30x ; Lt LE 1 plate      Knee/Hip Exercises: Standing   Forward Step Up  Left;10 reps;Step Height: 2";Hand Hold: 1   cue to bend knee   Other Standing Knee Exercises  weight shifting 3 ways      Knee/Hip Exercises: Supine   Heel Slides  AAROM;Left;1 set;5 reps   5 sec hold - 113 deg flexion     Vasopneumatic   Number Minutes Vasopneumatic   15 minutes    Vasopnuematic Location   Knee    Vasopneumatic Pressure  Low    Vasopneumatic Temperature   3 flakes               PT Short Term Goals - 03/26/19 1358      PT SHORT TERM GOAL #1   Title  Pt will report she is back to at least 60% of her normal activites around the home    Time  4    Period  Weeks    Status  New    Target Date  04/23/19      PT SHORT TERM GOAL #2   Title  Pt will be ind with initial HEP    Time  4    Period  Weeks    Status  New    Target Date  04/23/19      PT SHORT TERM GOAL #3   Title  Pt will be able to demonstrate Lt knee ext to 0 and flexion to >120 deg for improved function    Time  6    Period  Weeks    Status  New    Target Date  05/07/19        PT Long Term Goals - 03/26/19 1350      PT LONG TERM GOAL #1   Title  Pt will demonstrate 5x sit to stand in < 13 sec.    Baseline  19 sec    Time  8    Period  Weeks    Status  New    Target Date  05/21/19      PT LONG TERM GOAL #2   Title  FOTO< or = to 45% limted    Baseline  69% limited    Time  8    Period  Weeks    Status  New    Target Date  05/21/19      PT LONG TERM GOAL #3   Title  Pt will be able to get up and down stairs to the second floor in her  home so she can stay in her bedroom.    Time  8    Period  Weeks    Status  New    Target Date  05/21/19      PT LONG TERM GOAL #4   Title  Pt will report she is able to manage her pain enough so that she can return to walking around the store for at least 30 minutes at a time.    Time  8    Period  Weeks    Status  New    Target Date  05/21/19      PT LONG TERM GOAL #5   Title  Pt will be ind with advanced HEP    Time  8    Period  Weeks    Status  New    Target Date  05/21/19            Plan - 04/01/19 1603    Clinical Impression Statement  Pt did well with exercises and felt better at the end of treatment  She now has 113 degrees of knee flexion and was able to make full revolutions on the bike.  She does well on the leg press machine with cues to really straighten her Lt knee as much as possible.  Pt will continue to benefit from skilled PT for ROM and quad strength.    PT Treatment/Interventions  ADLs/Self Care Home Management;Aquatic Therapy;Biofeedback;Cryotherapy;Electrical Stimulation;Moist Heat;Neuromuscular re-education;Passive range of motion;Dry needling;Manual techniques;Patient/family  education;Therapeutic exercise;Therapeutic activities;Gait training    PT Next Visit Plan  try step up on 4" step and progress leg press as tolerated, quad strengthening, ROM, bike for ROM, nustep strength, small step ups and add resistance as tolerated, vaso for swelling post treatment    PT Home Exercise Plan  Access Code: [email or web address]    Consulted and Agree with Plan of Care  Patient       Patient will benefit from skilled therapeutic intervention in order to improve the following deficits and impairments:  Abnormal gait, Pain, Increased fascial restricitons, Impaired flexibility, Decreased strength, Decreased range of motion, Difficulty walking, Decreased skin integrity, Increased edema  Visit Diagnosis: Acute pain of left knee  Stiffness of left knee, not elsewhere  classified  Muscle weakness (generalized)     Problem List Patient Active Problem List   Diagnosis Date Noted  . Hyperlipidemia   . Sore throat   . Chronic atrial fibrillation ([place]) 08/01/2016  . Chronic systolic CHF (congestive heart failure) ([place]) 08/01/2016  . Left-sided weakness 03/13/2013  . TIA (transient ischemic attack) 03/13/2013  . HTN (hypertension) 03/13/2013  . Dyslipidemia 03/13/2013  . Depression 03/13/2013  . Breast cancer ([place]) 09/01/2012  . Deafness, sensorineural 07/31/2012  . Anxiety 03/17/2011  . Arthropathia 11/28/2007    Jule Ser, PT 04/01/2019, 4:05 PM  [facility] [address] [place], [place], [address] Phone: [phone]   Fax:  [phone]  Name: Natalie Mendoza MRN: [id number] Date of Birth: 11-18-1932

## 2019-04-02 ENCOUNTER — Encounter: Payer: Self-pay | Admitting: Physical Therapy

## 2019-04-02 ENCOUNTER — Ambulatory Visit: Payer: Medicare Other | Admitting: Physical Therapy

## 2019-04-02 ENCOUNTER — Other Ambulatory Visit: Payer: Self-pay

## 2019-04-02 DIAGNOSIS — M25662 Stiffness of left knee, not elsewhere classified: Secondary | ICD-10-CM

## 2019-04-02 DIAGNOSIS — M25562 Pain in left knee: Secondary | ICD-10-CM

## 2019-04-02 DIAGNOSIS — M6281 Muscle weakness (generalized): Secondary | ICD-10-CM

## 2019-04-02 NOTE — Therapy (Signed)
[facility] [address] [place], [place], [address] Phone: [phone]   Fax:  [phone]  Physical Therapy Treatment  Patient Details  Name: Natalie Mendoza MRN: [id number] Date of Birth: [birth year] Referring Provider (PT): Mardi Mainland, MD   Encounter Date: 04/02/2019  PT End of Session - 04/02/19 1217    Visit Number  4    Date for PT Re-Evaluation  05/21/19    PT Start Time  1146    PT Stop Time  [id number]    PT Time Calculation (min)  49 min    Activity Tolerance  Patient tolerated treatment well    Behavior During Therapy  [facility] for tasks assessed/performed       Past Medical History:  Diagnosis Date  . Arrhythmia    Atrial Fib  . Atrial fibrillation ([place])   . Breast cancer ([place])   . CHF (congestive heart failure) ([place])   . Chronic systolic CHF (congestive heart failure) ([place])   . Depression   . Hearing impairment   . Hx of joint problems   . Hypertension   . Osteoarthritis   . Osteoporosis   . Pelvic relaxation   . Stroke [facility])    fully resolved TIA  . Vaginal pessary present     Past Surgical History:  Procedure Laterality Date  . BREAST LUMPECTOMY    . CATARACT EXTRACTION W/ INTRAOCULAR LENS  IMPLANT, BILATERAL Bilateral     There were no vitals filed for this visit.  Subjective Assessment - 04/02/19 1151    Subjective  My knee does not hurt like it used to. I think I am 55% better.    Currently in Pain?  Yes    Pain Score  3     Pain Location  Knee    Pain Orientation  Left    Pain Descriptors / Indicators  Dull;Aching    Aggravating Factors   Not sure, some exercises.    Pain Relieving Factors  ice, meds    Multiple Pain Sites  No                       [facility] Adult PT Treatment/Exercise - 04/02/19 0001      Neuro Re-ed    Neuro Re-ed Details   Walking laps around the clinic to help increase her awareness of bending her knee more consistently.        Knee/Hip Exercises: Aerobic   Nustep  L1 x 6 min, PTA monitored and discussed pain. No pain      Knee/Hip Exercises: Machines for Strengthening   Cybex Leg Press  Seat 7, 35# 10x, 40# 10 Bil       Knee/Hip Exercises: Seated   Long Arc Quad  Strengthening;Left;2 sets;10 reps    Long Arc Quad Limitations  Added to [employer]      Knee/Hip Exercises: Supine   Short Arc [employer]  Strengthening;Left;3 sets;10 reps    Short Arc [employer] Limitations  2# wts      Vasopneumatic   Number Minutes Vasopneumatic   15 minutes    Vasopnuematic Location   Knee    Vasopneumatic Pressure  Low    Vasopneumatic Temperature   3 flakes               PT Short Term Goals - 03/26/19 1358      PT SHORT TERM GOAL #1   Title  Pt will report she is back  to at least 60% of her normal activites around the home    Time  4    Period  Weeks    Status  New    Target Date  04/23/19      PT SHORT TERM GOAL #2   Title  Pt will be ind with initial HEP    Time  4    Period  Weeks    Status  New    Target Date  04/23/19      PT SHORT TERM GOAL #3   Title  Pt will be able to demonstrate Lt knee ext to 0 and flexion to >120 deg for improved function    Time  6    Period  Weeks    Status  New    Target Date  05/07/19        PT Long Term Goals - 03/26/19 1350      PT LONG TERM GOAL #1   Title  Pt will demonstrate 5x sit to stand in < 13 sec.    Baseline  19 sec    Time  8    Period  Weeks    Status  New    Target Date  05/21/19      PT LONG TERM GOAL #2   Title  FOTO< or = to 45% limted    Baseline  69% limited    Time  8    Period  Weeks    Status  New    Target Date  05/21/19      PT LONG TERM GOAL #3   Title  Pt will be able to get up and down stairs to the second floor in her home so she can stay in her bedroom.    Time  8    Period  Weeks    Status  New    Target Date  05/21/19      PT LONG TERM GOAL #4   Title  Pt will report she is able to manage her pain enough so that she can  return to walking around the store for at least 30 minutes at a time.    Time  8    Period  Weeks    Status  New    Target Date  05/21/19      PT LONG TERM GOAL #5   Title  Pt will be ind with advanced HEP    Time  8    Period  Weeks    Status  New    Target Date  05/21/19            Plan - 04/02/19 1154    Clinical Impression Statement  Pt arrives today with no knee pain. She verbally reports she is trying to bend her knee more often. She walks around the clinic unaware of keeping her knee locked in extension. Once she is verbally cued to bend her knee she can. We spent time increasing her awareness of this. Pt could walk several laps without her cane bending her knee appropriately. Pt was able to add more resistance to her exercises for knee strength.    Personal Factors and Comorbidities  Comorbidity 1    Comorbidities  Lt TKA    Stability/Clinical Decision Making  Stable/Uncomplicated    Rehab Potential  Excellent    PT Frequency  2x / week    PT Duration  8 weeks    PT Treatment/Interventions  ADLs/Self Care Home Management;Aquatic Therapy;Biofeedback;Cryotherapy;Electrical Stimulation;Moist  Heat;Neuromuscular re-education;Passive range of motion;Dry needling;Manual techniques;Patient/family education;Therapeutic exercise;Therapeutic activities;Gait training    PT Next Visit Plan  Try step ups, Nustep, leg press ( other quad strengthening exs) Game ready at end of session.    PT Home Exercise Plan  Access Code: [email or web address] and Agree with Plan of Care  Patient       Patient will benefit from skilled therapeutic intervention in order to improve the following deficits and impairments:  Abnormal gait, Pain, Increased fascial restricitons, Impaired flexibility, Decreased strength, Decreased range of motion, Difficulty walking, Decreased skin integrity, Increased edema  Visit Diagnosis: Acute pain of left knee  Stiffness of left knee, not elsewhere  classified  Muscle weakness (generalized)     Problem List Patient Active Problem List   Diagnosis Date Noted  . Hyperlipidemia   . Sore throat   . Chronic atrial fibrillation ([place]) 08/01/2016  . Chronic systolic CHF (congestive heart failure) ([place]) 08/01/2016  . Left-sided weakness 03/13/2013  . TIA (transient ischemic attack) 03/13/2013  . HTN (hypertension) 03/13/2013  . Dyslipidemia 03/13/2013  . Depression 03/13/2013  . Breast cancer ([place]) 09/01/2012  . Deafness, sensorineural 07/31/2012  . Anxiety 03/17/2011  . Arthropathia 11/28/2007    ,, PTA 04/02/2019, 12:26 PM  [facility] [address] [place], [place], [address] Phone: [phone]   Fax:  [phone]  Name: Natalie Mendoza MRN: [id number] Date of Birth: 06/12/1933

## 2019-04-04 ENCOUNTER — Other Ambulatory Visit: Payer: Self-pay

## 2019-04-04 ENCOUNTER — Ambulatory Visit: Payer: Medicare Other | Admitting: Physical Therapy

## 2019-04-04 ENCOUNTER — Encounter: Payer: Self-pay | Admitting: Physical Therapy

## 2019-04-04 DIAGNOSIS — M6281 Muscle weakness (generalized): Secondary | ICD-10-CM

## 2019-04-04 DIAGNOSIS — M25562 Pain in left knee: Secondary | ICD-10-CM

## 2019-04-04 DIAGNOSIS — M25662 Stiffness of left knee, not elsewhere classified: Secondary | ICD-10-CM

## 2019-04-04 NOTE — Therapy (Signed)
[facility] [address] [place], [place], [address] Phone: [phone]   Fax:  [phone]  Physical Therapy Treatment  Patient Details  Name: Natalie Mendoza MRN: [id number] Date of Birth: 1933-03-05 Referring Provider (PT): Mardi Mainland, MD   Encounter Date: 04/04/2019  PT End of Session - 04/04/19 1142    Visit Number  5    Date for PT Re-Evaluation  05/21/19    PT Start Time  1142    PT Stop Time  1240    PT Time Calculation (min)  58 min    Activity Tolerance  Patient tolerated treatment well    Behavior During Therapy  [facility] for tasks assessed/performed       Past Medical History:  Diagnosis Date  . Arrhythmia    Atrial Fib  . Atrial fibrillation ([place])   . Breast cancer ([place])   . CHF (congestive heart failure) ([place])   . Chronic systolic CHF (congestive heart failure) ([place])   . Depression   . Hearing impairment   . Hx of joint problems   . Hypertension   . Osteoarthritis   . Osteoporosis   . Pelvic relaxation   . Stroke [facility])    fully resolved TIA  . Vaginal pessary present     Past Surgical History:  Procedure Laterality Date  . BREAST LUMPECTOMY    . CATARACT EXTRACTION W/ INTRAOCULAR LENS  IMPLANT, BILATERAL Bilateral     There were no vitals filed for this visit.  Subjective Assessment - 04/04/19 1144    Subjective  I did not walk as much as I should have yesterday.    Patient is accompained by:  Family member    Pertinent History  Lt TKA    Limitations  Walking;House hold activities    Currently in Pain?  Yes    Pain Score  3     Pain Location  Knee    Pain Orientation  Left    Pain Descriptors / Indicators  Sore    Multiple Pain Sites  No                       [facility] Adult PT Treatment/Exercise - 04/04/19 0001      Knee/Hip Exercises: Aerobic   Nustep  L1 x 8 min, PTA monitored and discussed pain. No pain      Knee/Hip Exercises: Machines for  Strengthening   Cybex Leg Press  Seat 6: Bil 40# 2x10      Knee/Hip Exercises: Standing   Forward Step Up  Left;1 set;10 reps;Hand Hold: 2;Step Height: 6"    SLS  3x 10 sec LTLE light UE      Knee/Hip Exercises: Seated   Long Arc Quad  Strengthening;Left;2 sets;10 reps;Weights    Long Arc Quad Weight  2 lbs.      Knee/Hip Exercises: Supine   Short Arc Quad Sets  Strengthening;Left;2 sets;10 reps    Short Arc Quad Sets Limitations  3#    B[employer]  Strengthening;Both;1 set;10 reps      Vasopneumatic   Number Minutes Vasopneumatic   15 minutes    Vasopnuematic Location   Knee    Vasopneumatic Pressure  Medium    Vasopneumatic Temperature   3 flakes      Manual Therapy   Joint Mobilization  Patella mobs, scar mobs    Soft tissue mobilization  Lt knee    Myofascial Release  Anterior knee, distal scar  PT Short Term Goals - 04/04/19 1207      PT SHORT TERM GOAL #1   Title  Pt will report she is back to at least 60% of her normal activites around the home    Time  4    Period  Weeks    Status  Achieved      PT SHORT TERM GOAL #2   Title  Pt will be ind with initial HEP    Time  4    Period  Weeks    Status  Achieved   daughter assists at times       PT Long Term Goals - 03/26/19 1350      PT LONG TERM GOAL #1   Title  Pt will demonstrate 5x sit to stand in < 13 sec.    Baseline  19 sec    Time  8    Period  Weeks    Status  New    Target Date  05/21/19      PT LONG TERM GOAL #2   Title  FOTO< or = to 45% limted    Baseline  69% limited    Time  8    Period  Weeks    Status  New    Target Date  05/21/19      PT LONG TERM GOAL #3   Title  Pt will be able to get up and down stairs to the second floor in her home so she can stay in her bedroom.    Time  8    Period  Weeks    Status  New    Target Date  05/21/19      PT LONG TERM GOAL #4   Title  Pt will report she is able to manage her pain enough so that she can return to walking  around the store for at least 30 minutes at a time.    Time  8    Period  Weeks    Status  New    Target Date  05/21/19      PT LONG TERM GOAL #5   Title  Pt will be ind with advanced HEP    Time  8    Period  Weeks    Status  New    Target Date  05/21/19            Plan - 04/04/19 1146    Clinical Impression Statement  Pt arrives with "just a littel pain." She is tolerating progressive resistance well in order to gain strength in her LT quad. She till requires VC to bend her knee when she is walking but she is showing better consistency. Soft tissues around the knee are tight, maunal work was helpful to loosen the tissues of the anterior knee. No reported pain with treatment.    Personal Factors and Comorbidities  Comorbidity 1    Comorbidities  Lt TKA    Stability/Clinical Decision Making  Stable/Uncomplicated    Rehab Potential  Excellent    PT Frequency  2x / week    PT Duration  8 weeks    PT Treatment/Interventions  ADLs/Self Care Home Management;Aquatic Therapy;Biofeedback;Cryotherapy;Electrical Stimulation;Moist Heat;Neuromuscular re-education;Passive range of motion;Dry needling;Manual techniques;Patient/family education;Therapeutic exercise;Therapeutic activities;Gait training    PT Next Visit Plan  Step ups, Nustep, leg press ( other quad strengthening exs) Game ready at end of session. Manual to soft tissues around the knee including scar mobs    PT Home Exercise Plan  Access Code: [email or web address] and Agree with Plan of Care  Patient       Patient will benefit from skilled therapeutic intervention in order to improve the following deficits and impairments:  Abnormal gait, Pain, Increased fascial restricitons, Impaired flexibility, Decreased strength, Decreased range of motion, Difficulty walking, Decreased skin integrity, Increased edema  Visit Diagnosis: Acute pain of left knee  Stiffness of left knee, not elsewhere classified  Muscle weakness  (generalized)     Problem List Patient Active Problem List   Diagnosis Date Noted  . Hyperlipidemia   . Sore throat   . Chronic atrial fibrillation ([place]) 08/01/2016  . Chronic systolic CHF (congestive heart failure) ([place]) 08/01/2016  . Left-sided weakness 03/13/2013  . TIA (transient ischemic attack) 03/13/2013  . HTN (hypertension) 03/13/2013  . Dyslipidemia 03/13/2013  . Depression 03/13/2013  . Breast cancer ([place]) 09/01/2012  . Deafness, sensorineural 07/31/2012  . Anxiety 03/17/2011  . Arthropathia 11/28/2007    COCHRAN,JENNIFER, PTA 04/04/2019, 12:31 PM  [facility] [address] [place], [place], [address] Phone: [phone]   Fax:  [phone]  Name: Natalie Mendoza MRN: [id number] Date of Birth: 1932-08-31

## 2019-04-06 ENCOUNTER — Other Ambulatory Visit: Payer: Self-pay

## 2019-04-06 ENCOUNTER — Encounter: Payer: Self-pay | Admitting: Physical Therapy

## 2019-04-06 ENCOUNTER — Ambulatory Visit: Payer: Medicare Other | Admitting: Physical Therapy

## 2019-04-06 DIAGNOSIS — M25562 Pain in left knee: Secondary | ICD-10-CM

## 2019-04-06 DIAGNOSIS — M6281 Muscle weakness (generalized): Secondary | ICD-10-CM

## 2019-04-06 DIAGNOSIS — M25662 Stiffness of left knee, not elsewhere classified: Secondary | ICD-10-CM

## 2019-04-06 NOTE — Patient Instructions (Signed)
Access Code: [email or web address]  URL: [email or web address]  Date: 04/06/2019  Prepared by: Jari Favre   Exercises  Sidelying Hip Abduction - 10 reps - 1 sets - 3x daily - 7x weekly  Sit to Stand with Armchair - 10 reps - 3 sets - 1x daily - 7x weekly  Seated Long Arc Quad - 10 reps - 3 sets - 1x daily - 7x weekly  Forward Step Up - 10 reps - 3 sets - 1x daily - 7x weekly  Heel rises with counter support - 10 reps - 3 sets - 1x daily - 7x weekly

## 2019-04-06 NOTE — Therapy (Signed)
[facility] [address], [place], [place], [address] Phone: [phone]   Fax:  [phone]  Physical Therapy Treatment  Patient Details  Name: Natalie Mendoza MRN: [id number] Date of Birth: 1933-01-18 Referring Provider (PT): Mardi Mainland, MD   Encounter Date: 04/06/2019  PT End of Session - 04/06/19 1138    Visit Number  6    Date for PT Re-Evaluation  05/21/19    PT Start Time  1100    PT Stop Time  1148    PT Time Calculation (min)  48 min    Activity Tolerance  Patient tolerated treatment well    Behavior During Therapy  [facility] for tasks assessed/performed       Past Medical History:  Diagnosis Date  . Arrhythmia    Atrial Fib  . Atrial fibrillation ([place])   . Breast cancer ([place])   . CHF (congestive heart failure) ([place])   . Chronic systolic CHF (congestive heart failure) ([place])   . Depression   . Hearing impairment   . Hx of joint problems   . Hypertension   . Osteoarthritis   . Osteoporosis   . Pelvic relaxation   . Stroke [facility])    fully resolved TIA  . Vaginal pessary present     Past Surgical History:  Procedure Laterality Date  . BREAST LUMPECTOMY    . CATARACT EXTRACTION W/ INTRAOCULAR LENS  IMPLANT, BILATERAL Bilateral     There were no vitals filed for this visit.  Subjective Assessment - 04/06/19 1108    Subjective  pt states she did not sleep much last night.  Having                       [place] Adult PT Treatment/Exercise - 04/06/19 0001      Knee/Hip Exercises: Machines for Strengthening   Cybex Leg Press  Seat 6: Bil 40# 3x10   quad set with knee ext and cue to use Lt LE more     Knee/Hip Exercises: Standing   Heel Raises  Both;20 reps   on black foam - one finger support, close supervision   Forward Step Up  Left;10 reps;Hand Hold: 2;Step Height: 6";2 sets    Other Standing Knee Exercises  marching while standing on foam mat - 20x - one UE support       Knee/Hip Exercises: Seated   Long Arc Quad  Strengthening;Left;2 sets;10 reps;Weights    Long Arc Quad Weight  3 lbs.      Knee/Hip Exercises: Supine   Short Arc Quad Sets  Strengthening;Left;2 sets;10 reps    Short Arc Quad Sets Limitations  3#      Vasopneumatic   Number Minutes Vasopneumatic   15 minutes    Vasopnuematic Location   Knee    Vasopneumatic Pressure  Medium    Vasopneumatic Temperature   3 flakes             PT Education - 04/06/19 1138    Education Details  Access Code: [email or web address]    Person(s) Educated  Patient    Methods  Explanation;Demonstration;Handout;Verbal cues    Comprehension  Verbalized understanding;Returned demonstration       PT Short Term Goals - 04/04/19 1207      PT SHORT TERM GOAL #1   Title  Pt will report she is back to at least 60% of her normal activites around the home    Time  4  Period  Weeks    Status  Achieved      PT SHORT TERM GOAL #2   Title  Pt will be ind with initial HEP    Time  4    Period  Weeks    Status  Achieved   daughter assists at times       PT Long Term Goals - 03/26/19 1350      PT LONG TERM GOAL #1   Title  Pt will demonstrate 5x sit to stand in < 13 sec.    Baseline  19 sec    Time  8    Period  Weeks    Status  New    Target Date  05/21/19      PT LONG TERM GOAL #2   Title  FOTO< or = to 45% limted    Baseline  69% limited    Time  8    Period  Weeks    Status  New    Target Date  05/21/19      PT LONG TERM GOAL #3   Title  Pt will be able to get up and down stairs to the second floor in her home so she can stay in her bedroom.    Time  8    Period  Weeks    Status  New    Target Date  05/21/19      PT LONG TERM GOAL #4   Title  Pt will report she is able to manage her pain enough so that she can return to walking around the store for at least 30 minutes at a time.    Time  8    Period  Weeks    Status  New    Target Date  05/21/19      PT LONG TERM GOAL #5   Title   Pt will be ind with advanced HEP    Time  8    Period  Weeks    Status  New    Target Date  05/21/19            Plan - 04/06/19 1139    Clinical Impression Statement  Pt reports she felt better after doing the step ups and leg press today.  Pt's HEP was adjusted to add more standing and stepping exercises in order to progress quad strength.  Pt needed cues to really squeeze quad during knee extension portion of the exercises.  She is still lacking several degrees of active knee ext ROM.  Pt will benefit from skilled PT to continue to work towards functional goals.    PT Treatment/Interventions  ADLs/Self Care Home Management;Aquatic Therapy;Biofeedback;Cryotherapy;Electrical Stimulation;Moist Heat;Neuromuscular re-education;Passive range of motion;Dry needling;Manual techniques;Patient/family education;Therapeutic exercise;Therapeutic activities;Gait training    PT Next Visit Plan  Step ups, Nustep, leg press ( other quad strengthening exs) Game ready at end of session. Manual to soft tissues around the knee including scar mobs    PT Home Exercise Plan  Access Code: [email or web address] and Agree with Plan of Care  Patient       Patient will benefit from skilled therapeutic intervention in order to improve the following deficits and impairments:  Abnormal gait, Pain, Increased fascial restricitons, Impaired flexibility, Decreased strength, Decreased range of motion, Difficulty walking, Decreased skin integrity, Increased edema  Visit Diagnosis: Acute pain of left knee  Stiffness of left knee, not elsewhere classified  Muscle weakness (generalized)     Problem List Patient  Active Problem List   Diagnosis Date Noted  . Hyperlipidemia   . Sore throat   . Chronic atrial fibrillation ([place]) 08/01/2016  . Chronic systolic CHF (congestive heart failure) ([place]) 08/01/2016  . Left-sided weakness 03/13/2013  . TIA (transient ischemic attack) 03/13/2013  . HTN (hypertension) 03/13/2013   . Dyslipidemia 03/13/2013  . Depression 03/13/2013  . Breast cancer ([place]) 09/01/2012  . Deafness, sensorineural 07/31/2012  . Anxiety 03/17/2011  . Arthropathia 11/28/2007    Jule Ser, PT 04/06/2019, 11:50 AM  [facility] [address] [place], [place], [address] Phone: [phone]   Fax:  [phone]  Name: Natalie Mendoza MRN: [id number] Date of Birth: 1933-05-12

## 2019-04-09 ENCOUNTER — Encounter: Payer: Self-pay | Admitting: Physical Therapy

## 2019-04-09 ENCOUNTER — Ambulatory Visit: Payer: Medicare Other | Admitting: Physical Therapy

## 2019-04-09 ENCOUNTER — Other Ambulatory Visit: Payer: Self-pay

## 2019-04-09 DIAGNOSIS — M25562 Pain in left knee: Secondary | ICD-10-CM

## 2019-04-09 DIAGNOSIS — M25662 Stiffness of left knee, not elsewhere classified: Secondary | ICD-10-CM

## 2019-04-09 DIAGNOSIS — M6281 Muscle weakness (generalized): Secondary | ICD-10-CM

## 2019-04-09 NOTE — Therapy (Signed)
[facility] [address] [place], [place], [address] Phone: [phone]   Fax:  [phone]  Physical Therapy Treatment  Patient Details  Name: Natalie Mendoza MRN: [id number] Date of Birth: 04-26-1933 Referring Provider (PT): Mardi Mainland, MD   Encounter Date: 04/09/2019  PT End of Session - 04/09/19 1148    Visit Number  7    Date for PT Re-Evaluation  05/21/19    PT Start Time  1147    PT Stop Time  1235    PT Time Calculation (min)  48 min    Activity Tolerance  Patient tolerated treatment well    Behavior During Therapy  [facility] for tasks assessed/performed       Past Medical History:  Diagnosis Date  . Arrhythmia    Atrial Fib  . Atrial fibrillation ([place])   . Breast cancer ([place])   . CHF (congestive heart failure) ([place])   . Chronic systolic CHF (congestive heart failure) ([place])   . Depression   . Hearing impairment   . Hx of joint problems   . Hypertension   . Osteoarthritis   . Osteoporosis   . Pelvic relaxation   . Stroke [facility])    fully resolved TIA  . Vaginal pessary present     Past Surgical History:  Procedure Laterality Date  . BREAST LUMPECTOMY    . CATARACT EXTRACTION W/ INTRAOCULAR LENS  IMPLANT, BILATERAL Bilateral     There were no vitals filed for this visit.  Subjective Assessment - 04/09/19 1151    Subjective  Had some dizziness this AM, no complaints of knee pain.    Patient is accompained by:  Family member    Pertinent History  Lt TKA    Limitations  Walking;House hold activities    Currently in Pain?  No/denies    Multiple Pain Sites  No         [facility] PT Assessment - 04/09/19 0001      AROM   Left Knee Extension  3   Passive 0, no shoe   Left Knee Flexion  115                   [facility] Adult PT Treatment/Exercise - 04/09/19 0001      Knee/Hip Exercises: Aerobic   Nustep  L3 x 8 min PTa present to monitor      Knee/Hip Exercises: Machines  for Strengthening   Cybex Leg Press  Seat 6 Bil 45# 15x2, LTLE 30# 10x 2       Knee/Hip Exercises: Standing   Forward Step Up  Left;1 set;5 reps;10 reps;15 reps;Hand Hold: 2;Step Height: 6"    Step Down  Left;1 set;10 reps;Hand Hold: 2;Step Height: 6"    Step Down Limitations  VC to keep LT foot straight       Knee/Hip Exercises: Seated   Long Arc Quad  AROM;Strengthening;Left;2 sets;15 reps;Weights    Long Arc Quad Weight  3 lbs.      Knee/Hip Exercises: Supine   Short Arc Quad Sets  Strengthening;Left;2 sets;15 reps    Short Arc Quad Sets Limitations  3#    Bridges  Strengthening;Both;1 set;15 reps      Vasopneumatic   Number Minutes Vasopneumatic   15 minutes    Vasopnuematic Location   Knee    Vasopneumatic Pressure  Medium    Vasopneumatic Temperature   3 flakes      Manual Therapy   Joint Mobilization  Patella mobs, scar mobs    Soft tissue mobilization  Lt knee   Passive knee ext stretching   Myofascial Release  Anterior knee, distal scar               PT Short Term Goals - 04/04/19 1207      PT SHORT TERM GOAL #1   Title  Pt will report she is back to at least 60% of her normal activites around the home    Time  4    Period  Weeks    Status  Achieved      PT SHORT TERM GOAL #2   Title  Pt will be ind with initial HEP    Time  4    Period  Weeks    Status  Achieved   daughter assists at times       PT Long Term Goals - 03/26/19 1350      PT LONG TERM GOAL #1   Title  Pt will demonstrate 5x sit to stand in < 13 sec.    Baseline  19 sec    Time  8    Period  Weeks    Status  New    Target Date  05/21/19      PT LONG TERM GOAL #2   Title  FOTO< or = to 45% limted    Baseline  69% limited    Time  8    Period  Weeks    Status  New    Target Date  05/21/19      PT LONG TERM GOAL #3   Title  Pt will be able to get up and down stairs to the second floor in her home so she can stay in her bedroom.    Time  8    Period  Weeks    Status  New     Target Date  05/21/19      PT LONG TERM GOAL #4   Title  Pt will report she is able to manage her pain enough so that she can return to walking around the store for at least 30 minutes at a time.    Time  8    Period  Weeks    Status  New    Target Date  05/21/19      PT LONG TERM GOAL #5   Title  Pt will be ind with advanced HEP    Time  8    Period  Weeks    Status  New    Target Date  05/21/19            Plan - 04/09/19 1150    Clinical Impression Statement  Pt arrives today with no knee pain. She achieved 115 degrees of knee flexion at the end of the session and 3 degrees of active extension, passive is 0. pt requires 2 hand rails to descend stairs. Distal scar has decreased mobility.    Personal Factors and Comorbidities  Comorbidity 1    Comorbidities  Lt TKA    Stability/Clinical Decision Making  Stable/Uncomplicated    Rehab Potential  Excellent    PT Frequency  2x / week    PT Duration  8 weeks    PT Treatment/Interventions  ADLs/Self Care Home Management;Aquatic Therapy;Biofeedback;Cryotherapy;Electrical Stimulation;Moist Heat;Neuromuscular re-education;Passive range of motion;Dry needling;Manual techniques;Patient/family education;Therapeutic exercise;Therapeutic activities;Gait training    PT Next Visit Plan  Step ups, Nustep, leg press ( other quad strengthening exs) Game ready at  end of session. Manual to soft tissues around the knee including scar mobs    PT Home Exercise Plan  Access Code: [email or web address] and Agree with Plan of Care  Patient       Patient will benefit from skilled therapeutic intervention in order to improve the following deficits and impairments:  Abnormal gait, Pain, Increased fascial restricitons, Impaired flexibility, Decreased strength, Decreased range of motion, Difficulty walking, Decreased skin integrity, Increased edema  Visit Diagnosis: Stiffness of left knee, not elsewhere classified  Acute pain of left knee  Muscle  weakness (generalized)     Problem List Patient Active Problem List   Diagnosis Date Noted  . Hyperlipidemia   . Sore throat   . Chronic atrial fibrillation ([place]) 08/01/2016  . Chronic systolic CHF (congestive heart failure) ([place]) 08/01/2016  . Left-sided weakness 03/13/2013  . TIA (transient ischemic attack) 03/13/2013  . HTN (hypertension) 03/13/2013  . Dyslipidemia 03/13/2013  . Depression 03/13/2013  . Breast cancer ([place]) 09/01/2012  . Deafness, sensorineural 07/31/2012  . Anxiety 03/17/2011  . Arthropathia 11/28/2007    Lakeyta Vandenheuvel, PTA 04/09/2019, 12:28 PM  [facility] [address], [place], [place], [address] Phone: [phone]   Fax:  [phone]  Name: Natalie Mendoza MRN: [id number] Date of Birth: 1933/03/05

## 2019-04-11 ENCOUNTER — Other Ambulatory Visit: Payer: Self-pay

## 2019-04-11 ENCOUNTER — Ambulatory Visit: Payer: Medicare Other | Admitting: Physical Therapy

## 2019-04-11 ENCOUNTER — Encounter: Payer: Self-pay | Admitting: Physical Therapy

## 2019-04-11 DIAGNOSIS — M25562 Pain in left knee: Secondary | ICD-10-CM

## 2019-04-11 DIAGNOSIS — M25662 Stiffness of left knee, not elsewhere classified: Secondary | ICD-10-CM

## 2019-04-11 DIAGNOSIS — M6281 Muscle weakness (generalized): Secondary | ICD-10-CM

## 2019-04-11 NOTE — Therapy (Signed)
[facility] [address], [place], [place], [address] Phone: [phone]   Fax:  [phone]  Physical Therapy Treatment  Patient Details  Name: Natalie Mendoza MRN: [id number] Date of Birth: [birth year] Referring Provider (PT): Mardi Mainland, MD   Encounter Date: 04/11/2019  PT End of Session - 04/11/19 1150    Visit Number  8    Date for PT Re-Evaluation  05/21/19    PT Start Time  6962    PT Stop Time  1245    PT Time Calculation (min)  60 min    Activity Tolerance  Patient tolerated treatment well    Behavior During Therapy  [facility] for tasks assessed/performed       Past Medical History:  Diagnosis Date  . Arrhythmia    Atrial Fib  . Atrial fibrillation ([place])   . Breast cancer ([place])   . CHF (congestive heart failure) ([place])   . Chronic systolic CHF (congestive heart failure) ([place])   . Depression   . Hearing impairment   . Hx of joint problems   . Hypertension   . Osteoarthritis   . Osteoporosis   . Pelvic relaxation   . Stroke [facility])    fully resolved TIA  . Vaginal pessary present     Past Surgical History:  Procedure Laterality Date  . BREAST LUMPECTOMY    . CATARACT EXTRACTION W/ INTRAOCULAR LENS  IMPLANT, BILATERAL Bilateral     There were no vitals filed for this visit.  Subjective Assessment - 04/11/19 1152    Subjective  My knee feels good, I just get tired doing things like getting ready to come here.    Patient is accompained by:  Family member    Pertinent History  Lt TKA    Currently in Pain?  No/denies    Multiple Pain Sites  No         [facility] PT Assessment - 04/11/19 0001      Standardized Balance Assessment   Standardized Balance Assessment  --   5x sit to stand today 12 sec, met goal                  [facility] Adult PT Treatment/Exercise - 04/11/19 0001      Knee/Hip Exercises: Aerobic   Nustep  L3 x 10 min PTA present to monitor      Knee/Hip  Exercises: Machines for Strengthening   Cybex Leg Press  Seat 6 Bil 50# 15x2, LTLE 30# 10x 2       Knee/Hip Exercises: Seated   Long Arc Quad  Strengthening;Left;2 sets;10 reps;Weights    Long Arc Quad Weight  4 lbs.   Tc to get hinging movement of knee   Marching  Strengthening;Left;2 sets;10 reps;Weights    Marching Weights  4 lbs.      Vasopneumatic   Number Minutes Vasopneumatic   15 minutes    Vasopnuematic Location   Knee    Vasopneumatic Pressure  Medium    Vasopneumatic Temperature   3 flakes      Manual Therapy   Joint Mobilization  Patella mobs, scar mobs    Soft tissue mobilization  Lt knee   Passive knee ext stretching   Myofascial Release  Anterior knee, distal scar   [place] assist to Lt quad              PT Short Term Goals - 04/04/19 1207      PT SHORT TERM GOAL #1  Title  Pt will report she is back to at least 60% of her normal activites around the home    Time  4    Period  Weeks    Status  Achieved      PT SHORT TERM GOAL #2   Title  Pt will be ind with initial HEP    Time  4    Period  Weeks    Status  Achieved   daughter assists at times       PT Long Term Goals - 04/11/19 1204      PT LONG TERM GOAL #1   Title  Pt will demonstrate 5x sit to stand in < 13 sec.    Baseline  19 sec    Time  8    Period  Weeks    Status  Achieved   12 sec           Plan - 04/11/19 1151    Clinical Impression Statement  Pt arrives tired but no complaints of knee pain. Pt is more consistently bending her knee when she ambulates and even her speed has increased over the last few visits. She consistently tolerates more resistance with her quad strengthening exercises. Pt benefitted from soft tissue work to her scar and quadriceps muscle.    Personal Factors and Comorbidities  Comorbidity 1    Comorbidities  Lt TKA    Stability/Clinical Decision Making  Stable/Uncomplicated    Rehab Potential  Excellent    PT Frequency  2x / week    PT Duration   8 weeks    PT Treatment/Interventions  ADLs/Self Care Home Management;Aquatic Therapy;Biofeedback;Cryotherapy;Electrical Stimulation;Moist Heat;Neuromuscular re-education;Passive range of motion;Dry needling;Manual techniques;Patient/family education;Therapeutic exercise;Therapeutic activities;Gait training    PT Next Visit Plan  Step ups, Nustep, leg press ( other quad strengthening exs) Game ready at end of session. Manual to soft tissues around the knee including scar mobs    PT Home Exercise Plan  Access Code: [email or web address]       Patient will benefit from skilled therapeutic intervention in order to improve the following deficits and impairments:  Abnormal gait, Pain, Increased fascial restricitons, Impaired flexibility, Decreased strength, Decreased range of motion, Difficulty walking, Decreased skin integrity, Increased edema  Visit Diagnosis: Stiffness of left knee, not elsewhere classified  Acute pain of left knee  Muscle weakness (generalized)     Problem List Patient Active Problem List   Diagnosis Date Noted  . Hyperlipidemia   . Sore throat   . Chronic atrial fibrillation ([place]) 08/01/2016  . Chronic systolic CHF (congestive heart failure) ([place]) 08/01/2016  . Left-sided weakness 03/13/2013  . TIA (transient ischemic attack) 03/13/2013  . HTN (hypertension) 03/13/2013  . Dyslipidemia 03/13/2013  . Depression 03/13/2013  . Breast cancer ([place]) 09/01/2012  . Deafness, sensorineural 07/31/2012  . Anxiety 03/17/2011  . Arthropathia 11/28/2007    COCHRAN,JENNIFER, PTA 04/11/2019, 2:00 PM  [facility] [address] [place], [place], [address] Phone: [phone]   Fax:  [phone]  Name: CORIANA ANGELLO MRN: [id number] Date of Birth: April 21, 1933

## 2019-04-13 ENCOUNTER — Other Ambulatory Visit: Payer: Self-pay

## 2019-04-13 ENCOUNTER — Ambulatory Visit: Payer: Medicare Other | Admitting: Physical Therapy

## 2019-04-13 ENCOUNTER — Encounter: Payer: Self-pay | Admitting: Physical Therapy

## 2019-04-13 ENCOUNTER — Encounter (INDEPENDENT_AMBULATORY_CARE_PROVIDER_SITE_OTHER): Payer: Self-pay

## 2019-04-13 DIAGNOSIS — M25562 Pain in left knee: Secondary | ICD-10-CM

## 2019-04-13 DIAGNOSIS — M6281 Muscle weakness (generalized): Secondary | ICD-10-CM

## 2019-04-13 DIAGNOSIS — M25662 Stiffness of left knee, not elsewhere classified: Secondary | ICD-10-CM

## 2019-04-13 NOTE — Patient Instructions (Signed)
Access Code: [email or web address]  URL: [email or web address]  Date: 04/13/2019  Prepared by: Venetia Night Vale Peraza   Exercises  Sidelying Hip Abduction - 10 reps - 1 sets - 3x daily - 7x weekly  Sit to Stand with Resistance Around Legs - 10 reps - 3 sets - 1x daily - 7x weekly  Seated Long Arc Quad - 10 reps - 3 sets - 1x daily - 7x weekly  Forward Step Up - 10 reps - 3 sets - 1x daily - 7x weekly  Heel rises with counter support - 10 reps - 3 sets - 1x daily - 7x weekly  Seated Hamstring Curls with Resistance - 10 reps - 3 sets - 1x daily - 7x weekly

## 2019-04-13 NOTE — Therapy (Signed)
[facility] [address], [place], [place], [address] Phone: [phone]   Fax:  [phone]  Physical Therapy Treatment  Patient Details  Name: Natalie Mendoza MRN: [id number] Date of Birth: Sep 06, 1932 Referring Provider (PT): Mardi Mainland, MD   Encounter Date: 04/13/2019  PT End of Session - 04/13/19 1056    Visit Number  9    Date for PT Re-Evaluation  05/21/19    PT Start Time  1100    PT Stop Time  1150   10 min vaso   PT Time Calculation (min)  50 min    Activity Tolerance  Patient tolerated treatment well    Behavior During Therapy  [facility] for tasks assessed/performed       Past Medical History:  Diagnosis Date  . Arrhythmia    Atrial Fib  . Atrial fibrillation ([place])   . Breast cancer ([place])   . CHF (congestive heart failure) ([place])   . Chronic systolic CHF (congestive heart failure) ([place])   . Depression   . Hearing impairment   . Hx of joint problems   . Hypertension   . Osteoarthritis   . Osteoporosis   . Pelvic relaxation   . Stroke [facility])    fully resolved TIA  . Vaginal pessary present     Past Surgical History:  Procedure Laterality Date  . BREAST LUMPECTOMY    . CATARACT EXTRACTION W/ INTRAOCULAR LENS  IMPLANT, BILATERAL Bilateral     There were no vitals filed for this visit.  Subjective Assessment - 04/13/19 1057    Subjective  Knee is feeling good.  I can tell my knee is getting better and looks good too.    Pertinent History  Lt TKA    Limitations  Walking;House hold activities    Patient Stated Goals  not use the cane, able to get upstairs to bedroom    Currently in Pain?  No/denies    Pain Onset  More than a month ago    Pain Relieving Factors  ice                       [facility] Adult PT Treatment/Exercise - 04/13/19 0001      Exercises   Exercises  Knee/Hip;Ankle      Knee/Hip Exercises: Aerobic   Nustep  L3 x 10', PT present to discuss progress  and monitor      Knee/Hip Exercises: Machines for Strengthening   Cybex Leg Press  Seat 6 Bil 60# 15x2, LTLE 30# 10x 2    with ball between knees for alignment     Knee/Hip Exercises: Standing   Heel Raises  Both;20 reps   on black foam - one finger support, close supervision     Knee/Hip Exercises: Seated   Long Arc Quad  Strengthening;Both;2 sets;15 reps;Weights    Long Arc Quad Weight  4 lbs.    Long [employer] Limitations  PT cued Pt to pause in LAQ for control [employer]  Strengthening;Both;20 reps;Weights;2 sets    [employer]  4 lbs.    Hamstring Curl  Strengthening;Both;20 reps    Hamstring Limitations  blue band around ankles    Sit to Sand  15 reps;without UE support   hip abd into blue band to prevent valgus     Modalities   Modalities  Vasopneumatic      Vasopneumatic   Number Minutes Vasopneumatic   10 minutes  Vasopnuematic Location   Knee   Lt   Vasopneumatic Pressure  High    Vasopneumatic Temperature   3 flakes             PT Education - 04/13/19 1134    Education Details  Access Code: [email or web address]    Person(s) Educated  Patient    Methods  Explanation;Demonstration    Comprehension  Verbalized understanding       PT Short Term Goals - 04/04/19 1207      PT SHORT TERM GOAL #1   Title  Pt will report she is back to at least 60% of her normal activites around the home    Time  4    Period  Weeks    Status  Achieved      PT SHORT TERM GOAL #2   Title  Pt will be ind with initial HEP    Time  4    Period  Weeks    Status  Achieved   daughter assists at times       PT Long Term Goals - 04/13/19 1146      PT LONG TERM GOAL #1   Title  Pt will demonstrate 5x sit to stand in < 13 sec.    Status  Achieved      PT LONG TERM GOAL #3   Title  Pt will be able to get up and down stairs to the second floor in her home so she can stay in her bedroom.    Status  On-going      PT LONG TERM GOAL #4   Title  Pt will report she is able to  manage her pain enough so that she can return to walking around the store for at least 30 minutes at a time.    Status  Achieved      PT LONG TERM GOAL #5   Title  Pt will be ind with advanced HEP    Baseline  progressing exercises    Status  On-going            Plan - 04/13/19 1143    Clinical Impression Statement  Pt arrives without knee pain.  She was able to perform increased reps and resistance without pain today.  Pt needed min cueing to slow LAQs for improved control.  PT progressed sit to stand with UE support from armchair to without UEs and added hip abd blue band around knees to reduce tendency toward slight valgus.  Pt liked new hamstring curl with blue band around ankles and said it felt like it improved Lt knee flexion ROM and ease of movement.  "I am walking the best I've walked" on her way out of session.  PT updated HEP and gave blue band.  Pt requested max compression of vasopneumatic today but needed slight loosening of knee cuff at max compression.  Monitor compression next visit.  Continue POC with careful monitoring of response to progression.    Comorbidities  Lt TKA    Rehab Potential  Excellent    PT Frequency  2x / week    PT Duration  8 weeks    PT Treatment/Interventions  ADLs/Self Care Home Management;Aquatic Therapy;Biofeedback;Cryotherapy;Electrical Stimulation;Moist Heat;Neuromuscular re-education;Passive range of motion;Dry needling;Manual techniques;Patient/family education;Therapeutic exercise;Therapeutic activities;Gait training    PT Next Visit Plan  PN next visit 10th visit, do FOTO, f/u on updated HEP, continue LE strength, control and ROM, vaso end of session max compression (monitor, may need mod compression)  PT Home Exercise Plan  Access Code: [email or web address] and Agree with Plan of Care  Patient       Patient will benefit from skilled therapeutic intervention in order to improve the following deficits and impairments:  Abnormal gait,  Pain, Increased fascial restricitons, Impaired flexibility, Decreased strength, Decreased range of motion, Difficulty walking, Decreased skin integrity, Increased edema  Visit Diagnosis: Stiffness of left knee, not elsewhere classified  Acute pain of left knee  Muscle weakness (generalized)     Problem List Patient Active Problem List   Diagnosis Date Noted  . Hyperlipidemia   . Sore throat   . Chronic atrial fibrillation ([place]) 08/01/2016  . Chronic systolic CHF (congestive heart failure) ([place]) 08/01/2016  . Left-sided weakness 03/13/2013  . TIA (transient ischemic attack) 03/13/2013  . HTN (hypertension) 03/13/2013  . Dyslipidemia 03/13/2013  . Depression 03/13/2013  . Breast cancer ([place]) 09/01/2012  . Deafness, sensorineural 07/31/2012  . Anxiety 03/17/2011  . Arthropathia 11/28/2007    Baruch Merl, PT 04/13/19 12:08 PM    [facility] [address], [place], [place], [address] Phone: [phone]   Fax:  [phone]  Name: ASYA ENGELKES MRN: [id number] Date of Birth: Jun 23, 1932

## 2019-04-14 ENCOUNTER — Emergency Department (HOSPITAL_BASED_OUTPATIENT_CLINIC_OR_DEPARTMENT_OTHER)
Admission: EM | Admit: 2019-04-14 | Discharge: 2019-04-14 | Disposition: A | Discharge status: Home / Self Care | Payer: Medicare Other | Attending: Emergency Medicine | Admitting: Emergency Medicine

## 2019-04-14 ENCOUNTER — Other Ambulatory Visit: Payer: Self-pay

## 2019-04-14 ENCOUNTER — Encounter (HOSPITAL_BASED_OUTPATIENT_CLINIC_OR_DEPARTMENT_OTHER): Payer: Self-pay | Admitting: *Deleted

## 2019-04-14 DIAGNOSIS — Z853 Personal history of malignant neoplasm of breast: Secondary | ICD-10-CM | POA: Insufficient documentation

## 2019-04-14 DIAGNOSIS — Z8673 Personal history of transient ischemic attack (TIA), and cerebral infarction without residual deficits: Secondary | ICD-10-CM | POA: Diagnosis not present

## 2019-04-14 DIAGNOSIS — I5022 Chronic systolic (congestive) heart failure: Secondary | ICD-10-CM | POA: Insufficient documentation

## 2019-04-14 DIAGNOSIS — R040 Epistaxis: Secondary | ICD-10-CM | POA: Diagnosis present

## 2019-04-14 DIAGNOSIS — I110 Hypertensive heart disease with heart failure: Secondary | ICD-10-CM | POA: Insufficient documentation

## 2019-04-14 DIAGNOSIS — Z79899 Other long term (current) drug therapy: Secondary | ICD-10-CM | POA: Diagnosis not present

## 2019-04-14 DIAGNOSIS — Z7901 Long term (current) use of anticoagulants: Secondary | ICD-10-CM | POA: Diagnosis not present

## 2019-04-14 MED ORDER — OXYMETAZOLINE HCL 0.05 % NA SOLN
1.0000 | Freq: Once | NASAL | Status: AC
  Administered 2019-04-14: 1 via NASAL

## 2019-04-14 MED ORDER — SILVER NITRATE-POT NITRATE 75-25 % EX MISC
1.0000 | Freq: Once | CUTANEOUS | Status: AC
  Administered 2019-04-14: 2 via TOPICAL
  Filled 2019-04-14: qty 1

## 2019-04-14 MED ORDER — OXYMETAZOLINE HCL 0.05 % NA SOLN
NASAL | Status: AC
  Filled 2019-04-14: qty 30

## 2019-04-14 MED ORDER — LIDOCAINE-EPINEPHRINE (PF) 1 %-1:200000 IJ SOLN
10.0000 mL | Freq: Once | INTRAMUSCULAR | Status: AC
  Administered 2019-04-14: 10 mL
  Filled 2019-04-14: qty 10

## 2019-04-14 MED ORDER — SILVER NITRATE-POT NITRATE 75-25 % EX MISC
CUTANEOUS | Status: AC
  Filled 2019-04-14: qty 1

## 2019-04-14 NOTE — ED Provider Notes (Signed)
[facility] EMERGENCY DEPARTMENT Provider Note   CSN: [id number] Arrival date & time: 04/14/19  [id number]    History   Chief Complaint Epistaxis  HPI Natalie Mendoza is a 85 y.o. female with past medical history who presents for evaluation of epistaxis. Afib anticoagulated with Eliquis. Intermittent epistaxis. Tolerating PO intake without difficulty. No dizziness, syncope, CP, SOB, fever, chills. No recent trauma, injury. No prior nasal surgeries      Epistaxis Location:  L nare Severity:  Moderate Duration:  2 hours Timing:  Intermittent Progression:  Waxing and waning Chronicity:  Recurrent Context: anticoagulants   Context: not aspirin use, not BiPAP, not bleeding disorder, not CPAP, not drug use, not elevation change, not foreign body, not home oxygen, not hypertension, not nose picking, not recent infection, not thrombocytopenia, not trauma and not weather change   Relieved by:  Applying pressure Worsened by:  Sneezing Associated symptoms: no blood in oropharynx, no congestion, no cough, no dizziness, no facial pain, no fever, no headaches, no sinus pain, no sneezing, no sore throat and no syncope   Risk factors: allergies and frequent nosebleeds   Risk factors: no alcohol use, no change in medication, no head and neck surgery, no head and neck tumor, no intranasal steroids, no liver disease, no radiation treatment, no recent chemotherapy, no recent nasal surgery and no sinus problems     Past Medical History:  Diagnosis Date  . Arrhythmia    Atrial Fib  . Atrial fibrillation ([place])   . Breast cancer ([place])   . CHF (congestive heart failure) ([place])   . Chronic systolic CHF (congestive heart failure) ([place])   . Depression   . Hearing impairment   . Hx of joint problems   . Hypertension   . Osteoarthritis   . Osteoporosis   . Pelvic relaxation   . Stroke [facility])    fully resolved TIA  . Vaginal pessary present     Patient Active Problem List   Diagnosis Date  Noted  . Hyperlipidemia   . Sore throat   . Chronic atrial fibrillation ([place]) 08/01/2016  . Chronic systolic CHF (congestive heart failure) ([place]) 08/01/2016  . Left-sided weakness 03/13/2013  . TIA (transient ischemic attack) 03/13/2013  . HTN (hypertension) 03/13/2013  . Dyslipidemia 03/13/2013  . Depression 03/13/2013  . Breast cancer ([place]) 09/01/2012  . Deafness, sensorineural 07/31/2012  . Anxiety 03/17/2011  . Arthropathia 11/28/2007    Past Surgical History:  Procedure Laterality Date  . BREAST LUMPECTOMY    . CATARACT EXTRACTION W/ INTRAOCULAR LENS  IMPLANT, BILATERAL Bilateral   . REPLACEMENT TOTAL KNEE     left     OB History    Gravida  2   Para  2   Term      Preterm      AB      Living        SAB      TAB      Ectopic      Multiple      Live Births               Home Medications    Prior to Admission medications   Medication Sig Start Date End Date Taking? Authorizing Provider  anastrozole (ARIMIDEX) 1 MG tablet Take 1 mg by mouth daily.   Yes [provider]  apixaban (ELIQUIS) 5 MG TABS tablet Take 1 tablet (5 mg total) by mouth 2 (two) times daily. 03/15/13  Yes Oti, Brantley Stage,  MD  bisoprolol (ZEBETA) 10 MG tablet Take 10 mg by mouth every evening.  12/05/14 04/14/19 Yes [provider]  diltiazem (CARDIZEM SR) 60 MG 12 hr capsule Take 1 capsule (60 mg total) by mouth every 12 (twelve) hours. 03/15/13  Yes Oti, Brantley Stage, MD  DULoxetine (CYMBALTA) 30 MG capsule Take 30 mg by mouth daily.   Yes [provider]  spironolactone (ALDACTONE) 25 MG tablet Take 25 mg by mouth daily.   Yes [provider]  valsartan (DIOVAN) 160 MG tablet Take 80 mg by mouth daily.    Yes [provider]    Family History Family History  Problem Relation Age of Onset  . Hypertension Mother   . Stroke Mother   . Ovarian cancer Maternal Grandmother     Social History Social History   Tobacco Use  . Smoking  status: Never Smoker  . Smokeless tobacco: Never Used  Substance Use Topics  . Alcohol use: No  . Drug use: No     Allergies   Patient has no known allergies.   Review of Systems Review of Systems  Constitutional: Negative.  Negative for fever.  HENT: Positive for nosebleeds. Negative for congestion, ear discharge, ear pain, facial swelling, hearing loss, mouth sores, postnasal drip, rhinorrhea, sinus pressure, sinus pain, sneezing, sore throat, tinnitus, trouble swallowing and voice change.   Eyes: Negative.   Respiratory: Negative.  Negative for cough.   Cardiovascular: Negative.  Negative for syncope.  Gastrointestinal: Negative.   Genitourinary: Negative.   Musculoskeletal: Negative.   Neurological: Negative for dizziness and headaches.  All other systems reviewed and are negative.    Physical Exam Updated Vital Signs BP 119/75 (BP Location: Right Arm)   Pulse 60   Temp 97.8 F (36.6 C) (Oral)   Resp 18   Ht 5\' 5"  (1.651 m)   Wt 70.3 kg   SpO2 97%   BMI 25.79 kg/m   Physical Exam Vitals signs and nursing note reviewed.  Constitutional:      General: She is not in acute distress.    Appearance: She is well-developed. She is not ill-appearing, toxic-appearing or diaphoretic.  HENT:     Head: Normocephalic and atraumatic.     Jaw: There is normal jaw occlusion.     Nose: No nasal deformity, septal deviation, signs of injury, laceration, nasal tenderness, mucosal edema, congestion or rhinorrhea.     Right Nostril: No foreign body, epistaxis or septal hematoma.     Left Nostril: Epistaxis present. No foreign body, septal hematoma or occlusion.     Comments: Anterior epistaxis to left medial wall nasal mucosa. Mild ozzing. No blood to right nares. No septal hematoma    Mouth/Throat:     Lips: Pink.     Mouth: Mucous membranes are moist.     Pharynx: Oropharynx is clear. Uvula midline.  Eyes:     Pupils: Pupils are equal, round, and reactive to light.  Neck:      Musculoskeletal: Full passive range of motion without pain and normal range of motion.     Comments: No neck stiffness, neck rigidity Cardiovascular:     Rate and Rhythm: Normal rate.     Pulses: Normal pulses.     Heart sounds: Normal heart sounds.  Pulmonary:     Effort: Pulmonary effort is normal. No respiratory distress.     Breath sounds: Normal breath sounds and air entry.     Comments: Speaks in full sentences without difficulty. Abdominal:  General: Bowel sounds are normal. There is no distension.     Palpations: Abdomen is soft.  Musculoskeletal: Normal range of motion.     Comments: Moves all 4 extremities without difficulty.  Skin:    General: Skin is warm and dry.     Comments: Brisk cap refill. No pallor  Neurological:     General: No focal deficit present.     Mental Status: She is alert.     Cranial Nerves: Cranial nerves are intact.     Sensory: Sensation is intact.     Motor: Motor function is intact.     Coordination: Coordination is intact.     Gait: Gait is intact.    ED Treatments / Results  Labs (all labs ordered are listed, but only abnormal results are displayed) Labs Reviewed - No data to display  EKG None  Radiology No results found.  Procedures .Epistaxis Management  Date/Time: 04/14/2019 9:51 AM Performed by: Nettie Elm, PA-C Authorized by: Nettie Elm, PA-C   Consent:    Consent obtained:  Verbal   Consent given by:  Patient   Risks discussed:  Bleeding, infection, nasal injury and pain   Alternatives discussed:  No treatment, delayed treatment, alternative treatment, observation and referral Anesthesia (see MAR for exact dosages):    Anesthesia method:  Topical application   Topical anesthetic:  Epinephrine and lidocaine gel Procedure details:    Treatment site:  L anterior   Treatment method:  Silver nitrate   Treatment complexity:  Extensive   Treatment episode: initial   Post-procedure details:     Assessment:  Bleeding stopped   Patient tolerance of procedure:  Tolerated well, no immediate complications   (including critical care time)  Medications Ordered in ED Medications  oxymetazoline (AFRIN) 0.05 % nasal spray 1 spray (1 spray Each Nare Given by Other 04/14/19 0927)  silver nitrate applicators applicator 1 Stick (2 Sticks Topical Given by Other 04/14/19 0929)  lidocaine-EPINEPHrine (XYLOCAINE-EPINEPHrine) 1 %-1:200000 (PF) injection 10 mL (10 mLs Other Given by Other 04/14/19 0930)    Initial Impression / Assessment and Plan / ED Course  I have reviewed the triage vital signs and the nursing notes.  Pertinent labs & imaging results that were available during my care of the patient were reviewed by me and considered in my medical decision making (see chart for details).  85 year old anticoagulated on Eliquis presents for epistaxis. Intermittent over the last 2 hours. No prior nasal surgeries. No syncope, dizziness. Protecting airway. Heart and lungs clear. VS stable. Do not think we need to check labs at this time. Epistaxis stopped after Afrin, anatomized lido with epi and cauterization. Observed  in ED without reoccurrence of epistaxis. Discussed strict return precautions and home measures with patient and daughter. Voice understanding and are agreeable to follow up with PCP. VS continued to be stable throughout ED stay.      Patient seen and evaluated by attending Dr. Rex Kras who agrees with above treatment, plan and disposition. Final Clinical Impressions(s) / ED Diagnoses   Final diagnoses:  Epistaxis    ED Discharge Orders    None       ,  A, PA-C 04/14/19 1023    Little, Wenda Overland, MD 04/17/19 1354

## 2019-04-14 NOTE — ED Triage Notes (Signed)
Nose bleed started at 6:30 am until 8:30.  Patient is on Eliquis.  Had the same episode last week but it stops in 30 minutes.

## 2019-04-14 NOTE — Discharge Instructions (Addendum)
May use Afrin and pressure if nose bleed reoccurs. If this dose not stop the nose bleed please return to the ED.

## 2019-04-14 NOTE — ED Notes (Signed)
ED Provider at bedside. 

## 2019-04-16 ENCOUNTER — Encounter: Payer: Self-pay | Admitting: Physical Therapy

## 2019-04-16 ENCOUNTER — Other Ambulatory Visit: Payer: Self-pay

## 2019-04-16 ENCOUNTER — Ambulatory Visit: Payer: Medicare Other | Admitting: Physical Therapy

## 2019-04-16 DIAGNOSIS — M25562 Pain in left knee: Secondary | ICD-10-CM

## 2019-04-16 DIAGNOSIS — M25662 Stiffness of left knee, not elsewhere classified: Secondary | ICD-10-CM

## 2019-04-16 DIAGNOSIS — M6281 Muscle weakness (generalized): Secondary | ICD-10-CM

## 2019-04-16 NOTE — Therapy (Signed)
[facility] [address] [place], [place], [address] Phone: [phone]   Fax:  [phone]  Physical Therapy Treatment  Patient Details  Name: Natalie Mendoza MRN: [id number] Date of Birth: June 17, 1933 Referring Provider (PT): Mardi Mainland, MD  Progress Note Reporting Period 03/26/2019 to 04/16/2019  See note below for Objective Data and Assessment of Progress/Goals.       Encounter Date: 04/16/2019  PT End of Session - 04/16/19 1242    Visit Number  10    Date for PT Re-Evaluation  05/21/19    Authorization Type  Medicare:  progress note at visit 20;  KX at visit 15    PT Start Time  1147    PT Stop Time  1240    PT Time Calculation (min)  53 min    Activity Tolerance  Patient tolerated treatment well;Other (comment)    Behavior During Therapy  --   shortness of breath today      Past Medical History:  Diagnosis Date  . Arrhythmia    Atrial Fib  . Atrial fibrillation ([place])   . Breast cancer ([place])   . CHF (congestive heart failure) ([place])   . Chronic systolic CHF (congestive heart failure) ([place])   . Depression   . Hearing impairment   . Hx of joint problems   . Hypertension   . Osteoarthritis   . Osteoporosis   . Pelvic relaxation   . Stroke [facility])    fully resolved TIA  . Vaginal pessary present     Past Surgical History:  Procedure Laterality Date  . BREAST LUMPECTOMY    . CATARACT EXTRACTION W/ INTRAOCULAR LENS  IMPLANT, BILATERAL Bilateral   . REPLACEMENT TOTAL KNEE     left    There were no vitals filed for this visit.  Subjective Assessment - 04/16/19 1150    Subjective  I think it's doing pretty good.  This new mask is thick and I can't breathe as well.  I didn't put in one of my hearing aides today.    Pertinent History  Lt TKA    Limitations  Walking;House hold activities    Currently in Pain?  No/denies    Pain Score  0-No pain    Pain Location  Knee    Pain  Orientation  Left    Pain Type  Surgical pain         [facility] PT Assessment - 04/16/19 0001      Observation/Other Assessments   Focus on Therapeutic Outcomes (FOTO)   40%      AROM   Left Knee Extension  3    Left Knee Flexion  116      Strength   Overall Strength Comments  quad 4-/5, HS 4/5       Ambulation/Gait   Gait Comments  short distances in the clinic without her cane                    [facility] Adult PT Treatment/Exercise - 04/16/19 0001      Knee/Hip Exercises: Stretches   Other Knee/Hip Stretches  2nd step knee flexion 10x    Other Knee/Hip Stretches  2nd step HS step 10x       Knee/Hip Exercises: Aerobic   Nustep  L1 5 min    while discussing status/progress; pt short of breath today      Knee/Hip Exercises: Machines for Strengthening   Cybex Leg Press  seat 6  65# 20x, 35# left only 30x       Knee/Hip Exercises: Standing   Hip Abduction  AROM;Right;10 reps    Abduction Limitations  WB on left     Forward Step Up  Left;15 reps;Hand Hold: 2;Step Height: 6"      Knee/Hip Exercises: Seated   Long Arc Quad  Strengthening;Both;2 sets;15 reps;Weights    Long Arc Quad Weight  4 lbs.    Hamstring Curl  Strengthening;Both;20 reps    Hamstring Limitations  blue band around ankles    Sit to Sand  15 reps;without UE support   hip abd into blue band to prevent valgus     Vasopneumatic   Number Minutes Vasopneumatic   10 minutes    Vasopnuematic Location   Knee   Lt, snug fit per patient request   Vasopneumatic Pressure  Medium    Vasopneumatic Temperature   3 flakes      Manual Therapy   Manual therapy comments  knee flexion in sitting and supine with mild overpressure 15x each    Joint Mobilization  Patella mobs, scar mobs    Myofascial Release  contract relax HS in supine 3x 5               PT Short Term Goals - 04/16/19 1252      PT SHORT TERM GOAL #1   Title  Pt will report she is back to at least 60% of her normal activites around  the home    Status  Achieved      PT SHORT TERM GOAL #2   Title  Pt will be ind with initial HEP    Status  Achieved      PT SHORT TERM GOAL #3   Title  Pt will be able to demonstrate Lt knee ext to 0 and flexion to >120 deg for improved function    Time  6    Period  Weeks    Status  On-going    Target Date  05/07/19        PT Long Term Goals - 04/16/19 1252      PT LONG TERM GOAL #1   Title  Pt will demonstrate 5x sit to stand in < 13 sec.    Status  Achieved      PT LONG TERM GOAL #2   Title  FOTO< or = to 45% limted    Status  Achieved      PT LONG TERM GOAL #3   Title  Pt will be able to get up and down stairs to the second floor in her home so she can stay in her bedroom.    Time  8    Period  Weeks    Status  On-going      PT LONG TERM GOAL #4   Title  Pt will report she is able to manage her pain enough so that she can return to walking around the store for at least 30 minutes at a time.    Status  Achieved      PT LONG TERM GOAL #5   Title  Pt will be ind with advanced HEP    Time  8    Period  Weeks    Status  On-going            Plan - 04/16/19 1244    Clinical Impression Statement  The patient has increased shortness of breath today which she attributes to her new mask, thicker  than what she has been wearing.  She requires more modifications and rest breaks than usual.  She also requires repetition of instructions secondary to difficulty hearing today.   Her active ROM has improved to 3-116 degrees.  Quad strength 4-/5, HS strength 4/5.  Minimal limp with short distance ambulation and she is able to walk within the clinic without her cane.  Her FOTO functional outcome score has improved significantly from 69% limitation to 40%.  She is progressing with rehab goals but would benefit from continued PT for a progression of strengthening, ROM in order for her to resume all functional activities.    Comorbidities  Lt TKA    Rehab Potential  Excellent    PT  Frequency  2x / week    PT Duration  8 weeks    PT Treatment/Interventions  ADLs/Self Care Home Management;Aquatic Therapy;Biofeedback;Cryotherapy;Electrical Stimulation;Moist Heat;Neuromuscular re-education;Passive range of motion;Dry needling;Manual techniques;Patient/family education;Therapeutic exercise;Therapeutic activities;Gait training    PT Next Visit Plan  f/u on updated HEP, continue LE strength, control and ROM, vasocompression    PT Home Exercise Plan  Access Code: [email or web address]       Patient will benefit from skilled therapeutic intervention in order to improve the following deficits and impairments:  Abnormal gait, Pain, Increased fascial restricitons, Impaired flexibility, Decreased strength, Decreased range of motion, Difficulty walking, Decreased skin integrity, Increased edema  Visit Diagnosis: Stiffness of left knee, not elsewhere classified  Acute pain of left knee  Muscle weakness (generalized)     Problem List Patient Active Problem List   Diagnosis Date Noted  . Hyperlipidemia   . Sore throat   . Chronic atrial fibrillation ([place]) 08/01/2016  . Chronic systolic CHF (congestive heart failure) ([place]) 08/01/2016  . Left-sided weakness 03/13/2013  . TIA (transient ischemic attack) 03/13/2013  . HTN (hypertension) 03/13/2013  . Dyslipidemia 03/13/2013  . Depression 03/13/2013  . Breast cancer ([place]) 09/01/2012  . Deafness, sensorineural 07/31/2012  . Anxiety 03/17/2011  . Arthropathia 11/28/2007   Ruben Im, PT 04/16/19 12:56 PM Phone: [phone] Fax: [phone] Alvera Singh 04/16/2019, 12:54 PM  [facility] [address] [place], [place], [address] Phone: [phone]   Fax:  [phone]  Name: Natalie Mendoza MRN: [id number] Date of Birth: 1933/02/11

## 2019-04-18 ENCOUNTER — Other Ambulatory Visit: Payer: Self-pay

## 2019-04-18 ENCOUNTER — Encounter: Payer: Self-pay | Admitting: Physical Therapy

## 2019-04-18 ENCOUNTER — Ambulatory Visit: Payer: Medicare Other | Admitting: Physical Therapy

## 2019-04-18 DIAGNOSIS — M25662 Stiffness of left knee, not elsewhere classified: Secondary | ICD-10-CM

## 2019-04-18 DIAGNOSIS — M6281 Muscle weakness (generalized): Secondary | ICD-10-CM

## 2019-04-18 DIAGNOSIS — M25562 Pain in left knee: Secondary | ICD-10-CM | POA: Diagnosis not present

## 2019-04-18 NOTE — Therapy (Signed)
[facility] [address], [place], [place], [address] Phone: [phone]   Fax:  [phone]  Physical Therapy Treatment  Patient Details  Name: Natalie Mendoza MRN: [id number] Date of Birth: [birth year] Referring Provider (PT): Mardi Mainland, MD   Encounter Date: 04/18/2019  PT End of Session - 04/18/19 1148    Visit Number  11    Date for PT Re-Evaluation  05/21/19    Authorization Type  Medicare:  progress note at visit 78;  KX at visit 15    PT Start Time  1143    PT Stop Time  1235    PT Time Calculation (min)  52 min    Activity Tolerance  Patient tolerated treatment well    Behavior During Therapy  [facility] for tasks assessed/performed       Past Medical History:  Diagnosis Date  . Arrhythmia    Atrial Fib  . Atrial fibrillation ([place])   . Breast cancer ([place])   . CHF (congestive heart failure) ([place])   . Chronic systolic CHF (congestive heart failure) ([place])   . Depression   . Hearing impairment   . Hx of joint problems   . Hypertension   . Osteoarthritis   . Osteoporosis   . Pelvic relaxation   . Stroke [facility])    fully resolved TIA  . Vaginal pessary present     Past Surgical History:  Procedure Laterality Date  . BREAST LUMPECTOMY    . CATARACT EXTRACTION W/ INTRAOCULAR LENS  IMPLANT, BILATERAL Bilateral   . REPLACEMENT TOTAL KNEE     left    There were no vitals filed for this visit.                    [facility] Adult PT Treatment/Exercise - 04/18/19 0001      Knee/Hip Exercises: Aerobic   Nustep  L3 x 10', PTA present to discuss progress and monitor      Knee/Hip Exercises: Machines for Strengthening   Cybex Leg Press  Seat 6: Bil LE 65# 20x, VC for control: 35# LTLE 20x       Knee/Hip Exercises: Standing   Hip Abduction  AROM;Stengthening;Both;2 sets;15 reps;Knee straight      Knee/Hip Exercises: Seated   Long Arc Quad  Strengthening;Left;2 sets;20 reps;Weights     Long Arc Quad Weight  4 lbs.    Long Arc Quad Limitations  VC to straighten knee all the way    Hamstring Curl  Strengthening;Both;20 reps   25x   Hamstring Limitations  blue band around ankles    Sit to Sand  15 reps;without UE support      Vasopneumatic   Number Minutes Vasopneumatic   10 minutes    Vasopnuematic Location   Knee   Lt, snug fit per patient request   Vasopneumatic Pressure  Medium    Vasopneumatic Temperature   3 flakes      Manual Therapy   Joint Mobilization  Patella mobs, scar mobs               PT Short Term Goals - 04/16/19 1252      PT SHORT TERM GOAL #1   Title  Pt will report she is back to at least 60% of her normal activites around the home    Status  Achieved      PT SHORT TERM GOAL #2   Title  Pt will be ind with initial HEP  Status  Achieved      PT SHORT TERM GOAL #3   Title  Pt will be able to demonstrate Lt knee ext to 0 and flexion to >120 deg for improved function    Time  6    Period  Weeks    Status  On-going    Target Date  05/07/19        PT Long Term Goals - 04/18/19 1204      PT LONG TERM GOAL #3   Title  Pt will be able to get up and down stairs to the second floor in her home so she can stay in her bedroom.    Time  8    Period  Weeks    Status  Achieved            Plan - 04/18/19 1150    Clinical Impression Statement  Pt arrives pain free and feeling very confident inher abilities. She can go upstairs to access her bedroom on the second floor, meeting long term goal. She does require verbal reminders to extend her knee fully when strengthening her quad. Some stickiness remains to her distal scar, edema very minimal but pt still ike the [employer] after her session.    Personal Factors and Comorbidities  Comorbidity 1    Comorbidities  Lt TKA    Stability/Clinical Decision Making  Stable/Uncomplicated    Rehab Potential  Excellent    PT Frequency  2x / week    PT Duration  8 weeks    PT  Treatment/Interventions  ADLs/Self Care Home Management;Aquatic Therapy;Biofeedback;Cryotherapy;Electrical Stimulation;Moist Heat;Neuromuscular re-education;Passive range of motion;Dry needling;Manual techniques;Patient/family education;Therapeutic exercise;Therapeutic activities;Gait training    PT Next Visit Plan  Knee strength, strength in TKE, hip strength for gait and balance.    PT Home Exercise Plan  Access Code: [email or web address] and Agree with Plan of Care  Patient       Patient will benefit from skilled therapeutic intervention in order to improve the following deficits and impairments:  Abnormal gait, Pain, Increased fascial restricitons, Impaired flexibility, Decreased strength, Decreased range of motion, Difficulty walking, Decreased skin integrity, Increased edema  Visit Diagnosis: Acute pain of left knee  Muscle weakness (generalized)  Stiffness of left knee, not elsewhere classified     Problem List Patient Active Problem List   Diagnosis Date Noted  . Hyperlipidemia   . Sore throat   . Chronic atrial fibrillation ([place]) 08/01/2016  . Chronic systolic CHF (congestive heart failure) ([place]) 08/01/2016  . Left-sided weakness 03/13/2013  . TIA (transient ischemic attack) 03/13/2013  . HTN (hypertension) 03/13/2013  . Dyslipidemia 03/13/2013  . Depression 03/13/2013  . Breast cancer ([place]) 09/01/2012  . Deafness, sensorineural 07/31/2012  . Anxiety 03/17/2011  . Arthropathia 11/28/2007    COCHRAN,JENNIFER, PTA 04/18/2019, 12:22 PM  [facility] [address], [place], [place], [address] Phone: [phone]   Fax:  [phone]  Name: Natalie Mendoza MRN: [id number] Date of Birth: [birth year]

## 2019-04-20 ENCOUNTER — Ambulatory Visit: Payer: Medicare Other | Admitting: Physical Therapy

## 2019-04-20 ENCOUNTER — Encounter: Payer: Self-pay | Admitting: Physical Therapy

## 2019-04-20 ENCOUNTER — Other Ambulatory Visit: Payer: Self-pay

## 2019-04-20 DIAGNOSIS — M25562 Pain in left knee: Secondary | ICD-10-CM

## 2019-04-20 DIAGNOSIS — M6281 Muscle weakness (generalized): Secondary | ICD-10-CM

## 2019-04-20 DIAGNOSIS — M25662 Stiffness of left knee, not elsewhere classified: Secondary | ICD-10-CM

## 2019-04-20 NOTE — Therapy (Signed)
[facility] [address], [place], [place], [address] Phone: [phone]   Fax:  [phone]  Physical Therapy Treatment  Patient Details  Name: Natalie Mendoza MRN: [id number] Date of Birth: 08-30-1932 Referring Provider (PT): Mardi Mainland, MD   Encounter Date: 04/20/2019  PT End of Session - 04/20/19 1109    Visit Number  12    Date for PT Re-Evaluation  05/21/19    Authorization Type  Medicare:  progress note at visit 19;  KX at visit 15    PT Start Time  1100    PT Stop Time  1153    PT Time Calculation (min)  53 min    Activity Tolerance  Patient tolerated treatment well    Behavior During Therapy  [facility] for tasks assessed/performed       Past Medical History:  Diagnosis Date  . Arrhythmia    Atrial Fib  . Atrial fibrillation ([place])   . Breast cancer ([place])   . CHF (congestive heart failure) ([place])   . Chronic systolic CHF (congestive heart failure) ([place])   . Depression   . Hearing impairment   . Hx of joint problems   . Hypertension   . Osteoarthritis   . Osteoporosis   . Pelvic relaxation   . Stroke [facility])    fully resolved TIA  . Vaginal pessary present     Past Surgical History:  Procedure Laterality Date  . BREAST LUMPECTOMY    . CATARACT EXTRACTION W/ INTRAOCULAR LENS  IMPLANT, BILATERAL Bilateral   . REPLACEMENT TOTAL KNEE     left    There were no vitals filed for this visit.  Subjective Assessment - 04/20/19 1108    Subjective  i am doing okay.    Patient Stated Goals  not use the cane, able to get upstairs to bedroom    Currently in Pain?  No/denies                       [facility] Adult PT Treatment/Exercise - 04/20/19 0001      Knee/Hip Exercises: Stretches   Active Hamstring Stretch  Left;30 seconds   sitting with overpressure for knee ext     Knee/Hip Exercises: Aerobic   Nustep  L3 x 10', PT present to discuss progress and monitor      Knee/Hip  Exercises: Machines for Strengthening   Cybex Leg Press  Seat 6: Bil LE 65# 20x, VC for control and increased knee extension: 35# LTLE 20x       Knee/Hip Exercises: Seated   Long Arc Quad  Strengthening;Left;2 sets;20 reps;Weights    Long Arc Quad Weight  4 lbs.    Long Arc Quad Limitations  VC to straighten knee all the way    Hamstring Curl  Strengthening;Both;2 sets;15 reps    Hamstring Limitations  blue band around ankles    Sit to Sand  15 reps;without UE support      Vasopneumatic   Number Minutes Vasopneumatic   10 minutes    Vasopnuematic Location   Knee    Vasopneumatic Pressure  Medium    Vasopneumatic Temperature   3 flakes      Manual Therapy   Joint Mobilization  Patella mobs, scar mobs               PT Short Term Goals - 04/16/19 1252      PT SHORT TERM GOAL #1   Title  Pt  will report she is back to at least 60% of her normal activites around the home    Status  Achieved      PT SHORT TERM GOAL #2   Title  Pt will be ind with initial HEP    Status  Achieved      PT SHORT TERM GOAL #3   Title  Pt will be able to demonstrate Lt knee ext to 0 and flexion to >120 deg for improved function    Time  6    Period  Weeks    Status  On-going    Target Date  05/07/19        PT Long Term Goals - 04/18/19 1204      PT LONG TERM GOAL #3   Title  Pt will be able to get up and down stairs to the second floor in her home so she can stay in her bedroom.    Time  8    Period  Weeks    Status  Achieved            Plan - 04/20/19 1155    Clinical Impression Statement  Pt did well with treatment today.  She was able to increase resistance with nustep but was cued to rest due to becoming very SOB.  Pt was back to baseline after several seconds and reported it was hard to breathe in the mask she had today.  Pt was able to increase resistance with leg press a small amount today.  She has good patellar mobilty in all directions.  Pt continues to have good healing  of incision.    PT Treatment/Interventions  ADLs/Self Care Home Management;Aquatic Therapy;Biofeedback;Cryotherapy;Electrical Stimulation;Moist Heat;Neuromuscular re-education;Passive range of motion;Dry needling;Manual techniques;Patient/family education;Therapeutic exercise;Therapeutic activities;Gait training    PT Next Visit Plan  Knee strength, strength in TKE, hip strength for gait and balance.    PT Home Exercise Plan  Access Code: [email or web address] and Agree with Plan of Care  Patient       Patient will benefit from skilled therapeutic intervention in order to improve the following deficits and impairments:  Abnormal gait, Pain, Increased fascial restricitons, Impaired flexibility, Decreased strength, Decreased range of motion, Difficulty walking, Decreased skin integrity, Increased edema  Visit Diagnosis: Acute pain of left knee  Muscle weakness (generalized)  Stiffness of left knee, not elsewhere classified     Problem List Patient Active Problem List   Diagnosis Date Noted  . Hyperlipidemia   . Sore throat   . Chronic atrial fibrillation ([place]) 08/01/2016  . Chronic systolic CHF (congestive heart failure) ([place]) 08/01/2016  . Left-sided weakness 03/13/2013  . TIA (transient ischemic attack) 03/13/2013  . HTN (hypertension) 03/13/2013  . Dyslipidemia 03/13/2013  . Depression 03/13/2013  . Breast cancer ([place]) 09/01/2012  . Deafness, sensorineural 07/31/2012  . Anxiety 03/17/2011  . Arthropathia 11/28/2007    Jule Ser, PT 04/20/2019, 12:03 PM  [facility] [address], [place], [place], [address] Phone: [phone]   Fax:  [phone]  Name: Natalie Mendoza MRN: [id number] Date of Birth: 01-Jan-1933

## 2019-04-23 ENCOUNTER — Ambulatory Visit: Payer: Medicare Other | Admitting: Physical Therapy

## 2019-04-25 ENCOUNTER — Ambulatory Visit: Payer: Medicare Other | Admitting: Physical Therapy

## 2019-04-27 ENCOUNTER — Encounter: Payer: Self-pay | Admitting: Physical Therapy

## 2019-04-27 ENCOUNTER — Ambulatory Visit: Payer: Medicare Other | Attending: Orthopedic Surgery | Admitting: Physical Therapy

## 2019-04-27 ENCOUNTER — Other Ambulatory Visit: Payer: Self-pay

## 2019-04-27 DIAGNOSIS — M25562 Pain in left knee: Secondary | ICD-10-CM | POA: Diagnosis present

## 2019-04-27 DIAGNOSIS — M6281 Muscle weakness (generalized): Secondary | ICD-10-CM | POA: Diagnosis present

## 2019-04-27 DIAGNOSIS — M25662 Stiffness of left knee, not elsewhere classified: Secondary | ICD-10-CM | POA: Insufficient documentation

## 2019-04-27 NOTE — Therapy (Signed)
[facility] [address] [place], [place], [address] Phone: [phone]   Fax:  [phone]  Physical Therapy Treatment  Patient Details  Name: Natalie Mendoza MRN: [id number] Date of Birth: 04-28-1933 Referring Provider (PT): Mardi Mainland, MD   Encounter Date: 04/27/2019  PT End of Session - 04/27/19 1109    Visit Number  13    Date for PT Re-Evaluation  05/21/19    Authorization Type  Medicare:  progress note at visit 60;  KX at visit 15    PT Start Time  1057    PT Stop Time  1153    PT Time Calculation (min)  56 min    Activity Tolerance  Patient tolerated treatment well    Behavior During Therapy  [facility] for tasks assessed/performed       Past Medical History:  Diagnosis Date  . Arrhythmia    Atrial Fib  . Atrial fibrillation ([place])   . Breast cancer ([place])   . CHF (congestive heart failure) ([place])   . Chronic systolic CHF (congestive heart failure) ([place])   . Depression   . Hearing impairment   . Hx of joint problems   . Hypertension   . Osteoarthritis   . Osteoporosis   . Pelvic relaxation   . Stroke [facility])    fully resolved TIA  . Vaginal pessary present     Past Surgical History:  Procedure Laterality Date  . BREAST LUMPECTOMY    . CATARACT EXTRACTION W/ INTRAOCULAR LENS  IMPLANT, BILATERAL Bilateral   . REPLACEMENT TOTAL KNEE     left    There were no vitals filed for this visit.  Subjective Assessment - 04/27/19 1107    Subjective  I am tired today    Currently in Pain?  No/denies                       [facility] Adult PT Treatment/Exercise - 04/27/19 0001      Knee/Hip Exercises: Aerobic   Stationary Bike  L2 x 4 min; L1 x 2 min - warm up   PT present for status update     Knee/Hip Exercises: Machines for Strengthening   Cybex Leg Press  Seat 6: Bil LE 70# 20x, VC for control and increased knee extension: 35# LTLE 20x       Knee/Hip Exercises: Standing    Rebounder  3 ways weight shifting - 1 min each       Knee/Hip Exercises: Seated   Long Arc Quad  Strengthening;Left;2 sets;20 reps;Weights    Long Arc Quad Limitations  VC to straighten knee all the way    Hamstring Curl  Strengthening;Both;2 sets;15 reps      Knee/Hip Exercises: Supine   Quad Sets  Strengthening;Left;10 reps   5sec   Short Arc [employer]  Strengthening;Left;2 sets;15 reps    Short Arc Quad Sets Limitations  5#    Straight Leg Raises  Strengthening;Left;20 reps    Straight Leg Raises Limitations  3#      Vasopneumatic   Number Minutes Vasopneumatic   10 minutes    Vasopnuematic Location   Knee    Vasopneumatic Pressure  Medium    Vasopneumatic Temperature   3 flakes      Manual Therapy   Manual Therapy  Passive ROM    Passive ROM  knee extension 5 x 10 sec hold  PT Short Term Goals - 04/16/19 1252      PT SHORT TERM GOAL #1   Title  Pt will report she is back to at least 60% of her normal activites around the home    Status  Achieved      PT SHORT TERM GOAL #2   Title  Pt will be ind with initial HEP    Status  Achieved      PT SHORT TERM GOAL #3   Title  Pt will be able to demonstrate Lt knee ext to 0 and flexion to >120 deg for improved function    Time  6    Period  Weeks    Status  On-going    Target Date  05/07/19        PT Long Term Goals - 04/18/19 1204      PT LONG TERM GOAL #3   Title  Pt will be able to get up and down stairs to the second floor in her home so she can stay in her bedroom.    Time  8    Period  Weeks    Status  Achieved            Plan - 04/27/19 1142    Clinical Impression Statement  Pt continues to need cues to keep Lt LE straight when performing exercises.  She has springy end feel with PROM knee extension, but lacks quad strength.  Pt was able to increase resistance on leg press and add weight to straight leg raise.  She has increased knee extension lag as she fatigues.  Pt will continue  to benefit from skilled PT for improved quad strength.    PT Treatment/Interventions  ADLs/Self Care Home Management;Aquatic Therapy;Biofeedback;Cryotherapy;Electrical Stimulation;Moist Heat;Neuromuscular re-education;Passive range of motion;Dry needling;Manual techniques;Patient/family education;Therapeutic exercise;Therapeutic activities;Gait training    PT Next Visit Plan  Knee strength, strength in TKE, hip strength for gait and balance.    PT Home Exercise Plan  Access Code: [email or web address]    Recommended Other Services  cert signed    Consulted and Agree with Plan of Care  Patient       Patient will benefit from skilled therapeutic intervention in order to improve the following deficits and impairments:  Abnormal gait, Pain, Increased fascial restricitons, Impaired flexibility, Decreased strength, Decreased range of motion, Difficulty walking, Decreased skin integrity, Increased edema  Visit Diagnosis: Acute pain of left knee  Muscle weakness (generalized)  Stiffness of left knee, not elsewhere classified     Problem List Patient Active Problem List   Diagnosis Date Noted  . Hyperlipidemia   . Sore throat   . Chronic atrial fibrillation ([place]) 08/01/2016  . Chronic systolic CHF (congestive heart failure) ([place]) 08/01/2016  . Left-sided weakness 03/13/2013  . TIA (transient ischemic attack) 03/13/2013  . HTN (hypertension) 03/13/2013  . Dyslipidemia 03/13/2013  . Depression 03/13/2013  . Breast cancer ([place]) 09/01/2012  . Deafness, sensorineural 07/31/2012  . Anxiety 03/17/2011  . Arthropathia 11/28/2007    Jule Ser, PT 04/27/2019, 11:51 AM  [facility] [address] [place], [place], [address] Phone: [phone]   Fax:  [phone]  Name: Natalie Mendoza MRN: [id number] Date of Birth: [birth year]

## 2019-04-30 ENCOUNTER — Ambulatory Visit: Payer: Medicare Other | Admitting: Physical Therapy

## 2019-04-30 ENCOUNTER — Other Ambulatory Visit: Payer: Self-pay

## 2019-04-30 ENCOUNTER — Encounter: Payer: Self-pay | Admitting: Physical Therapy

## 2019-04-30 DIAGNOSIS — M25562 Pain in left knee: Secondary | ICD-10-CM

## 2019-04-30 DIAGNOSIS — M6281 Muscle weakness (generalized): Secondary | ICD-10-CM

## 2019-04-30 DIAGNOSIS — M25662 Stiffness of left knee, not elsewhere classified: Secondary | ICD-10-CM

## 2019-04-30 NOTE — Therapy (Signed)
[facility] [address] [place], [place], [address] Phone: [phone]   Fax:  [phone]  Physical Therapy Treatment  Patient Details  Name: Natalie Mendoza MRN: [id number] Date of Birth: [birth year] Referring Provider (PT): Mardi Mainland, MD   Encounter Date: 04/30/2019  PT End of Session - 04/30/19 1101    Visit Number  14    Date for PT Re-Evaluation  05/21/19    Authorization Type  Medicare:  progress note at visit 96;  KX at visit 15    PT Start Time  1100    PT Stop Time  1150    PT Time Calculation (min)  50 min    Activity Tolerance  Patient tolerated treatment well    Behavior During Therapy  [facility] for tasks assessed/performed       Past Medical History:  Diagnosis Date  . Arrhythmia    Atrial Fib  . Atrial fibrillation ([place])   . Breast cancer ([place])   . CHF (congestive heart failure) ([place])   . Chronic systolic CHF (congestive heart failure) ([place])   . Depression   . Hearing impairment   . Hx of joint problems   . Hypertension   . Osteoarthritis   . Osteoporosis   . Pelvic relaxation   . Stroke [facility])    fully resolved TIA  . Vaginal pessary present     Past Surgical History:  Procedure Laterality Date  . BREAST LUMPECTOMY    . CATARACT EXTRACTION W/ INTRAOCULAR LENS  IMPLANT, BILATERAL Bilateral   . REPLACEMENT TOTAL KNEE     left    There were no vitals filed for this visit.  Subjective Assessment - 04/30/19 1104    Subjective  Pt denies pain.  She does seem to struggle to breathe a little when wearing the mask during the warm up.    Patient Stated Goals  not use the cane, able to get upstairs to bedroom    Currently in Pain?  No/denies                       [facility] Adult PT Treatment/Exercise - 04/30/19 0001      Knee/Hip Exercises: Aerobic   Nustep  L3 x 10', PT present to discuss progress and monitor      Knee/Hip Exercises: Machines for Strengthening    Cybex Leg Press  Seat 6: Bil LE 75# 20x, VC for control and increased knee extension: 40# LTLE 20x       Knee/Hip Exercises: Standing   Lateral Step Up  Left;10 reps;1 set    Forward Step Up  Left;Hand Hold: 2;Step Height: 6";2 sets;10 reps      Knee/Hip Exercises: Seated   Long Arc Quad  Strengthening;Left;2 sets;20 reps;Weights    Long Arc Quad Weight  5 lbs.    Long Arc Quad Limitations  VC to straighten knee all the way    Clamshell with TheraBand  Blue   20x single leg; 20x bilat   Hamstring Curl  Strengthening;Both;2 sets;15 reps    Hamstring Limitations  blue band around ankles    Sit to Sand  15 reps;without UE support      Vasopneumatic   Number Minutes Vasopneumatic   10 minutes    Vasopnuematic Location   Knee    Vasopneumatic Pressure  Medium    Vasopneumatic Temperature   3 flakes      Manual Therapy   Soft tissue mobilization  Lt quad x 2 min - patella moving well               PT Short Term Goals - 04/16/19 1252      PT SHORT TERM GOAL #1   Title  Pt will report she is back to at least 60% of her normal activites around the home    Status  Achieved      PT SHORT TERM GOAL #2   Title  Pt will be ind with initial HEP    Status  Achieved      PT SHORT TERM GOAL #3   Title  Pt will be able to demonstrate Lt knee ext to 0 and flexion to >120 deg for improved function    Time  6    Period  Weeks    Status  On-going    Target Date  05/07/19        PT Long Term Goals - 04/30/19 1144      PT LONG TERM GOAL #1   Title  Pt will demonstrate 5x sit to stand in < 13 sec.    Status  Achieved      PT LONG TERM GOAL #2   Title  FOTO< or = to 45% limted    Status  Achieved      PT LONG TERM GOAL #3   Title  Pt will be able to get up and down stairs to the second floor in her home so she can stay in her bedroom.    Status  Achieved      PT LONG TERM GOAL #4   Title  Pt will report she is able to manage her pain enough so that she can return to  walking around the store for at least 30 minutes at a time.    Status  Achieved      PT LONG TERM GOAL #5   Title  Pt will be ind with advanced HEP    Status  On-going            Plan - 04/30/19 1141    Clinical Impression Statement  Pt doing well with quad strengthening.  Pt was able to increase resistance of LAQ and leg press.  She tolerated more steps today.  She needed cues for breaks throughout due to getting noticeably out of breath.  Pt feels it is hard to breathe in mask.  Pt will benefit from skilled PT to work on quad strength and gait and transition home with HEP.    PT Treatment/Interventions  ADLs/Self Care Home Management;Aquatic Therapy;Biofeedback;Cryotherapy;Electrical Stimulation;Moist Heat;Neuromuscular re-education;Passive range of motion;Dry needling;Manual techniques;Patient/family education;Therapeutic exercise;Therapeutic activities;Gait training    PT Next Visit Plan  Knee strength, strength in TKE, hip strength for gait and balance, prepare for discharge in 2-3 more visits, review HEP and update as needed    PT Home Exercise Plan  Access Code: [email or web address]    Consulted and Agree with Plan of Care  Patient       Patient will benefit from skilled therapeutic intervention in order to improve the following deficits and impairments:  Abnormal gait, Pain, Increased fascial restricitons, Impaired flexibility, Decreased strength, Decreased range of motion, Difficulty walking, Decreased skin integrity, Increased edema  Visit Diagnosis: Acute pain of left knee  Muscle weakness (generalized)  Stiffness of left knee, not elsewhere classified     Problem List Patient Active Problem List   Diagnosis Date Noted  . Hyperlipidemia   . Sore throat   .  Chronic atrial fibrillation ([place]) 08/01/2016  . Chronic systolic CHF (congestive heart failure) ([place]) 08/01/2016  . Left-sided weakness 03/13/2013  . TIA (transient ischemic attack) 03/13/2013  . HTN (hypertension)  03/13/2013  . Dyslipidemia 03/13/2013  . Depression 03/13/2013  . Breast cancer ([place]) 09/01/2012  . Deafness, sensorineural 07/31/2012  . Anxiety 03/17/2011  . Arthropathia 11/28/2007    Jule Ser , PT 04/30/2019, 11:46 AM  [facility] [address] [place], [place], [address] Phone: [phone]   Fax:  [phone]  Name: Natalie Mendoza MRN: [id number] Date of Birth: 1932/12/26

## 2019-05-02 ENCOUNTER — Encounter: Payer: Self-pay | Admitting: Physical Therapy

## 2019-05-02 ENCOUNTER — Ambulatory Visit: Payer: Medicare Other | Admitting: Physical Therapy

## 2019-05-02 ENCOUNTER — Other Ambulatory Visit: Payer: Self-pay

## 2019-05-02 DIAGNOSIS — M25562 Pain in left knee: Secondary | ICD-10-CM | POA: Diagnosis not present

## 2019-05-02 DIAGNOSIS — M25662 Stiffness of left knee, not elsewhere classified: Secondary | ICD-10-CM

## 2019-05-02 DIAGNOSIS — M6281 Muscle weakness (generalized): Secondary | ICD-10-CM

## 2019-05-02 NOTE — Therapy (Signed)
[facility] [address], [place], [place], [address] Phone: [phone]   Fax:  [phone]  Physical Therapy Treatment  Patient Details  Name: Natalie Mendoza MRN: [id number] Date of Birth: March 18, 1933 Referring Provider (PT): Mardi Mainland, MD   Encounter Date: 05/02/2019  PT End of Session - 05/02/19 1531    Visit Number  15    Date for PT Re-Evaluation  05/21/19    Authorization Type  Medicare:  progress note at visit 102;  KX at visit 15    PT Start Time  1529    PT Stop Time  1610    PT Time Calculation (min)  41 min    Activity Tolerance  Patient tolerated treatment well    Behavior During Therapy  [facility] for tasks assessed/performed       Past Medical History:  Diagnosis Date  . Arrhythmia    Atrial Fib  . Atrial fibrillation ([place])   . Breast cancer ([place])   . CHF (congestive heart failure) ([place])   . Chronic systolic CHF (congestive heart failure) ([place])   . Depression   . Hearing impairment   . Hx of joint problems   . Hypertension   . Osteoarthritis   . Osteoporosis   . Pelvic relaxation   . Stroke [facility])    fully resolved TIA  . Vaginal pessary present     Past Surgical History:  Procedure Laterality Date  . BREAST LUMPECTOMY    . CATARACT EXTRACTION W/ INTRAOCULAR LENS  IMPLANT, BILATERAL Bilateral   . REPLACEMENT TOTAL KNEE     left    There were no vitals filed for this visit.  Subjective Assessment - 05/02/19 1532    Subjective  I have had a busy day. My knee is great.    Pertinent History  Lt TKA    Currently in Pain?  No/denies    Multiple Pain Sites  No                       [facility] Adult PT Treatment/Exercise - 05/02/19 0001      Knee/Hip Exercises: Aerobic   Nustep  L3 x 10', PTA  present to discuss progress and monitor      Knee/Hip Exercises: Machines for Strengthening   Cybex Leg Press  Seat 6: Bil LE 75# 20x, VC for control and increased knee  extension: 40# LTLE 20x       Knee/Hip Exercises: Standing   Lateral Step Up  Left;10 reps;1 set    Forward Step Up  Left;Hand Hold: 2;Step Height: 6";2 sets;10 reps      Knee/Hip Exercises: Seated   Long Arc Quad  Strengthening;Left;2 sets;20 reps;Weights    Long Arc Quad Weight  5 lbs.    Long Arc Quad Limitations  VC to straighten knee all the way    Clamshell with TheraBand  Blue   20x single leg; 20x bilat   Hamstring Curl  Strengthening;Both;2 sets;15 reps    Hamstring Limitations  blue band around ankles      Vasopneumatic   Number Minutes Vasopneumatic   10 minutes    Vasopnuematic Location   Knee    Vasopneumatic Pressure  Medium    Vasopneumatic Temperature   3 flakes               PT Short Term Goals - 04/16/19 1252      PT SHORT TERM GOAL #1   Title  Pt will report she is back to at least 60% of her normal activites around the home    Status  Achieved      PT SHORT TERM GOAL #2   Title  Pt will be ind with initial HEP    Status  Achieved      PT SHORT TERM GOAL #3   Title  Pt will be able to demonstrate Lt knee ext to 0 and flexion to >120 deg for improved function    Time  6    Period  Weeks    Status  On-going    Target Date  05/07/19        PT Long Term Goals - 04/30/19 1144      PT LONG TERM GOAL #1   Title  Pt will demonstrate 5x sit to stand in < 13 sec.    Status  Achieved      PT LONG TERM GOAL #2   Title  FOTO< or = to 45% limted    Status  Achieved      PT LONG TERM GOAL #3   Title  Pt will be able to get up and down stairs to the second floor in her home so she can stay in her bedroom.    Status  Achieved      PT LONG TERM GOAL #4   Title  Pt will report she is able to manage her pain enough so that she can return to walking around the store for at least 30 minutes at a time.    Status  Achieved      PT LONG TERM GOAL #5   Title  Pt will be ind with advanced HEP    Status  On-going            Plan - 05/02/19 1557     Clinical Impression Statement  Pt arrives without complaints. She does continue to have breathing problems bc of the mask. Despite this pt gives 110% effort and regularly needs VC to slow down and focus on control more than going fast. No knee pain throughout session.    Personal Factors and Comorbidities  Comorbidity 1    Comorbidities  Lt TKA    Stability/Clinical Decision Making  Stable/Uncomplicated    Rehab Potential  Excellent    PT Frequency  2x / week    PT Duration  8 weeks    PT Treatment/Interventions  ADLs/Self Care Home Management;Aquatic Therapy;Biofeedback;Cryotherapy;Electrical Stimulation;Moist Heat;Neuromuscular re-education;Passive range of motion;Dry needling;Manual techniques;Patient/family education;Therapeutic exercise;Therapeutic activities;Gait training    PT Next Visit Plan  DC in next 1-2 visits per PT POC    PT Home Exercise Plan  Access Code: [email or web address] and Agree with Plan of Care  Patient       Patient will benefit from skilled therapeutic intervention in order to improve the following deficits and impairments:  Abnormal gait, Pain, Increased fascial restricitons, Impaired flexibility, Decreased strength, Decreased range of motion, Difficulty walking, Decreased skin integrity, Increased edema  Visit Diagnosis: Acute pain of left knee  Muscle weakness (generalized)  Stiffness of left knee, not elsewhere classified     Problem List Patient Active Problem List   Diagnosis Date Noted  . Hyperlipidemia   . Sore throat   . Chronic atrial fibrillation ([place]) 08/01/2016  . Chronic systolic CHF (congestive heart failure) ([place]) 08/01/2016  . Left-sided weakness 03/13/2013  . TIA (transient ischemic attack) 03/13/2013  . HTN (hypertension) 03/13/2013  . Dyslipidemia 03/13/2013  .  Depression 03/13/2013  . Breast cancer ([place]) 09/01/2012  . Deafness, sensorineural 07/31/2012  . Anxiety 03/17/2011  . Arthropathia 11/28/2007    Kymia Simi,  PTA 05/02/2019, 4:02 PM  [facility] [address] [place], [place], [address] Phone: [phone]   Fax:  [phone]  Name: Natalie Mendoza MRN: [id number] Date of Birth: 1932/06/28

## 2019-05-08 ENCOUNTER — Other Ambulatory Visit: Payer: Self-pay

## 2019-05-08 ENCOUNTER — Ambulatory Visit: Payer: Medicare Other | Admitting: Physical Therapy

## 2019-05-08 ENCOUNTER — Encounter: Payer: Self-pay | Admitting: Physical Therapy

## 2019-05-08 DIAGNOSIS — M25662 Stiffness of left knee, not elsewhere classified: Secondary | ICD-10-CM

## 2019-05-08 DIAGNOSIS — M6281 Muscle weakness (generalized): Secondary | ICD-10-CM

## 2019-05-08 DIAGNOSIS — M25562 Pain in left knee: Secondary | ICD-10-CM

## 2019-05-08 NOTE — Therapy (Signed)
[facility] [address] [place], [place], [address] Phone: [phone]   Fax:  [phone]  Physical Therapy Treatment  Patient Details  Name: Natalie Mendoza MRN: [id number] Date of Birth: [birth year] Referring Provider (PT): Mardi Mainland, MD   Encounter Date: 05/08/2019  PT End of Session - 05/08/19 1406    Visit Number  16    Number of Visits  20    Date for PT Re-Evaluation  05/21/19    Authorization Type  Medicare:  progress note at visit 76;  KX at visit 15    PT Start Time  1359    PT Stop Time  1450    PT Time Calculation (min)  51 min    Activity Tolerance  Patient tolerated treatment well    Behavior During Therapy  [facility] for tasks assessed/performed       Past Medical History:  Diagnosis Date  . Arrhythmia    Atrial Fib  . Atrial fibrillation ([place])   . Breast cancer ([place])   . CHF (congestive heart failure) ([place])   . Chronic systolic CHF (congestive heart failure) ([place])   . Depression   . Hearing impairment   . Hx of joint problems   . Hypertension   . Osteoarthritis   . Osteoporosis   . Pelvic relaxation   . Stroke [facility])    fully resolved TIA  . Vaginal pessary present     Past Surgical History:  Procedure Laterality Date  . BREAST LUMPECTOMY    . CATARACT EXTRACTION W/ INTRAOCULAR LENS  IMPLANT, BILATERAL Bilateral   . REPLACEMENT TOTAL KNEE     left    There were no vitals filed for this visit.  Subjective Assessment - 05/08/19 1403    Subjective  My knee is feeling pretty good.  I am walking without my cane and it easy to forget that my leg is not as strong as it was so I still have to be careful.  I stumbled a few times because I was moving faster.    Patient Stated Goals  not use the cane, able to get upstairs to bedroom    Currently in Pain?  No/denies                       [facility] Adult PT Treatment/Exercise - 05/08/19 0001      Knee/Hip  Exercises: Aerobic   Nustep  L3 x 10', PTA  present to discuss progress and monitor      Knee/Hip Exercises: Machines for Strengthening   Cybex Leg Press  Seat 6: Bil LE 75# 20x, VC for control and increased knee extension: 45# LTLE 3x10       Knee/Hip Exercises: Standing   Rebounder  3 ways weight shifting - 1 min each       Knee/Hip Exercises: Seated   Long Arc Quad  Strengthening;Left;2 sets;20 reps;Weights    Long Arc Quad Weight  5 lbs.    Long Arc Quad Limitations  VC to straighten knee all the way    Clamshell with TheraBand  Blue   20x single leg; 20x bilat   Hamstring Curl  Strengthening;Both;2 sets;15 reps    Hamstring Limitations  blue band around ankles      Vasopneumatic   Number Minutes Vasopneumatic   10 minutes  (Pended)     Vasopnuematic Location   Knee  (Pended)     Vasopneumatic Pressure  Medium  (Pended)  Vasopneumatic Temperature   3 flakes  (Pended)         sit to stand x 15 with blue band around thighs - cues to shift weight onto Lt LE       PT Short Term Goals - 04/16/19 1252      PT SHORT TERM GOAL #1   Title  Pt will report she is back to at least 60% of her normal activites around the home    Status  Achieved      PT SHORT TERM GOAL #2   Title  Pt will be ind with initial HEP    Status  Achieved      PT SHORT TERM GOAL #3   Title  Pt will be able to demonstrate Lt knee ext to 0 and flexion to >120 deg for improved function    Time  6    Period  Weeks    Status  On-going    Target Date  05/07/19        PT Long Term Goals - 05/08/19 1421      PT LONG TERM GOAL #5   Title  Pt will be ind with advanced HEP    Status  On-going            Plan - 05/08/19 1412    Clinical Impression Statement  Pt did well with exercises today. She verbalizes some concern on what to do after Pt since she is unable to go to her normal fitness facility.  Pt will benefit from as much help from her daughter as possible due to patient being  challenged with some memory issues.  Pt will benefit from 1-2 more visits to help with transition to HE.    PT Treatment/Interventions  ADLs/Self Care Home Management;Aquatic Therapy;Biofeedback;Cryotherapy;Electrical Stimulation;Moist Heat;Neuromuscular re-education;Passive range of motion;Dry needling;Manual techniques;Patient/family education;Therapeutic exercise;Therapeutic activities;Gait training    PT Next Visit Plan  DC in next 1-2 visits per PT POC    PT Home Exercise Plan  Access Code: [email or web address] and Agree with Plan of Care  Patient       Patient will benefit from skilled therapeutic intervention in order to improve the following deficits and impairments:  Abnormal gait, Pain, Increased fascial restricitons, Impaired flexibility, Decreased strength, Decreased range of motion, Difficulty walking, Decreased skin integrity, Increased edema  Visit Diagnosis: Acute pain of left knee  Muscle weakness (generalized)  Stiffness of left knee, not elsewhere classified     Problem List Patient Active Problem List   Diagnosis Date Noted  . Hyperlipidemia   . Sore throat   . Chronic atrial fibrillation ([place]) 08/01/2016  . Chronic systolic CHF (congestive heart failure) ([place]) 08/01/2016  . Left-sided weakness 03/13/2013  . TIA (transient ischemic attack) 03/13/2013  . HTN (hypertension) 03/13/2013  . Dyslipidemia 03/13/2013  . Depression 03/13/2013  . Breast cancer ([place]) 09/01/2012  . Deafness, sensorineural 07/31/2012  . Anxiety 03/17/2011  . Arthropathia 11/28/2007    Jule Ser, PT 05/08/2019, 2:31 PM   [facility] [address], [place], [place], [address] Phone: [phone]   Fax:  [phone]  Name: Natalie Mendoza MRN: [id number] Date of Birth: Oct 23, 1932

## 2019-05-10 ENCOUNTER — Encounter: Payer: Self-pay | Admitting: Physical Therapy

## 2019-05-10 ENCOUNTER — Other Ambulatory Visit: Payer: Self-pay

## 2019-05-10 ENCOUNTER — Ambulatory Visit: Payer: Medicare Other | Admitting: Physical Therapy

## 2019-05-10 DIAGNOSIS — M6281 Muscle weakness (generalized): Secondary | ICD-10-CM

## 2019-05-10 DIAGNOSIS — M25562 Pain in left knee: Secondary | ICD-10-CM

## 2019-05-10 DIAGNOSIS — M25662 Stiffness of left knee, not elsewhere classified: Secondary | ICD-10-CM

## 2019-05-10 NOTE — Therapy (Signed)
[facility] [address] [place], [place], [address] Phone: [phone]   Fax:  [phone]  Physical Therapy Treatment  Patient Details  Name: Natalie Mendoza MRN: [id number] Date of Birth: [birth year] Referring Provider (PT): Mardi Mainland, MD   Encounter Date: 05/10/2019  PT End of Session - 05/10/19 1402    Visit Number  17    Number of Visits  20    Date for PT Re-Evaluation  05/21/19    Authorization Type  Medicare:  progress note at visit 21;  KX at visit 15    PT Start Time  1359    PT Stop Time  1450    PT Time Calculation (min)  51 min    Activity Tolerance  Patient tolerated treatment well    Behavior During Therapy  [facility] for tasks assessed/performed       Past Medical History:  Diagnosis Date  . Arrhythmia    Atrial Fib  . Atrial fibrillation ([place])   . Breast cancer ([place])   . CHF (congestive heart failure) ([place])   . Chronic systolic CHF (congestive heart failure) ([place])   . Depression   . Hearing impairment   . Hx of joint problems   . Hypertension   . Osteoarthritis   . Osteoporosis   . Pelvic relaxation   . Stroke [facility])    fully resolved TIA  . Vaginal pessary present     Past Surgical History:  Procedure Laterality Date  . BREAST LUMPECTOMY    . CATARACT EXTRACTION W/ INTRAOCULAR LENS  IMPLANT, BILATERAL Bilateral   . REPLACEMENT TOTAL KNEE     left    There were no vitals filed for this visit.  Subjective Assessment - 05/10/19 1403    Subjective  Pt states she worked in the house today.  She is doing well.    Patient Stated Goals  not use the cane, able to get upstairs to bedroom    Currently in Pain?  No/denies                       [facility] Adult PT Treatment/Exercise - 05/10/19 0001      Knee/Hip Exercises: Aerobic   Nustep  L3 x 10', PT  present to discuss progress and monitor      Knee/Hip Exercises: Machines for Strengthening   Cybex Leg  Press  Seat 6: Bil LE 90# 30x, VC for control and increased knee extension: 45# LTLE 3x10       Knee/Hip Exercises: Standing   Forward Step Up  Left;Hand Hold: 2;Step Height: 6";2 sets;10 reps    SLS  yellow band - hip abduction and flexion - 15x each way    Other Standing Knee Exercises  tandem standing - modified tandem then full tandem x 30 sec bilateral      Knee/Hip Exercises: Seated   Long Arc Quad  Strengthening;Left;2 sets;20 reps;Weights    Long Arc Quad Weight  5 lbs.    Long Arc Quad Limitations  VC to straighten knee all the way    Clamshell with TheraBand  Blue   20x single leg; 20x bilat   Hamstring Curl  Strengthening;Both;2 sets;15 reps    Hamstring Limitations  blue band around ankles      Vasopneumatic   Number Minutes Vasopneumatic   10 minutes    Vasopnuematic Location   Knee    Vasopneumatic Pressure  Medium    Vasopneumatic Temperature  3 flakes               PT Short Term Goals - 04/16/19 1252      PT SHORT TERM GOAL #1   Title  Pt will report she is back to at least 60% of her normal activites around the home    Status  Achieved      PT SHORT TERM GOAL #2   Title  Pt will be ind with initial HEP    Status  Achieved      PT SHORT TERM GOAL #3   Title  Pt will be able to demonstrate Lt knee ext to 0 and flexion to >120 deg for improved function    Time  6    Period  Weeks    Status  On-going    Target Date  05/07/19        PT Long Term Goals - 05/08/19 1421      PT LONG TERM GOAL #5   Title  Pt will be ind with advanced HEP    Status  On-going            Plan - 05/10/19 1409    Clinical Impression Statement  Pt continues to demonstrate progress.  She increased resistance with leg press.  Today's session added more stability exercises.  She will not come to PT next week and should be able to discharge after assess how well she does with HEP at next visit.  Pt will benefit from skilled PT for successful transition to HEP.    PT  Treatment/Interventions  ADLs/Self Care Home Management;Aquatic Therapy;Biofeedback;Cryotherapy;Electrical Stimulation;Moist Heat;Neuromuscular re-education;Passive range of motion;Dry needling;Manual techniques;Patient/family education;Therapeutic exercise;Therapeutic activities;Gait training    PT Next Visit Plan  DC next unless any change in status, assess how she is doing with HEP, talk to daughter to ensure she will continue to help with HEP at home    PT Home Exercise Plan  Access Code: [email or web address]    Consulted and Agree with Plan of Care  Patient       Patient will benefit from skilled therapeutic intervention in order to improve the following deficits and impairments:  Abnormal gait, Pain, Increased fascial restricitons, Impaired flexibility, Decreased strength, Decreased range of motion, Difficulty walking, Decreased skin integrity, Increased edema  Visit Diagnosis: Acute pain of left knee  Muscle weakness (generalized)  Stiffness of left knee, not elsewhere classified     Problem List Patient Active Problem List   Diagnosis Date Noted  . Hyperlipidemia   . Sore throat   . Chronic atrial fibrillation ([place]) 08/01/2016  . Chronic systolic CHF (congestive heart failure) ([place]) 08/01/2016  . Left-sided weakness 03/13/2013  . TIA (transient ischemic attack) 03/13/2013  . HTN (hypertension) 03/13/2013  . Dyslipidemia 03/13/2013  . Depression 03/13/2013  . Breast cancer ([place]) 09/01/2012  . Deafness, sensorineural 07/31/2012  . Anxiety 03/17/2011  . Arthropathia 11/28/2007    Natalie Mendoza, PT 05/10/2019, 2:46 PM  [facility] [address] [place], [place], [address] Phone: [phone]   Fax:  [phone]  Name: Natalie Mendoza MRN: [id number] Date of Birth: 1933/02/20

## 2019-05-21 ENCOUNTER — Other Ambulatory Visit: Payer: Self-pay

## 2019-05-21 ENCOUNTER — Ambulatory Visit: Payer: Medicare Other | Admitting: Physical Therapy

## 2019-05-21 DIAGNOSIS — M6281 Muscle weakness (generalized): Secondary | ICD-10-CM

## 2019-05-21 DIAGNOSIS — M25562 Pain in left knee: Secondary | ICD-10-CM

## 2019-05-21 DIAGNOSIS — M25662 Stiffness of left knee, not elsewhere classified: Secondary | ICD-10-CM

## 2019-05-21 NOTE — Patient Instructions (Signed)
Access Code: [email or web address]  URL: [email or web address]  Date: 05/21/2019  Prepared by: Jari Favre   Exercises  Sidelying Hip Abduction - 10 reps - 1 sets - 3x daily - 7x weekly  Sit to Stand with Resistance Around Legs - 10 reps - 3 sets - 1x daily - 7x weekly  Seated Long Arc Quad - 10 reps - 3 sets - 1x daily - 7x weekly  Forward Step Up - 10 reps - 3 sets - 1x daily - 7x weekly  Heel rises with counter support - 10 reps - 3 sets - 1x daily - 7x weekly  Seated Hamstring Curls with Resistance - 10 reps - 3 sets - 1x daily - 7x weekly  Standing Hip Abduction with Counter Support - 10 reps - 3 sets - 1x daily - 7x weekly  Standing Marching - 10 reps - 3 sets - 1x daily - 7x weekly  Standing Hip Extension with Counter Support - 10 reps - 3 sets - 1x daily - 7x weekly  Step Up - 10 reps - 3 sets - 1x daily - 7x weekly  Forward Step Down - 10 reps - 3 sets - 1x daily - 7x weekly  Supine Knee Flexion Self Mobilization - 10 reps - 3 sets - 10 sec hold - 1x daily - 7x weekly  Supine Knee Extension Stretch on Towel Roll - 10 reps - 3 sets - 10 sec hold - 1x daily - 7x weekly

## 2019-05-21 NOTE — Therapy (Signed)
[facility] [address] [place], [place], [address] Phone: [phone]   Fax:  [phone]  Physical Therapy Treatment  Patient Details  Name: Natalie Mendoza MRN: [id number] Date of Birth: 07-Nov-1932 Referring Provider (PT): Mardi Mainland, MD   Encounter Date: 05/21/2019  PT End of Session - 05/21/19 1412    Visit Number  18    Number of Visits  20    Date for PT Re-Evaluation  05/21/19    Authorization Type  Medicare:  progress note at visit 70;  KX at visit 15    PT Start Time  1400    PT Stop Time  1442    PT Time Calculation (min)  42 min    Activity Tolerance  Patient tolerated treatment well    Behavior During Therapy  [facility] for tasks assessed/performed       Past Medical History:  Diagnosis Date  . Arrhythmia    Atrial Fib  . Atrial fibrillation ([place])   . Breast cancer ([place])   . CHF (congestive heart failure) ([place])   . Chronic systolic CHF (congestive heart failure) ([place])   . Depression   . Hearing impairment   . Hx of joint problems   . Hypertension   . Osteoarthritis   . Osteoporosis   . Pelvic relaxation   . Stroke [facility])    fully resolved TIA  . Vaginal pessary present     Past Surgical History:  Procedure Laterality Date  . BREAST LUMPECTOMY    . CATARACT EXTRACTION W/ INTRAOCULAR LENS  IMPLANT, BILATERAL Bilateral   . REPLACEMENT TOTAL KNEE     left    There were no vitals filed for this visit.  Subjective Assessment - 05/21/19 1604    Subjective  Spoke with patient's daughter at the beginning of treatment about continueing HEP.  Her daughter is willing to bring her to a gym to see a personal trainer to continue to progress her strength and ROM.  Info will be emailed to daughter about exercise program    Patient is accompained by:  Family member   daughter   Currently in Pain?  No/denies         [facility] PT Assessment - 05/21/19 0001      AROM   Left Knee Extension   3    Left Knee Flexion  125   after stretching 5 x 5 sec hold     Strength   Overall Strength Comments  quad 4+/5, HS 5/5       Ambulation/Gait   Gait Comments  slight bend of Lt knee with slightly less heel strike on Lt side during gait                   [facility] Adult PT Treatment/Exercise - 05/21/19 0001      Knee/Hip Exercises: Aerobic   Nustep  L3 x 10', PT  present to discuss progress and monitor      Knee/Hip Exercises: Machines for Strengthening   Cybex Leg Press  Seat 6: Bil LE 95# 30x, VC for control and increased knee extension: 45# LTLE 3x10       Vasopneumatic   Number Minutes Vasopneumatic   10 minutes    Vasopnuematic Location   Knee    Vasopneumatic Pressure  High    Vasopneumatic Temperature   3 flakes        standing hip series - 10x each side Step ups - 20x Lt  LE      PT Education - 05/21/19 1448    Education Details  Access Code: [email or web address]    Person(s) Educated  Patient    Methods  Explanation;Demonstration;Handout;Verbal cues    Comprehension  Verbalized understanding;Returned demonstration       PT Short Term Goals - 04/16/19 1252      PT SHORT TERM GOAL #1   Title  Pt will report she is back to at least 60% of her normal activites around the home    Status  Achieved      PT SHORT TERM GOAL #2   Title  Pt will be ind with initial HEP    Status  Achieved      PT SHORT TERM GOAL #3   Title  Pt will be able to demonstrate Lt knee ext to 0 and flexion to >120 deg for improved function    Time  6    Period  Weeks    Status  On-going    Target Date  05/07/19        PT Long Term Goals - 05/21/19 1416      PT LONG TERM GOAL #1   Title  Pt will demonstrate 5x sit to stand in < 13 sec.    Status  Achieved      PT LONG TERM GOAL #2   Title  FOTO< or = to 45% limted    Status  Achieved      PT LONG TERM GOAL #3   Title  Pt will be able to get up and down stairs to the second floor in her home so she can stay in her bedroom.     Status  Achieved      PT LONG TERM GOAL #4   Title  Pt will report she is able to manage her pain enough so that she can return to walking around the store for at least 30 minutes at a time.    Status  Achieved      PT LONG TERM GOAL #5   Title  Pt will be ind with advanced HEP    Baseline  she is ind and daughter has been educated on HEP as well as    Status  Achieved            Plan - 05/21/19 1606    Clinical Impression Statement  Pt has met all of her goals with skilled PT.  knee flexion ROM is 125 deg.  She continues to lack a few degrees of knee extension that is noticeable during her walking as she demonstrates less heel strike on the Lt side.  Pt is ind with HEP and will benefit from continuing to work on exercises at home and if able with trainer at the gym. Pt is discharged with HEP today.    PT Treatment/Interventions  ADLs/Self Care Home Management;Aquatic Therapy;Biofeedback;Cryotherapy;Electrical Stimulation;Moist Heat;Neuromuscular re-education;Passive range of motion;Dry needling;Manual techniques;Patient/family education;Therapeutic exercise;Therapeutic activities;Gait training    PT Next Visit Plan  d/c today    PT Home Exercise Plan  Access Code: [email or web address]    [email or web address] and Agree with Plan of Care  Patient;Family member/caregiver    Family Member Consulted  daughter       Patient will benefit from skilled therapeutic intervention in order to improve the following deficits and impairments:  Abnormal gait, Pain, Increased fascial restricitons, Impaired flexibility, Decreased strength, Decreased range of motion, Difficulty walking, Decreased skin integrity, Increased edema  Visit Diagnosis: Acute pain of left  knee  Muscle weakness (generalized)  Stiffness of left knee, not elsewhere classified     Problem List Patient Active Problem List   Diagnosis Date Noted  . Hyperlipidemia   . Sore throat   . Chronic atrial fibrillation ([place]) 08/01/2016  . Chronic  systolic CHF (congestive heart failure) ([place]) 08/01/2016  . Left-sided weakness 03/13/2013  . TIA (transient ischemic attack) 03/13/2013  . HTN (hypertension) 03/13/2013  . Dyslipidemia 03/13/2013  . Depression 03/13/2013  . Breast cancer ([place]) 09/01/2012  . Deafness, sensorineural 07/31/2012  . Anxiety 03/17/2011  . Arthropathia 11/28/2007    Camillo Flaming Padme Arriaga,PT 05/21/2019, 4:14 PM  [facility] [address], [place], [place], [address] Phone: [phone]   Fax:  [phone]  Name: Natalie Mendoza MRN: [id number] Date of Birth: 1933-02-13  PHYSICAL THERAPY DISCHARGE SUMMARY  Visits from Start of Care: 18  Current functional level related to goals / functional outcomes: See above   Remaining deficits: See above   Education / Equipment: HEP  Plan: Patient agrees to discharge.  Patient goals were met. Patient is being discharged due to meeting the stated rehab goals.  ?????     [employer], PT 05/21/19 4:17 PM

## 2019-05-30 ENCOUNTER — Telehealth: Payer: Self-pay

## 2019-05-30 NOTE — Telephone Encounter (Signed)
Left VM for Natalie Mendoza to call back in regards to the PREP at the [facility].

## 2019-06-12 NOTE — Progress Notes (Signed)
[facility] Report   Patient Details  Name: AZIE KARON MRN: [id number] Date of Birth: [birth year] Age: 85 y.o. PCP: Cari Caraway, MD  Vitals:   06/12/19 1219  BP: 115/60  Pulse: (!) 57  Resp: 14  SpO2: 99%  Weight: 162 lb (73.5 kg)  Height: 5' 5.5" (1.664 m)     [facility] Eval - 06/12/19 1200      Referral    Referring Provider  PT    Reason for referral  Orthopedic    Program Start Date  06/25/19      Measurement   Waist Circumference  44 inches    Hip Circumference  45 inches      Information for Trainer   Goals  Maintain exercise    TKR in Sept, wants to keep gains, increase endurance    Current Exercise  ADL     Orthopedic Concerns  LTKR 9/20    Pertinent Medical History  CHF, HTN, AFIB per chart     Current Barriers  none    Medications that affect exercise  Beta blocker      Timed Up and Go (TUGS)   Timed Up and Go  Low risk <9 seconds   waived      Mobility and Daily Activities   I find it easy to walk up or down two or more flights of stairs.  4    I have no trouble taking out the trash.  4    I do housework such as vacuuming and dusting on my own without difficulty.  3    I can easily lift a gallon of milk (8lbs).  4    I can easily walk a mile.  1    I have no trouble reaching into high cupboards or reaching down to pick up something from the floor.  4    I do not have trouble doing out-door work such as Armed forces logistics/support/administrative officer, raking leaves, or gardening.  1      Mobility and Daily Activities   I feel younger than my age.  4    I feel independent.  3    I feel energetic.  3    I live an active life.   4    I feel strong.  3    I feel healthy.  3    I feel active as other people my age.  4      How fit and strong are you.   Fit and Strong Total Score  45      Past Medical History:  Diagnosis Date  . Arrhythmia    Atrial Fib  . Atrial fibrillation ([place])   . Breast cancer ([place])   . CHF (congestive heart failure) ([place])    . Chronic systolic CHF (congestive heart failure) ([place])   . Depression   . Hearing impairment   . Hx of joint problems   . Hypertension   . Osteoarthritis   . Osteoporosis   . Pelvic relaxation   . Stroke [facility])    fully resolved TIA  . Vaginal pessary present    Past Surgical History:  Procedure Laterality Date  . BREAST LUMPECTOMY    . CATARACT EXTRACTION W/ INTRAOCULAR LENS  IMPLANT, BILATERAL Bilateral   . REPLACEMENT TOTAL KNEE     left   Social History   Tobacco Use  Smoking Status Never Smoker  Smokeless Tobacco Never Used     PREP will start 06/25/2019  at [facility] every M/W from 230p to 345pm x 12 wks        Barnett Hatter 06/12/2019, 12:26 PM

## 2019-06-21 ENCOUNTER — Telehealth: Payer: Self-pay

## 2019-06-21 NOTE — Telephone Encounter (Signed)
VMT Patient to notify of change in start date for PREP to January 18th. Requested call back to verify receipt of message.

## 2019-07-24 ENCOUNTER — Telehealth: Payer: Self-pay

## 2019-07-24 DIAGNOSIS — E559 Vitamin D deficiency, unspecified: Secondary | ICD-10-CM | POA: Insufficient documentation

## 2019-07-24 NOTE — Telephone Encounter (Signed)
LVMT patient regarding date of next PREP class 08/14/2019 start every Tues/Thur 1p-215p x 12wks.  Intake already done. Request call back for questions.

## 2019-08-14 NOTE — Progress Notes (Signed)
[facility] PREP Weekly Session   Patient Details  Name: Natalie Mendoza MRN: [id number] Date of Birth: 1932/09/11 Age: 86 y.o. PCP: Cari Caraway, MD  There were no vitals filed for this visit.  [employer] Weekly seesion - 08/14/19 1400      Weekly Session   Topic Discussed  Goal setting and welcome to the program   Scale of perceived exertion    Comments  Copy of 12 wks strength training given       Introduction Orientation to [facility] Review of program Reminder of goals Plans for this week and fit testing Thursday  Gentle stretch taught   Barnett Hatter 08/14/2019, 2:57 PM

## 2019-08-15 DIAGNOSIS — E119 Type 2 diabetes mellitus without complications: Secondary | ICD-10-CM | POA: Insufficient documentation

## 2019-08-17 ENCOUNTER — Other Ambulatory Visit: Payer: Self-pay

## 2019-08-17 ENCOUNTER — Emergency Department (HOSPITAL_BASED_OUTPATIENT_CLINIC_OR_DEPARTMENT_OTHER)
Admission: EM | Admit: 2019-08-17 | Discharge: 2019-08-17 | Disposition: A | Discharge status: Home / Self Care | Payer: Medicare Other | Attending: Emergency Medicine | Admitting: Emergency Medicine

## 2019-08-17 ENCOUNTER — Encounter (HOSPITAL_BASED_OUTPATIENT_CLINIC_OR_DEPARTMENT_OTHER): Payer: Self-pay

## 2019-08-17 ENCOUNTER — Emergency Department (HOSPITAL_BASED_OUTPATIENT_CLINIC_OR_DEPARTMENT_OTHER): Payer: Medicare Other

## 2019-08-17 DIAGNOSIS — I110 Hypertensive heart disease with heart failure: Secondary | ICD-10-CM | POA: Insufficient documentation

## 2019-08-17 DIAGNOSIS — Z7901 Long term (current) use of anticoagulants: Secondary | ICD-10-CM | POA: Diagnosis not present

## 2019-08-17 DIAGNOSIS — I4891 Unspecified atrial fibrillation: Secondary | ICD-10-CM | POA: Insufficient documentation

## 2019-08-17 DIAGNOSIS — M79672 Pain in left foot: Secondary | ICD-10-CM | POA: Insufficient documentation

## 2019-08-17 DIAGNOSIS — I5022 Chronic systolic (congestive) heart failure: Secondary | ICD-10-CM | POA: Diagnosis not present

## 2019-08-17 DIAGNOSIS — M79606 Pain in leg, unspecified: Secondary | ICD-10-CM

## 2019-08-17 DIAGNOSIS — Z79899 Other long term (current) drug therapy: Secondary | ICD-10-CM | POA: Insufficient documentation

## 2019-08-17 DIAGNOSIS — Z853 Personal history of malignant neoplasm of breast: Secondary | ICD-10-CM | POA: Diagnosis not present

## 2019-08-17 DIAGNOSIS — Z8673 Personal history of transient ischemic attack (TIA), and cerebral infarction without residual deficits: Secondary | ICD-10-CM | POA: Diagnosis not present

## 2019-08-17 NOTE — Discharge Instructions (Addendum)
Your DVT study was negative today.  You may alternate tylenol or ibuprofen for your symptoms.  Follow-up with your primary care physician in 1 week.

## 2019-08-17 NOTE — ED Triage Notes (Signed)
Pt c/o pain, discoloration to left LE started last night-denies injury-daughter with pt-NAD-steady gait

## 2019-08-17 NOTE — ED Provider Notes (Signed)
[facility] Provider Note   CSN: [id number] Arrival date & time: 08/17/19  1804     History No chief complaint on file.   Natalie Mendoza is a 86 y.o. female.  86 y.o female with a PMH of Afib, HTN. OA presents to the ED with a chief complaint of left foot swelling x today. Patient describes this as "pins and needles" to the lower aspect of her leg.  She also reported noting color changes, stating her leg looked somewhat black.  Has not happened in the past.  She does have a prior history of a left knee replacement in September.  She is currently not on any anticoagulation.  She denies any fevers, trauma, prior history of blood clots, shortness of breath or chest pain.  The history is provided by the patient.       Past Medical History:  Diagnosis Date  . Arrhythmia    Atrial Fib  . Atrial fibrillation ([place])   . Breast cancer ([place])   . CHF (congestive heart failure) ([place])   . Chronic systolic CHF (congestive heart failure) ([place])   . Depression   . Hearing impairment   . Hx of joint problems   . Hypertension   . Osteoarthritis   . Osteoporosis   . Pelvic relaxation   . Stroke [facility])    fully resolved TIA  . Vaginal pessary present     Patient Active Problem List   Diagnosis Date Noted  . Hyperlipidemia   . Sore throat   . Chronic atrial fibrillation ([place]) 08/01/2016  . Chronic systolic CHF (congestive heart failure) ([place]) 08/01/2016  . Left-sided weakness 03/13/2013  . TIA (transient ischemic attack) 03/13/2013  . HTN (hypertension) 03/13/2013  . Dyslipidemia 03/13/2013  . Depression 03/13/2013  . Breast cancer ([place]) 09/01/2012  . Deafness, sensorineural 07/31/2012  . Anxiety 03/17/2011  . Arthropathia 11/28/2007    Past Surgical History:  Procedure Laterality Date  . BREAST LUMPECTOMY    . CATARACT EXTRACTION W/ INTRAOCULAR LENS  IMPLANT, BILATERAL Bilateral   . REPLACEMENT TOTAL KNEE     left     OB History    Gravida    2   Para  2   Term      Preterm      AB      Living        SAB      TAB      Ectopic      Multiple      Live Births              Family History  Problem Relation Age of Onset  . Hypertension Mother   . Stroke Mother   . Ovarian cancer Maternal Grandmother     Social History   Tobacco Use  . Smoking status: Never Smoker  . Smokeless tobacco: Never Used  Substance Use Topics  . Alcohol use: No  . Drug use: No    Home Medications Prior to Admission medications   Medication Sig Start Date End Date Taking? Authorizing Provider  amLODipine (NORVASC) 2.5 MG tablet Take 2.5 mg by mouth daily.   Yes [provider]  anastrozole (ARIMIDEX) 1 MG tablet Take 1 mg by mouth daily.   Yes [provider]  apixaban (ELIQUIS) 5 MG TABS tablet Take 1 tablet (5 mg total) by mouth 2 (two) times daily. 03/15/13  Yes OtiBrantley Stage, MD  diltiazem (CARDIZEM SR) 60 MG 12 hr capsule  Take 1 capsule (60 mg total) by mouth every 12 (twelve) hours. 03/15/13  Yes Oti, Brantley Stage, MD  DULoxetine (CYMBALTA) 30 MG capsule Take 30 mg by mouth daily.   Yes [provider]  erythromycin ophthalmic ointment 1 application at bedtime as needed.   Yes [provider]  estradiol (ESTRACE) 0.1 MG/GM vaginal cream Place 1 Applicatorful vaginally 2 (two) times a week.   Yes [provider]  spironolactone (ALDACTONE) 25 MG tablet Take 25 mg by mouth daily.   Yes [provider]  valsartan (DIOVAN) 160 MG tablet Take 80 mg by mouth daily.    Yes [provider]  Vitamin D, Ergocalciferol, (DRISDOL) 1.25 MG (50000 UNIT) CAPS capsule Take 50,000 Units by mouth every 7 (seven) days.   Yes [provider]  bisoprolol (ZEBETA) 10 MG tablet Take 10 mg by mouth every evening.  12/05/14 04/14/19  [provider]    Allergies    Patient has no known allergies.  Review of Systems   Review of Systems  Constitutional: Negative for  chills and fever.  HENT: Negative for ear pain and sore throat.   Eyes: Negative for pain and visual disturbance.  Respiratory: Negative for cough and shortness of breath.   Cardiovascular: Negative for chest pain, palpitations and leg swelling.  Gastrointestinal: Negative for abdominal pain and vomiting.  Genitourinary: Negative for dysuria and hematuria.  Musculoskeletal: Positive for myalgias. Negative for arthralgias and back pain.  Skin: Negative for color change and rash.  Neurological: Negative for seizures and syncope.  All other systems reviewed and are negative.   Physical Exam Updated Vital Signs BP (!) 154/89 (BP Location: Left Arm)   Pulse (!) 50   Temp 97.8 F (36.6 C) (Oral)   Resp 20   Ht 5\' 5"  (1.651 m)   Wt 71.7 kg   SpO2 95%   BMI 26.29 kg/m   Physical Exam Vitals and nursing note reviewed.  Constitutional:      General: She is not in acute distress.    Appearance: She is well-developed.  HENT:     Head: Normocephalic and atraumatic.     Mouth/Throat:     Pharynx: No oropharyngeal exudate.  Eyes:     Pupils: Pupils are equal, round, and reactive to light.  Cardiovascular:     Rate and Rhythm: Regular rhythm.     Heart sounds: Normal heart sounds.  Pulmonary:     Effort: Pulmonary effort is normal. No respiratory distress.     Breath sounds: Normal breath sounds.  Abdominal:     General: Bowel sounds are normal. There is no distension.     Palpations: Abdomen is soft.     Tenderness: There is no abdominal tenderness.  Musculoskeletal:        General: No tenderness or deformity.     Cervical back: Normal range of motion.     Right lower leg: No edema.     Left lower leg: No lacerations or tenderness. No edema.       Legs:     Comments: Tenderness to palpation along the ankle region, no obvious deformity palpated.  Pulses are present, capillary refill is intact.  No changes in skin or erythema.  No discoloration noted.  Skin:    General: Skin is  warm and dry.  Neurological:     Mental Status: She is alert and oriented to person, place, and time.     ED Results / Procedures / Treatments  Labs (all labs ordered are listed, but only abnormal results are displayed) Labs Reviewed - No data to display  EKG None  Radiology US Venous Img Lower Unilateral Left (DVT)  Result Date: 08/17/2019 CLINICAL DATA:  Left lower extremity varicose veins, left leg pain, anticoagulated EXAM: LEFT LOWER EXTREMITY VENOUS DOPPLER ULTRASOUND TECHNIQUE: Gray-scale sonography with compression, as well as color and duplex ultrasound, were performed to evaluate the deep venous system(s) from the level of the common femoral vein through the popliteal and proximal calf veins. COMPARISON:  None. FINDINGS: VENOUS Normal compressibility of the common femoral, superficial femoral, and popliteal veins, as well as the visualized calf veins. Visualized portions of profunda femoral vein and great saphenous vein unremarkable. No filling defects to suggest DVT on grayscale or color Doppler imaging. Doppler waveforms show normal direction of venous flow, normal respiratory phasicity and response to augmentation. Limited views of the contralateral common femoral vein are unremarkable. OTHER Numerous varicosities within the left lower extremity are completely compressible without superficial thrombophlebitis. Limitations: none IMPRESSION: No femoropopliteal DVT nor evidence of DVT within the visualized calf veins. If clinical symptoms are inconsistent or if there are persistent or worsening symptoms, further imaging (possibly involving the iliac veins) may be warranted. Electronically Signed   By: Randa Ngo M.D.   On: 08/17/2019 19:13    Procedures Procedures (including critical care time)  Medications Ordered in ED Medications - No data to display  ED Course  I have reviewed the triage vital signs and the nursing notes.  Pertinent labs & imaging results that were  available during my care of the patient were reviewed by me and considered in my medical decision making (see chart for details).    MDM Rules/Calculators/A&P   With a past medical history of CHF presents to the ED with complaints of left foot swelling which began today.  Patient reports no trauma, reports she noted discoloration to the area, this was black in color.  She is evaluated by urgent care today and sent here to rule out DVT.  During my evaluation pulses are present, neurovascularly intact, no discoloration noted to the area, there is significant amount of varicose vein, some of these appear slightly more prominent but not erythematous.  He is ambulatory with a steady gait in the ED, does not have any chest pain, shortness of breath.  She does report she usually gets short of breath with emulation but reports this is normal for her.  No changes in the skin, fevers, lower suspicion for cellulitis.  No trauma, no injury per patient.    I have discussed case with Dr. Roslynn Amble who has seen patient and agrees with plan and management.   Patient is stable, vitals are within normal limits, return precautions discussed at length.    Portions of this note were generated with [employer]. Dictation errors may occur despite best attempts at proofreading.  Final Clinical Impression(s) / ED Diagnoses Final diagnoses:  Acute foot pain, left    Rx / DC Orders ED Discharge Orders    None       Natalie Mendoza, Hershal Coria 08/17/19 1949    Natalie Starch, MD 08/17/19 2340

## 2019-08-21 NOTE — Progress Notes (Signed)
[facility] PREP Weekly Session   Patient Details  Name: Natalie Mendoza MRN: [id number] Date of Birth: 1933/04/22 Age: 86 y.o. PCP: Cari Caraway, MD  Vitals:   08/21/19 1654  Weight: 160 lb (72.6 kg)    [facility] Weekly seesion - 08/21/19 1600      Weekly Session   Topic Discussed  Other ways to be active;Importance of resistance training    Minutes exercised this week  0 minutes    Classes attended to date  3    Comments  Has trouble hearing in exercise room enc to read materials given      Fun things since last class: went out to lunch with daughter Grateful for: my family especially my daughter Lattie Haw Nutrition Celebration: ate less crackers Barriers/struggles: losing weight   Barnett Hatter 08/21/2019, 4:55 PM

## 2019-08-28 NOTE — Progress Notes (Signed)
[facility] PREP Weekly Session   Patient Details  Name: Natalie Mendoza MRN: [id number] Date of Birth: 1932/07/30 Age: 86 y.o. PCP: Cari Caraway, MD  Vitals:   08/28/19 1759  Weight: 157 lb 9.6 oz (71.5 kg)    [facility] Weekly seesion - 08/28/19 1700      Weekly Session   Topic Discussed  Healthy eating tips   hidden sugars brochure given   Minutes exercised this week  50 minutes   30 cardio, 20 min of strength, no flexibility   Classes attended to date  5      Grateful for: recovering from knee surgery  Pam Tally Joe 08/28/2019, 6:00 PM

## 2019-09-04 NOTE — Progress Notes (Signed)
[facility] PREP Weekly Session   Patient Details  Name: Natalie Mendoza MRN: [id number] Date of Birth: 01-Oct-1932 Age: 86 y.o. PCP: Cari Caraway, MD  Vitals:   09/04/19 1749  Weight: 154 lb 6.4 oz (70 kg)    [facility] Weekly seesion - 09/04/19 1700      Weekly Session   Topic Discussed  Health habits    Minutes exercised this week  0 minutes   did not note cardio from last week   Classes attended to date  6      Fun things since last week: worked on my yard Grateful for: that I am able to come to class Barriers/struggles: with working out  Reminded to pace herself  Reminded on how to safely exercise on the treadmill     [employer] 09/04/2019, 5:50 PM

## 2019-09-11 NOTE — Progress Notes (Signed)
[facility] PREP Weekly Session   Patient Details  Name: Natalie Mendoza MRN: [id number] Date of Birth: [birth year] Age: 86 y.o. PCP: Cari Caraway, MD  Vitals:   09/11/19 1627  Weight: 153 lb 12.8 oz (69.8 kg)    [facility] Weekly seesion - 09/11/19 1600      Weekly Session   Topic Discussed  Restaurant Eating   salt demo   Minutes exercised this week  0 minutes   did not record although she was present   Classes attended to date  8      Fun things: want shopping Grateful: Come to this class. Have been with family, drive my car and clean my house.   Barnett Hatter 09/11/2019, 4:28 PM

## 2019-09-25 NOTE — Progress Notes (Signed)
[facility] PREP Weekly Session   Patient Details  Name: CORTNE KOWALKE MRN: [id number] Date of Birth: 1932/11/05 Age: 86 y.o. PCP: Cari Caraway, MD  Vitals:   09/25/19 1538  Weight: 153 lb (69.4 kg)    [facility] Weekly seesion - 09/25/19 1500      Weekly Session   Topic Discussed  Expectations and non-scale victories   sugar demo   Minutes exercised this week  0 minutes   did not chart minutes   Classes attended to date  57      Fun things since last meeting: had fun in the [employer] for: able to walk on both legs Nutrition celebrations: enjoyed a special pie last week Barriers/struggles: walking longer while last week   Barnett Hatter 09/25/2019, 3:39 PM

## 2019-09-29 ENCOUNTER — Other Ambulatory Visit: Payer: Self-pay

## 2019-09-29 ENCOUNTER — Emergency Department (HOSPITAL_BASED_OUTPATIENT_CLINIC_OR_DEPARTMENT_OTHER): Payer: Medicare Other

## 2019-09-29 ENCOUNTER — Encounter (HOSPITAL_BASED_OUTPATIENT_CLINIC_OR_DEPARTMENT_OTHER): Payer: Self-pay

## 2019-09-29 ENCOUNTER — Emergency Department (HOSPITAL_BASED_OUTPATIENT_CLINIC_OR_DEPARTMENT_OTHER)
Admission: EM | Admit: 2019-09-29 | Discharge: 2019-09-29 | Disposition: A | Discharge status: Home / Self Care | Payer: Medicare Other | Attending: Emergency Medicine | Admitting: Emergency Medicine

## 2019-09-29 DIAGNOSIS — M79672 Pain in left foot: Secondary | ICD-10-CM | POA: Insufficient documentation

## 2019-09-29 DIAGNOSIS — Z7982 Long term (current) use of aspirin: Secondary | ICD-10-CM | POA: Insufficient documentation

## 2019-09-29 DIAGNOSIS — Z7901 Long term (current) use of anticoagulants: Secondary | ICD-10-CM | POA: Diagnosis not present

## 2019-09-29 DIAGNOSIS — Z79899 Other long term (current) drug therapy: Secondary | ICD-10-CM | POA: Insufficient documentation

## 2019-09-29 DIAGNOSIS — R230 Cyanosis: Secondary | ICD-10-CM | POA: Diagnosis not present

## 2019-09-29 DIAGNOSIS — I5022 Chronic systolic (congestive) heart failure: Secondary | ICD-10-CM | POA: Insufficient documentation

## 2019-09-29 DIAGNOSIS — I110 Hypertensive heart disease with heart failure: Secondary | ICD-10-CM | POA: Insufficient documentation

## 2019-09-29 MED ORDER — ASPIRIN 81 MG PO CHEW: 81.0000 mg | CHEWABLE_TABLET | Freq: Every day | ORAL | 0 refills | Status: DC

## 2019-09-29 NOTE — ED Provider Notes (Signed)
[place] EMERGENCY DEPARTMENT Provider Note   CSN: [id number] Arrival date & time: 09/29/19  1508     History Chief Complaint  Patient presents with  . Foot Pain    Natalie Mendoza is a 86 y.o. female.  86 year old female with past medical history including atrial fibrillation on anticoagulation, CHF, TIA, hypertension, osteoporosis, breast cancer who presents with left foot pain.  Patient has been dealing with left foot pain for several weeks; she saw her podiatrist who has done 2 different injections on the plantar surface of her foot. She reports that the pain is present when she is walking and absent when she is at rest. No numbness or tingling.  Recently, daughter has noticed that her toes look discolored compared to her right foot.  She does have a history of varicose veins but this looks worse than usual.  She is compliant with anticoagulation.  She denies any calf pain at rest but states that it is tender when I palpate it.  She had a left knee replacement last September.  She notes some mild increase in baseline shortness of breath lately but it is because she has been doing more activities than usual.  No chest pain.  Daughter is concerned that foot may be related to new diagnosis of diabetes.  The history is provided by the patient and a relative.  Foot Pain       Past Medical History:  Diagnosis Date  . Arrhythmia    Atrial Fib  . Atrial fibrillation ([place])   . Breast cancer ([place])   . CHF (congestive heart failure) ([place])   . Chronic systolic CHF (congestive heart failure) ([place])   . Depression   . Hearing impairment   . Hx of joint problems   . Hypertension   . Osteoarthritis   . Osteoporosis   . Pelvic relaxation   . Stroke [facility])    fully resolved TIA  . Vaginal pessary present     Patient Active Problem List   Diagnosis Date Noted  . Hyperlipidemia   . Sore throat   . Chronic atrial fibrillation ([place]) 08/01/2016  . Chronic systolic CHF  (congestive heart failure) ([place]) 08/01/2016  . Left-sided weakness 03/13/2013  . TIA (transient ischemic attack) 03/13/2013  . HTN (hypertension) 03/13/2013  . Dyslipidemia 03/13/2013  . Depression 03/13/2013  . Breast cancer ([place]) 09/01/2012  . Deafness, sensorineural 07/31/2012  . Anxiety 03/17/2011  . Arthropathia 11/28/2007    Past Surgical History:  Procedure Laterality Date  . BREAST LUMPECTOMY    . CATARACT EXTRACTION W/ INTRAOCULAR LENS  IMPLANT, BILATERAL Bilateral   . REPLACEMENT TOTAL KNEE     left     OB History    Gravida  2   Para  2   Term      Preterm      AB      Living        SAB      TAB      Ectopic      Multiple      Live Births              Family History  Problem Relation Age of Onset  . Hypertension Mother   . Stroke Mother   . Ovarian cancer Maternal Grandmother     Social History   Tobacco Use  . Smoking status: Never Smoker  . Smokeless tobacco: Never Used  Substance Use Topics  . Alcohol use: No  . Drug  use: No    Home Medications Prior to Admission medications   Medication Sig Start Date End Date Taking? Authorizing Provider  amLODipine (NORVASC) 2.5 MG tablet Take 2.5 mg by mouth daily.    [provider]  anastrozole (ARIMIDEX) 1 MG tablet Take 1 mg by mouth daily.    [provider]  apixaban (ELIQUIS) 5 MG TABS tablet Take 1 tablet (5 mg total) by mouth 2 (two) times daily. 03/15/13   Monika Salk, MD  aspirin 81 MG chewable tablet Chew 1 tablet (81 mg total) by mouth daily. 09/29/19   Joley Utecht, Wenda Overland, MD  bisoprolol (ZEBETA) 10 MG tablet Take 10 mg by mouth every evening.  12/05/14 04/14/19  [provider]  diltiazem (CARDIZEM SR) 60 MG 12 hr capsule Take 1 capsule (60 mg total) by mouth every 12 (twelve) hours. 03/15/13   Monika Salk, MD  DULoxetine (CYMBALTA) 30 MG capsule Take 30 mg by mouth daily.    [provider]  erythromycin ophthalmic ointment 1 application  at bedtime as needed.    [provider]  estradiol (ESTRACE) 0.1 MG/GM vaginal cream Place 1 Applicatorful vaginally 2 (two) times a week.    [provider]  spironolactone (ALDACTONE) 25 MG tablet Take 25 mg by mouth daily.    [provider]  valsartan (DIOVAN) 160 MG tablet Take 80 mg by mouth daily.     [provider]  Vitamin D, Ergocalciferol, (DRISDOL) 1.25 MG (50000 UNIT) CAPS capsule Take 50,000 Units by mouth every 7 (seven) days.    [provider]    Allergies    Patient has no known allergies.  Review of Systems   Review of Systems All other systems reviewed and are negative except that which was mentioned in HPI  Physical Exam Updated Vital Signs BP 127/89 (BP Location: Right Arm)   Pulse 69   Temp 97.8 F (36.6 C) (Oral)   Resp 18   Ht 5\' 6"  (1.676 m)   Wt 69.4 kg   SpO2 96%   BMI 24.69 kg/m   Physical Exam Vitals and nursing note reviewed.  Constitutional:      General: She is not in acute distress.    Appearance: She is well-developed.  HENT:     Head: Normocephalic and atraumatic.  Eyes:     Conjunctiva/sclera: Conjunctivae normal.  Cardiovascular:     Rate and Rhythm: Rhythm irregular.     Pulses: Normal pulses.  Musculoskeletal:     Comments: Scar over left knee; mild L calf tenderness without obvious swelling compared to right  Skin:    General: Skin is warm and dry.     Capillary Refill: Capillary refill takes more than 3 seconds.     Findings: No erythema.     Comments: 2+ DP pulses b/l feet; L toes cyanotic and cold, R toes cold but cyanosis not as pronounced; scattered varicose veins on b/l LE and dorsal feet  Neurological:     Mental Status: She is alert and oriented to person, place, and time.     Sensory: No sensory deficit.     Comments: Normal sensation b/l lower extremities  Psychiatric:        Judgment: Judgment normal.     ED Results / Procedures / Treatments   Labs (all labs  ordered are listed, but only abnormal results are displayed) Labs Reviewed - No data to display  EKG None  Radiology US Venous Img Lower  Left (  DVT Study)  Result Date: 09/29/2019 CLINICAL DATA:  Left foot cyanosis, calf tenderness EXAM: LEFT LOWER EXTREMITY VENOUS DOPPLER ULTRASOUND TECHNIQUE: Gray-scale sonography with graded compression, as well as color Doppler and duplex ultrasound were performed to evaluate the lower extremity deep venous systems from the level of the common femoral vein and including the common femoral, femoral, profunda femoral, popliteal and calf veins including the posterior tibial, peroneal and gastrocnemius veins when visible. The superficial great saphenous vein was also interrogated. Spectral Doppler was utilized to evaluate flow at rest and with distal augmentation maneuvers in the common femoral, femoral and popliteal veins. COMPARISON:  08/17/2019 FINDINGS: Contralateral Common Femoral Vein: Respiratory phasicity is normal and symmetric with the symptomatic side. No evidence of thrombus. Normal compressibility. Common Femoral Vein: No evidence of thrombus. Normal compressibility, respiratory phasicity and response to augmentation. Saphenofemoral Junction: No evidence of thrombus. Normal compressibility and flow on color Doppler imaging. Profunda Femoral Vein: No evidence of thrombus. Normal compressibility and flow on color Doppler imaging. Femoral Vein: No evidence of thrombus. Normal compressibility, respiratory phasicity and response to augmentation. Popliteal Vein: No evidence of thrombus. Normal compressibility, respiratory phasicity and response to augmentation. Calf Veins: No evidence of thrombus. Normal compressibility and flow on color Doppler imaging. Superficial Great Saphenous Vein: No evidence of thrombus. Normal compressibility. Venous Reflux:  None. Other Findings:  None. IMPRESSION: No evidence of left lower extremity deep venous thrombosis. Electronically  Signed   By: Davina Poke D.O.   On: 09/29/2019 17:58    Procedures Procedures (including critical care time)  Medications Ordered in ED Medications - No data to display  ED Course  I have reviewed the triage vital signs and the nursing notes.  Pertinent imaging results that were available during my care of the patient were reviewed by me and considered in my medical decision making (see chart for details).    MDM Rules/Calculators/A&P                      Pt had palpable DP pulses b/l. The fact that she is compliant w/ anticoagulation makes DVT unlikely, however DDx includes large DVT, arterial problem, microvascular disease. [place] negative for DVT. I am reassured that she has easily palpable DP pulse however her cyanotic toes do raise concern for microvascular disease. I discussed w/ vascular surgeon, Dr. Donzetta Matters, who can f/u pt in clinic this week. He recommended adding baby aspirin to current Eliquis regimen. Discussed this plan w/ family.  Instructed to report directly to the clinic or to [facility], ER if symptoms worsen or if she starts having severe foot pain.  Family voiced understanding. Final Clinical Impression(s) / ED Diagnoses Final diagnoses:  Foot pain, left  Blue toes    Rx / DC Orders ED Discharge Orders         Ordered    aspirin 81 MG chewable tablet  Daily     09/29/19 1834           Latash Nouri, Wenda Overland, MD 09/29/19 2319

## 2019-09-29 NOTE — Discharge Instructions (Signed)
IF YOUR SYMPTOMS GET WORSE, CALL THE CLINIC TO GET A SAME-DAY APPOINTMENT OR GO DIRECTLY TO [place] EMERGENCY ROOM.

## 2019-09-29 NOTE — ED Notes (Signed)
US at the bedside

## 2019-09-29 NOTE — ED Triage Notes (Signed)
Pt c/o L foot pain. Noted to have cyanotic to black discoloration. Decreased cap refill distally. 2+ DP pulse is present.

## 2019-10-02 ENCOUNTER — Telehealth: Payer: Self-pay

## 2019-10-02 NOTE — Telephone Encounter (Signed)
Received call from patient. Was in ED yesterday due to discolored/black toes. Don't know yet the diagnosis. Will be following up with [facility] on Friday. Will hold exercise for now. Patient will keep me posted on when she can return to class.

## 2019-10-05 ENCOUNTER — Encounter (HOSPITAL_COMMUNITY): Payer: Medicare Other

## 2019-10-05 ENCOUNTER — Ambulatory Visit: Payer: Medicare Other | Admitting: Vascular Surgery

## 2019-10-05 ENCOUNTER — Other Ambulatory Visit: Payer: Self-pay

## 2019-10-05 DIAGNOSIS — I739 Peripheral vascular disease, unspecified: Secondary | ICD-10-CM

## 2019-10-05 DIAGNOSIS — I872 Venous insufficiency (chronic) (peripheral): Secondary | ICD-10-CM | POA: Insufficient documentation

## 2019-10-05 NOTE — Addendum Note (Signed)
Addended by: [place] Cerise C on: 10/05/2019 09:04 AM   Modules accepted: Orders

## 2019-10-24 ENCOUNTER — Other Ambulatory Visit: Payer: Self-pay

## 2019-10-24 ENCOUNTER — Ambulatory Visit (INDEPENDENT_AMBULATORY_CARE_PROVIDER_SITE_OTHER): Payer: Medicare Other

## 2019-10-24 ENCOUNTER — Other Ambulatory Visit: Payer: Self-pay | Admitting: Podiatry

## 2019-10-24 ENCOUNTER — Ambulatory Visit (INDEPENDENT_AMBULATORY_CARE_PROVIDER_SITE_OTHER): Payer: Medicare Other | Admitting: Podiatry

## 2019-10-24 DIAGNOSIS — M79671 Pain in right foot: Secondary | ICD-10-CM

## 2019-10-24 DIAGNOSIS — M722 Plantar fascial fibromatosis: Secondary | ICD-10-CM

## 2019-10-24 DIAGNOSIS — L97512 Non-pressure chronic ulcer of other part of right foot with fat layer exposed: Secondary | ICD-10-CM

## 2019-10-24 DIAGNOSIS — I999 Unspecified disorder of circulatory system: Secondary | ICD-10-CM | POA: Diagnosis not present

## 2019-10-25 ENCOUNTER — Telehealth: Payer: Self-pay | Admitting: *Deleted

## 2019-10-25 ENCOUNTER — Encounter: Payer: Self-pay | Admitting: Podiatry

## 2019-10-25 DIAGNOSIS — L97512 Non-pressure chronic ulcer of other part of right foot with fat layer exposed: Secondary | ICD-10-CM

## 2019-10-25 DIAGNOSIS — I999 Unspecified disorder of circulatory system: Secondary | ICD-10-CM

## 2019-10-25 NOTE — Telephone Encounter (Signed)
Faxed required form, clinicals and demographics to [facility]. 

## 2019-10-25 NOTE — Progress Notes (Signed)
Subjective:  Patient ID: Natalie Mendoza, female    DOB: [birth year],  MRN: [id number]  Chief Complaint  Patient presents with  . Foot Pain    pt is here for bil foot pain, pt is concerned about ulcers that won't completely heal, pt also states that the left big toe is red as well.    86 y.o. female presents for wound care.  Patient presents with right fourth digit ulceration.  That has been going on for 3 weeks.  Has progressive gotten worse.  Patient states that she has bilateral varicosities that she was seeing a vascular surgery for.  She does not have any pain except for the area where the ulcer is.  There is a contracture of the fourth digit that is leading to excessive pressure to the distal tip.  Patient states that she is concerned about dependent rubor to the bilateral lower extremity with left side worse than right side.  She denies any other acute complaints.  States she denies a seeing anyone else for this.  She would like to know what her next treatment options are.  The pain is dull achy.   Review of Systems: Negative except as noted in the HPI. Denies N/V/F/Ch.  Past Medical History:  Diagnosis Date  . Arrhythmia    Atrial Fib  . Atrial fibrillation ([place])   . Breast cancer ([place])   . CHF (congestive heart failure) ([place])   . Chronic systolic CHF (congestive heart failure) ([place])   . Depression   . Hearing impairment   . Hx of joint problems   . Hypertension   . Osteoarthritis   . Osteoporosis   . Pelvic relaxation   . Stroke [facility])    fully resolved TIA  . Vaginal pessary present     Current Outpatient Medications:  .  amLODipine (NORVASC) 2.5 MG tablet, Take 2.5 mg by mouth daily., Disp: , Rfl:  .  anastrozole (ARIMIDEX) 1 MG tablet, Take 1 mg by mouth daily., Disp: , Rfl:  .  apixaban (ELIQUIS) 5 MG TABS tablet, Take 1 tablet (5 mg total) by mouth 2 (two) times daily., Disp: 60 tablet, Rfl: 0 .  aspirin 81 MG chewable tablet, Chew 1 tablet (81 mg total) by mouth  daily., Disp: 14 tablet, Rfl: 0 .  diltiazem (CARDIZEM SR) 60 MG 12 hr capsule, Take 1 capsule (60 mg total) by mouth every 12 (twelve) hours., Disp: 60 capsule, Rfl: 0 .  DULoxetine (CYMBALTA) 30 MG capsule, Take 30 mg by mouth daily., Disp: , Rfl:  .  erythromycin ophthalmic ointment, 1 application at bedtime as needed., Disp: , Rfl:  .  estradiol (ESTRACE) 0.1 MG/GM vaginal cream, Place 1 Applicatorful vaginally 2 (two) times a week., Disp: , Rfl:  .  spironolactone (ALDACTONE) 25 MG tablet, Take 25 mg by mouth daily., Disp: , Rfl:  .  valsartan (DIOVAN) 160 MG tablet, Take 80 mg by mouth daily. , Disp: , Rfl:  .  Vitamin D, Ergocalciferol, (DRISDOL) 1.25 MG (50000 UNIT) CAPS capsule, Take 50,000 Units by mouth every 7 (seven) days., Disp: , Rfl:  .  bisoprolol (ZEBETA) 10 MG tablet, Take 10 mg by mouth every evening. , Disp: , Rfl:   Social History   Tobacco Use  Smoking Status Never Smoker  Smokeless Tobacco Never Used    Allergies  Allergen Reactions  . Aspirin Other (See Comments)   Objective:  There were no vitals filed for this visit. There is no height or weight  on file to calculate BMI. Constitutional Well developed. Well nourished.  Vascular Dorsalis pedis pulses 1/4 palpable bilaterally. Posterior tibial pulses non palpable bilaterally. Capillary refill normal to all digits.  No cyanosis or clubbing noted. Pedal hair growth normal.  Neurologic Normal speech. Oriented to person, place, and time. Protective sensation absent  Dermatologic Wound Location: Right distal tip fourth digit ulceration.  No clinical signs of infection noted.  No malodor present.  No purulent drainage was expressed. Wound Base: Fibrotic slough Peri-wound: Calloused Exudate: Moderate amount Serosanguinous exudate Wound Measurements: -See below  Orthopedic: No pain to palpation either foot.   Radiographs: 3 views of bilateral skeletally mature foot: No bony abnormalities identified.  Mild  midfoot arthritis noted bilaterally.  No signs of osteomyelitis or soft tissue emphysema noted.  No signs of cortical irregularity or destruction noted.  Assessment:   1. Foot pain, right   2. Vascular abnormality   3. Toe ulcer, right, with fat layer exposed ([place])    Plan:  Patient was evaluated and treated and all questions answered.  Ulcer right distal tip fourth digit ulceration -Debridement as below. -Dressed with Betadine wet-to-dry 3 times a week, DSD. -Continue off-loading with surgical shoe. -Patient is a high risk of losing the fourth digit if the progression of the wound worsens.  I explained this to the patient and patient states understanding.  At this time I am not concerned for acute infection as there is no signs of bone infection as well as skin infection. -Given the vascular exam, I believe patient will benefit from ABIs PVRs to assess the vascular flow to the lower extremity.  Be scheduled to obtain ABIs PVRs. - Procedure: Excisional Debridement of Wound Tool: Sharp chisel blade/tissue nipper Rationale: Removal of non-viable soft tissue from the wound to promote healing.  Anesthesia: none Pre-Debridement Wound Measurements: 0.5 cm x 0.5 cm x 0.3 cm  Post-Debridement Wound Measurements: Same Type of Debridement: Sharp Excisional Tissue Removed: Non-viable soft tissue Blood loss: Minimal (<50cc) Depth of Debridement: subcutaneous tissue. Technique: Sharp excisional debridement to bleeding, viable wound base.  Wound Progress: This is a first clinical visit.  I will continue to monitor the progression of the wound. Site healing conversation 7 Dressing: Dry, sterile, compression dressing. Disposition: Patient tolerated procedure well. Patient to return in 1 week for follow-up.  No follow-ups on file.

## 2019-10-25 NOTE — Telephone Encounter (Signed)
-----   Message from Felipa Furnace, DPM sent at 10/25/2019  6:14 AM EDT ----- Regarding: ABIs PVRs Hi Valery,  Would you be able to order ABIs PVRs for this patient to assess the flow to the lower extremity.  Thank you

## 2019-11-02 ENCOUNTER — Ambulatory Visit (HOSPITAL_COMMUNITY)
Admission: RE | Admit: 2019-11-02 | Discharge: 2019-11-02 | Disposition: A | Discharge status: Home / Self Care | Payer: Medicare Other | Source: Ambulatory Visit | Attending: Podiatry | Admitting: Podiatry

## 2019-11-02 ENCOUNTER — Other Ambulatory Visit: Payer: Self-pay

## 2019-11-02 DIAGNOSIS — L97512 Non-pressure chronic ulcer of other part of right foot with fat layer exposed: Secondary | ICD-10-CM

## 2019-11-02 DIAGNOSIS — I999 Unspecified disorder of circulatory system: Secondary | ICD-10-CM | POA: Diagnosis not present

## 2019-11-12 ENCOUNTER — Ambulatory Visit (INDEPENDENT_AMBULATORY_CARE_PROVIDER_SITE_OTHER): Payer: Medicare Other | Admitting: Podiatry

## 2019-11-12 ENCOUNTER — Other Ambulatory Visit: Payer: Self-pay

## 2019-11-12 ENCOUNTER — Ambulatory Visit: Payer: No Typology Code available for payment source | Admitting: Podiatry

## 2019-11-12 DIAGNOSIS — M79671 Pain in right foot: Secondary | ICD-10-CM

## 2019-11-12 DIAGNOSIS — I7300 Raynaud's syndrome without gangrene: Secondary | ICD-10-CM | POA: Diagnosis not present

## 2019-11-12 DIAGNOSIS — I739 Peripheral vascular disease, unspecified: Secondary | ICD-10-CM | POA: Diagnosis not present

## 2019-11-12 DIAGNOSIS — L97512 Non-pressure chronic ulcer of other part of right foot with fat layer exposed: Secondary | ICD-10-CM

## 2019-11-13 ENCOUNTER — Encounter: Payer: Self-pay | Admitting: Podiatry

## 2019-11-13 MED ORDER — NITRO-BID 2 % TD OINT: 0.5000 [in_us] | TOPICAL_OINTMENT | Freq: Three times a day (TID) | TRANSDERMAL | 1 refills | Status: DC

## 2019-11-13 NOTE — Progress Notes (Addendum)
Subjective:  Patient ID: Natalie Mendoza, female    DOB: [birth year],  MRN: [id number]  Chief Complaint  Patient presents with  . Foot Pain    pt is here for a ulcer f/u of the right foot, pt states that she is doing alot better since the last time she was here, pt shows no signs of infection, pt is well bandaged and is also here for circulation results.    86 y.o. female presents for wound care.  Patient presents with a follow-up of right fourth digit ulceration distal tip.  Patient states that it is doing a lot better.  There is no clinical signs of infection.  They have been keeping it nice and dry with Betadine.  Patient also got this test result for circulation would like to discuss it further if there is any other management for it.  They denied any other acute complaints.  She denies any other acute complaints..   Review of Systems: Negative except as noted in the HPI. Denies N/V/F/Ch.  Past Medical History:  Diagnosis Date  . Arrhythmia    Atrial Fib  . Atrial fibrillation ([place])   . Breast cancer ([place])   . CHF (congestive heart failure) ([place])   . Chronic systolic CHF (congestive heart failure) ([place])   . Depression   . Hearing impairment   . Hx of joint problems   . Hypertension   . Osteoarthritis   . Osteoporosis   . Pelvic relaxation   . Stroke [facility])    fully resolved TIA  . Vaginal pessary present     Current Outpatient Medications:  .  amLODipine (NORVASC) 2.5 MG tablet, Take 2.5 mg by mouth daily., Disp: , Rfl:  .  amoxicillin-clavulanate (AUGMENTIN) 875-125 MG tablet, , Disp: , Rfl:  .  anastrozole (ARIMIDEX) 1 MG tablet, Take 1 mg by mouth daily., Disp: , Rfl:  .  apixaban (ELIQUIS) 5 MG TABS tablet, Take 1 tablet (5 mg total) by mouth 2 (two) times daily., Disp: 60 tablet, Rfl: 0 .  aspirin 81 MG chewable tablet, Chew 1 tablet (81 mg total) by mouth daily., Disp: 14 tablet, Rfl: 0 .  diltiazem (CARDIZEM SR) 60 MG 12 hr capsule, Take 1 capsule (60 mg total) by  mouth every 12 (twelve) hours., Disp: 60 capsule, Rfl: 0 .  DULoxetine (CYMBALTA) 30 MG capsule, Take 30 mg by mouth daily., Disp: , Rfl:  .  erythromycin ophthalmic ointment, 1 application at bedtime as needed., Disp: , Rfl:  .  estradiol (ESTRACE) 0.1 MG/GM vaginal cream, Place 1 Applicatorful vaginally 2 (two) times a week., Disp: , Rfl:  .  gabapentin (NEURONTIN) 300 MG capsule, , Disp: , Rfl:  .  spironolactone (ALDACTONE) 25 MG tablet, Take 25 mg by mouth daily., Disp: , Rfl:  .  valsartan (DIOVAN) 160 MG tablet, Take 80 mg by mouth daily. , Disp: , Rfl:  .  Vitamin D, Ergocalciferol, (DRISDOL) 1.25 MG (50000 UNIT) CAPS capsule, Take 50,000 Units by mouth every 7 (seven) days., Disp: , Rfl:  .  bisoprolol (ZEBETA) 10 MG tablet, Take 10 mg by mouth every evening. , Disp: , Rfl:  .  nitroGLYCERIN (NITRO-BID) 2 % ointment, Apply 0.5 inches topically 3 (three) times daily., Disp: 30 g, Rfl: 1  Social History   Tobacco Use  Smoking Status Never Smoker  Smokeless Tobacco Never Used    Allergies  Allergen Reactions  . Aspirin Other (See Comments)   Objective:  There were no vitals filed  for this visit. There is no height or weight on file to calculate BMI. Constitutional Well developed. Well nourished.  Vascular Dorsalis pedis pulses 1/4 palpable bilaterally. Posterior tibial pulses non palpable bilaterally. Capillary refill normal to all digits.  No cyanosis or clubbing noted. Pedal hair growth normal.  Neurologic Normal speech. Oriented to person, place, and time. Protective sensation absent  Dermatologic Wound Location: Right distal tip fourth digit ulceration.  No clinical signs of infection noted.  No malodor present.  No purulent drainage was expressed. Wound Base: Fibrotic slough Peri-wound: Calloused Exudate: Moderate amount Serosanguinous exudate Wound Measurements: -See below  Purpleish discoloration noted to the distal tip of all the toes.  There is edema component  of dependent rubor noted to bilateral lower extremity likely due to microvascular disease.  Hammertoe contractures noted to all the digits 2 through 5 bilaterally.  Orthopedic: No pain to palpation either foot.   Radiographs: 3 views of bilateral skeletally mature foot: No bony abnormalities identified.  Mild midfoot arthritis noted bilaterally.  No signs of osteomyelitis or soft tissue emphysema noted.  No signs of cortical irregularity or destruction noted.  Assessment:   1. Toe ulcer, right, with fat layer exposed ([place])   2. Foot pain, right   3. Small vessel disease ([place])   4. Raynaud's phenomenon without gangrene    Plan:  Patient was evaluated and treated and all questions answered.  Ulcer right distal tip fourth digit ulceration -Debridement as below. -Dressed with Betadine wet-to-dry 3 times a week, DSD. -Continue off-loading with surgical shoe. -Patient is a high risk of losing the fourth digit if the progression of the wound worsens.  I explained this to the patient and patient states understanding.  At this time I am not concerned for acute infection as there is no signs of bone infection as well as skin infection. -ABIs PVRs were reviewed in extensive detail with the patient.  It appears that patient circulation appears to be intact with good flow to the lower extremity.  However given the discoloration to the distal tip of the toes I believe there is a micro vessel disease associated with this.  I discussed this with the patient in extensive detail.  Mild hammertoe contractures bilaterally 2 through 5 -I explained to the patient the etiology of hammertoe contractures with associated ulceration in the setting of Raynaud's phenomena and various treatment options were discussed.  I believe patient will benefit from wider shoe gear modification and may be custom shoes.  Patient is a prediabetic and was interested in diabetic shoes or something similar. -I discussed that there may be  something that Liliane Channel may be able to discuss with you.  I will have her follow-up with Liliane Channel to discuss shoe gear modifications to allow for more wider toe box and space  Raynaud's phenomena -I explained the patient the etiology of Raynaud's phenomenon various treatment options were discussed.  Patient has had a past work-up of any autoimmune disease by previous rheumatologist.  They were not able to conclusively rule in lupus or any other autoimmune disease process.  At this time this could be likely due to primary Raynaud's phenomenon. -Nitroglycerin ointment was dispensed to help vasodilate the digital arteries.  Procedure: Excisional Debridement of Wound Tool: Sharp chisel blade/tissue nipper Rationale: Removal of non-viable soft tissue from the wound to promote healing.  Anesthesia: none Pre-Debridement Wound Measurements: 0.3 cm x 0.3 cm x 0.2 cm Post-Debridement Wound Measurements: 0.3 cm x 0.3 cm x 0.2 cm Type of  Debridement: Sharp Excisional Tissue Removed: Non-viable soft tissue Blood loss: Minimal (<50cc) Depth of Debridement: subcutaneous tissue. Technique: Sharp excisional debridement to bleeding, viable wound base.  Wound Progress: This is a first clinical visit.  I will continue to monitor the progression of the wound. Site healing conversation 7 Dressing: Dry, sterile, compression dressing. Disposition: Patient tolerated procedure well. Patient to return in 1 week for follow-up.  Return for See Liliane Channel for Diabetic shoes ASAP.

## 2019-12-04 ENCOUNTER — Ambulatory Visit: Payer: Medicare Other | Admitting: Orthotics

## 2019-12-04 ENCOUNTER — Other Ambulatory Visit: Payer: Self-pay

## 2019-12-04 DIAGNOSIS — L97512 Non-pressure chronic ulcer of other part of right foot with fat layer exposed: Secondary | ICD-10-CM

## 2019-12-04 DIAGNOSIS — M79671 Pain in right foot: Secondary | ICD-10-CM

## 2019-12-04 NOTE — Progress Notes (Signed)
Patient getting one pair of f/o and one pair of shoes.  Cash pay $250

## 2019-12-07 ENCOUNTER — Ambulatory Visit (INDEPENDENT_AMBULATORY_CARE_PROVIDER_SITE_OTHER): Payer: Medicare Other | Admitting: Podiatry

## 2019-12-07 ENCOUNTER — Other Ambulatory Visit: Payer: Self-pay

## 2019-12-07 DIAGNOSIS — M79671 Pain in right foot: Secondary | ICD-10-CM

## 2019-12-07 DIAGNOSIS — L97512 Non-pressure chronic ulcer of other part of right foot with fat layer exposed: Secondary | ICD-10-CM | POA: Diagnosis not present

## 2019-12-10 NOTE — Progress Notes (Signed)
Subjective:  Patient ID: Natalie Mendoza, female    DOB: May 27, 1933,  MRN: [id number]  Chief Complaint  Patient presents with  . Wound Check    pt is here for right foot wound care, pt states that the right foot is doing a lot better, pt states that the ointment she recieved has helped alot as well.    86 y.o. female presents for wound care.  Patient presents with a follow-up of right fourth digit ulceration distal tip.  Patient states that it is doing a lot better.  There is no clinical signs of infection.  They have been keeping it nice and dry with Betadine.  Patient also got this test result for circulation would like to discuss it further if there is any other management for it.  They denied any other acute complaints.  She denies any other acute complaints..   Review of Systems: Negative except as noted in the HPI. Denies N/V/F/Ch.  Past Medical History:  Diagnosis Date  . Arrhythmia    Atrial Fib  . Atrial fibrillation ([place])   . Breast cancer ([place])   . CHF (congestive heart failure) ([place])   . Chronic systolic CHF (congestive heart failure) ([place])   . Depression   . Hearing impairment   . Hx of joint problems   . Hypertension   . Osteoarthritis   . Osteoporosis   . Pelvic relaxation   . Stroke [facility])    fully resolved TIA  . Vaginal pessary present     Current Outpatient Medications:  .  amLODipine (NORVASC) 2.5 MG tablet, Take 2.5 mg by mouth daily., Disp: , Rfl:  .  amoxicillin-clavulanate (AUGMENTIN) 875-125 MG tablet, , Disp: , Rfl:  .  anastrozole (ARIMIDEX) 1 MG tablet, Take 1 mg by mouth daily., Disp: , Rfl:  .  apixaban (ELIQUIS) 5 MG TABS tablet, Take 1 tablet (5 mg total) by mouth 2 (two) times daily., Disp: 60 tablet, Rfl: 0 .  aspirin 81 MG chewable tablet, Chew 1 tablet (81 mg total) by mouth daily., Disp: 14 tablet, Rfl: 0 .  diltiazem (CARDIZEM SR) 60 MG 12 hr capsule, Take 1 capsule (60 mg total) by mouth every 12 (twelve) hours., Disp: 60 capsule, Rfl:  0 .  DULoxetine (CYMBALTA) 30 MG capsule, Take 30 mg by mouth daily., Disp: , Rfl:  .  erythromycin ophthalmic ointment, 1 application at bedtime as needed., Disp: , Rfl:  .  estradiol (ESTRACE) 0.1 MG/GM vaginal cream, Place 1 Applicatorful vaginally 2 (two) times a week., Disp: , Rfl:  .  gabapentin (NEURONTIN) 300 MG capsule, , Disp: , Rfl:  .  nitroGLYCERIN (NITRO-BID) 2 % ointment, Apply 0.5 inches topically 3 (three) times daily., Disp: 30 g, Rfl: 1 .  spironolactone (ALDACTONE) 25 MG tablet, Take 25 mg by mouth daily., Disp: , Rfl:  .  valsartan (DIOVAN) 160 MG tablet, Take 80 mg by mouth daily. , Disp: , Rfl:  .  Vitamin D, Ergocalciferol, (DRISDOL) 1.25 MG (50000 UNIT) CAPS capsule, Take 50,000 Units by mouth every 7 (seven) days., Disp: , Rfl:  .  bisoprolol (ZEBETA) 10 MG tablet, Take 10 mg by mouth every evening. , Disp: , Rfl:   Social History   Tobacco Use  Smoking Status Never Smoker  Smokeless Tobacco Never Used    Allergies  Allergen Reactions  . Aspirin Other (See Comments)   Objective:  There were no vitals filed for this visit. There is no height or weight on file to  calculate BMI. Constitutional Well developed. Well nourished.  Vascular Dorsalis pedis pulses 1/4 palpable bilaterally. Posterior tibial pulses non palpable bilaterally. Capillary refill normal to all digits.  No cyanosis or clubbing noted. Pedal hair growth normal.  Neurologic Normal speech. Oriented to person, place, and time. Protective sensation absent  Dermatologic Wound Location: Right distal tip fourth digit ulceration.  No clinical signs of infection noted.  No malodor present.  No purulent drainage was expressed. Wound Base: Fibrotic slough Peri-wound: Calloused Exudate: Moderate amount Serosanguinous exudate Wound Measurements: -See below  Purpleish discoloration noted to the distal tip of all the toes.  There is edema component of dependent rubor noted to bilateral lower extremity  likely due to microvascular disease.  Hammertoe contractures noted to all the digits 2 through 5 bilaterally.  Orthopedic: No pain to palpation either foot.   Radiographs: 3 views of bilateral skeletally mature foot: No bony abnormalities identified.  Mild midfoot arthritis noted bilaterally.  No signs of osteomyelitis or soft tissue emphysema noted.  No signs of cortical irregularity or destruction noted.  Assessment:   1. Toe ulcer, right, with fat layer exposed ([place])   2. Foot pain, right    Plan:  Patient was evaluated and treated and all questions answered.  Ulcer right distal tip fourth digit ulceration~improving -Debridement as below. -Dressed with Betadine wet-to-dry 3 times a week, DSD. -Continue off-loading with surgical shoe. -Patient is a high risk of losing the fourth digit if the progression of the wound worsens.  I explained this to the patient and patient states understanding.  At this time I am not concerned for acute infection as there is no signs of bone infection as well as skin infection. -ABIs PVRs were reviewed in extensive detail with the patient.  It appears that patient circulation appears to be intact with good flow to the lower extremity.  However given the discoloration to the distal tip of the toes I believe there is a micro vessel disease associated with this.  I discussed this with the patient in extensive detail. -  Mild hammertoe contractures bilaterally 2 through 5 -I explained to the patient the etiology of hammertoe contractures with associated ulceration in the setting of Raynaud's phenomena and various treatment options were discussed.  I believe patient will benefit from wider shoe gear modification and may be custom shoes.  Patient is a prediabetic and was interested in diabetic shoes or something similar. -I discussed that there may be something that Liliane Channel may be able to discuss with you.  I will have her follow-up with Liliane Channel to discuss shoe gear  modifications to allow for more wider toe box and space -I will plan on performing tenotomy of third and fourth digit in the near future given the contractures are putting excessive pressure to the tip of the toes.  Raynaud's phenomena -I explained the patient the etiology of Raynaud's phenomenon various treatment options were discussed.  Patient has had a past work-up of any autoimmune disease by previous rheumatologist.  They were not able to conclusively rule in lupus or any other autoimmune disease process.  At this time this could be likely due to primary Raynaud's phenomenon. -Nitroglycerin ointment was dispensed to help vasodilate the digital arteries.  Procedure: Excisional Debridement of Wound Tool: Sharp chisel blade/tissue nipper Rationale: Removal of non-viable soft tissue from the wound to promote healing.  Anesthesia: none Pre-Debridement Wound Measurements: 0.2 cm x 0.2 cm x 0.2 cm Post-Debridement Wound Measurements: 0.3 cm x 0.3 cm x 0.3  cm Type of Debridement: Sharp Excisional Tissue Removed: Non-viable soft tissue Blood loss: Minimal (<50cc) Depth of Debridement: subcutaneous tissue. Technique: Sharp excisional debridement to bleeding, viable wound base.  Wound Progress: This is a first clinical visit.  I will continue to monitor the progression of the wound. Site healing conversation 7 Dressing: Dry, sterile, compression dressing. Disposition: Patient tolerated procedure well. Patient to return in 1 week for follow-up.  No follow-ups on file.

## 2020-01-02 ENCOUNTER — Ambulatory Visit (INDEPENDENT_AMBULATORY_CARE_PROVIDER_SITE_OTHER): Payer: Medicare Other | Admitting: Podiatry

## 2020-01-02 ENCOUNTER — Other Ambulatory Visit: Payer: Self-pay

## 2020-01-02 DIAGNOSIS — L97512 Non-pressure chronic ulcer of other part of right foot with fat layer exposed: Secondary | ICD-10-CM

## 2020-01-02 DIAGNOSIS — M205X1 Other deformities of toe(s) (acquired), right foot: Secondary | ICD-10-CM

## 2020-01-02 DIAGNOSIS — N390 Urinary tract infection, site not specified: Secondary | ICD-10-CM | POA: Insufficient documentation

## 2020-01-02 DIAGNOSIS — N765 Ulceration of vagina: Secondary | ICD-10-CM | POA: Insufficient documentation

## 2020-01-02 DIAGNOSIS — I739 Peripheral vascular disease, unspecified: Secondary | ICD-10-CM

## 2020-01-03 ENCOUNTER — Encounter: Payer: Self-pay | Admitting: Podiatry

## 2020-01-03 NOTE — Progress Notes (Signed)
Subjective:  Patient ID: Natalie Mendoza, female    DOB: [birth year],  MRN: [id number]  Chief Complaint  Patient presents with  . Wound Check    Pt states healing well without any concerns. Denies fever/chills/nausea/vomiting. Pt states applied nitroglycerin ointment but not in past 3 days.     86 y.o. female presents for wound care.  Patient presents with a follow-up of right fourth digit distal tip ulceration.  Patient states is doing better.  She denies any constitutional symptoms.  She would like to discuss if tenotomy should be done today or not.  Enied any other acute complaints.  She denies any other acute complaints..   Review of Systems: Negative except as noted in the HPI. Denies N/V/F/Ch.  Past Medical History:  Diagnosis Date  . Arrhythmia    Atrial Fib  . Atrial fibrillation ([place])   . Breast cancer ([place])   . CHF (congestive heart failure) ([place])   . Chronic systolic CHF (congestive heart failure) ([place])   . Depression   . Hearing impairment   . Hx of joint problems   . Hypertension   . Osteoarthritis   . Osteoporosis   . Pelvic relaxation   . Stroke [facility])    fully resolved TIA  . Vaginal pessary present     Current Outpatient Medications:  .  amLODipine (NORVASC) 2.5 MG tablet, Take 2.5 mg by mouth daily., Disp: , Rfl:  .  amoxicillin-clavulanate (AUGMENTIN) 875-125 MG tablet, , Disp: , Rfl:  .  anastrozole (ARIMIDEX) 1 MG tablet, Take 1 mg by mouth daily., Disp: , Rfl:  .  apixaban (ELIQUIS) 5 MG TABS tablet, Take 1 tablet (5 mg total) by mouth 2 (two) times daily., Disp: 60 tablet, Rfl: 0 .  aspirin 81 MG chewable tablet, Chew 1 tablet (81 mg total) by mouth daily., Disp: 14 tablet, Rfl: 0 .  diltiazem (CARDIZEM SR) 60 MG 12 hr capsule, Take 1 capsule (60 mg total) by mouth every 12 (twelve) hours., Disp: 60 capsule, Rfl: 0 .  DULoxetine (CYMBALTA) 30 MG capsule, Take 30 mg by mouth daily., Disp: , Rfl:  .  erythromycin ophthalmic ointment, 1 application at  bedtime as needed., Disp: , Rfl:  .  estradiol (ESTRACE) 0.1 MG/GM vaginal cream, Place 1 Applicatorful vaginally 2 (two) times a week., Disp: , Rfl:  .  gabapentin (NEURONTIN) 300 MG capsule, , Disp: , Rfl:  .  nitroGLYCERIN (NITRO-BID) 2 % ointment, Apply 0.5 inches topically 3 (three) times daily., Disp: 30 g, Rfl: 1 .  spironolactone (ALDACTONE) 25 MG tablet, Take 25 mg by mouth daily., Disp: , Rfl:  .  valsartan (DIOVAN) 160 MG tablet, Take 80 mg by mouth daily. , Disp: , Rfl:  .  Vitamin D, Ergocalciferol, (DRISDOL) 1.25 MG (50000 UNIT) CAPS capsule, Take 50,000 Units by mouth every 7 (seven) days., Disp: , Rfl:  .  bisoprolol (ZEBETA) 10 MG tablet, Take 10 mg by mouth every evening. , Disp: , Rfl:   Social History   Tobacco Use  Smoking Status Never Smoker  Smokeless Tobacco Never Used    Allergies  Allergen Reactions  . Aspirin Other (See Comments)   Objective:  There were no vitals filed for this visit. There is no height or weight on file to calculate BMI. Constitutional Well developed. Well nourished.  Vascular Dorsalis pedis pulses 1/4 palpable bilaterally. Posterior tibial pulses non palpable bilaterally. Capillary refill normal to all digits.  No cyanosis or clubbing noted. Pedal hair growth normal.  Neurologic Normal speech. Oriented to person, place, and time. Protective sensation absent  Dermatologic Wound Location: Right distal tip fourth digit ulceration.  No clinical signs of infection noted.  No malodor present.  No purulent drainage was expressed. Wound Base: Fibrotic slough Peri-wound: Calloused Exudate: Moderate amount Serosanguinous exudate Wound Measurements: -See below  Purpleish discoloration noted to the distal tip of all the toes.  There is edema component of dependent rubor noted to bilateral lower extremity likely due to microvascular disease.  Hammertoe contractures noted to all the digits 2 through 5 bilaterally.  Pre-ulcerative callus at  the tip of the right, 4th toe  Semi-reducible hammertoe deformity right, 4th toe     Orthopedic: No pain to palpation either foot.   Radiographs: 3 views of bilateral skeletally mature foot: No bony abnormalities identified.  Mild midfoot arthritis noted bilaterally.  No signs of osteomyelitis or soft tissue emphysema noted.  No signs of cortical irregularity or destruction noted.  Assessment:   1. Contracture of toe of right foot   2. Small vessel disease ([place])   3. Toe ulcer, right, with fat layer exposed ([place])    Plan:  Patient was evaluated and treated and all questions answered.   Hammertoe fourth with pre-ulcerative callus -Flexor tenotomy as below. -Advised to remove the dressing in 24 hours and apply a band-aid and triple abx ointment every day thereafter.  Procedure: Flexor Tenotomy Indication for Procedure: toe with semi-reducible hammertoe with distal tip ulceration. Flexor tenotomy indicated to alleviate contracture, reduce pressure, and enhance healing of the ulceration. Location: right, 4th toe Anesthesia: Lidocaine 1% plain; 1.5 mL and Marcaine 0.5% plain; 1.5 mL digital block Instrumentation: 11 blade Technique: The toe was anesthetized as above and prepped in the usual fashion. The toe was exsanquinated and a tourniquet was secured at the base of the toe. An 11 blade was then used to percutaneously release the flexor tendon at the plantar surface of the toe with noted release of the hammertoe deformity. The incision was closed with 3-0 Prolene.  Then dressed with antibiotic ointment and band-aid. Compression splint dressing applied. Patient tolerated the procedure well. Dressing: Dry, sterile, compression dressing. Disposition: Patient tolerated procedure well. Patient to return in 1 week for follow-up.   Ulcer right distal tip fourth digit ulceration~slowly improving -Debridement as below. -Dressed with Betadine wet-to-dry 3 times a week, DSD. -Continue off-loading  with surgical shoe. -Patient is a high risk of losing the fourth digit if the progression of the wound worsens.  I explained this to the patient and patient states understanding.  At this time I am not concerned for acute infection as there is no signs of bone infection as well as skin infection. -ABIs PVRs were reviewed in extensive detail with the patient.  It appears that patient circulation appears to be intact with good flow to the lower extremity.  However given the discoloration to the distal tip of the toes I believe there is a micro vessel disease associated with this.  I discussed this with the patient in extensive detail. -  Mild hammertoe contractures bilaterally 2 through 5 -I explained to the patient the etiology of hammertoe contractures with associated ulceration in the setting of Raynaud's phenomena and various treatment options were discussed.  I believe patient will benefit from wider shoe gear modification and may be custom shoes.  Patient is a prediabetic and was interested in diabetic shoes or something similar. -Patient is awaiting specialized insole/shoes. -Flexor tenotomy was performed to the right  fourth digit as he continues to have nonhealing distal tip ulceration.  Raynaud's phenomena -I explained the patient the etiology of Raynaud's phenomenon various treatment options were discussed.  Patient has had a past work-up of any autoimmune disease by previous rheumatologist.  They were not able to conclusively rule in lupus or any other autoimmune disease process.  At this time this could be likely due to primary Raynaud's phenomenon. -Nitroglycerin ointment was dispensed to help vasodilate the digital arteries.  Procedure: Excisional Debridement of Wound Tool: Sharp chisel blade/tissue nipper Rationale: Removal of non-viable soft tissue from the wound to promote healing.  Anesthesia: none Pre-Debridement Wound Measurements: 0.2 cm x 0.2 cm x 0.2 cm Post-Debridement Wound  Measurements: 0.3 cm x 0.3 cm x 0.3 cm Type of Debridement: Sharp Excisional Tissue Removed: Non-viable soft tissue Blood loss: Minimal (<50cc) Depth of Debridement: subcutaneous tissue. Technique: Sharp excisional debridement to bleeding, viable wound base.  Wound Progress: This is a first clinical visit.  I will continue to monitor the progression of the wound. Site healing conversation 7 Dressing: Dry, sterile, compression dressing. Disposition: Patient tolerated procedure well. Patient to return in 1 week for follow-up.  No follow-ups on file.

## 2020-01-16 ENCOUNTER — Other Ambulatory Visit: Payer: Self-pay

## 2020-01-16 ENCOUNTER — Ambulatory Visit (INDEPENDENT_AMBULATORY_CARE_PROVIDER_SITE_OTHER): Payer: Medicare Other | Admitting: Podiatry

## 2020-01-16 DIAGNOSIS — L97512 Non-pressure chronic ulcer of other part of right foot with fat layer exposed: Secondary | ICD-10-CM

## 2020-01-16 DIAGNOSIS — M205X1 Other deformities of toe(s) (acquired), right foot: Secondary | ICD-10-CM

## 2020-01-16 DIAGNOSIS — I739 Peripheral vascular disease, unspecified: Secondary | ICD-10-CM

## 2020-01-17 ENCOUNTER — Encounter: Payer: Self-pay | Admitting: Podiatry

## 2020-01-17 NOTE — Progress Notes (Signed)
Subjective:  Patient ID: Natalie Mendoza, female    DOB: April 09, 1933,  MRN: [id number]  Chief Complaint  Patient presents with  . Wound Check    pt is here for a 2 week wound check of the right foot. Pt states that it is looking a lot better since the last time she was here. Pt also states that a potential suture went out as well.    86 y.o. female presents for wound care.  Patient presents with a follow-up of right distal tip ulceration status post flexor tenotomy.  Patient states she is doing really well overall.  No pain.  The correction of the fourth digit has considerably improved the pressure to the distal fourth.   Review of Systems: Negative except as noted in the HPI. Denies N/V/F/Ch.  Past Medical History:  Diagnosis Date  . Arrhythmia    Atrial Fib  . Atrial fibrillation ([place])   . Breast cancer ([place])   . CHF (congestive heart failure) ([place])   . Chronic systolic CHF (congestive heart failure) ([place])   . Depression   . Hearing impairment   . Hx of joint problems   . Hypertension   . Osteoarthritis   . Osteoporosis   . Pelvic relaxation   . Stroke [facility])    fully resolved TIA  . Vaginal pessary present     Current Outpatient Medications:  .  amLODipine (NORVASC) 2.5 MG tablet, Take 2.5 mg by mouth daily., Disp: , Rfl:  .  amoxicillin-clavulanate (AUGMENTIN) 875-125 MG tablet, , Disp: , Rfl:  .  anastrozole (ARIMIDEX) 1 MG tablet, Take 1 mg by mouth daily., Disp: , Rfl:  .  apixaban (ELIQUIS) 5 MG TABS tablet, Take 1 tablet (5 mg total) by mouth 2 (two) times daily., Disp: 60 tablet, Rfl: 0 .  aspirin 81 MG chewable tablet, Chew 1 tablet (81 mg total) by mouth daily., Disp: 14 tablet, Rfl: 0 .  diltiazem (CARDIZEM SR) 60 MG 12 hr capsule, Take 1 capsule (60 mg total) by mouth every 12 (twelve) hours., Disp: 60 capsule, Rfl: 0 .  DULoxetine (CYMBALTA) 30 MG capsule, Take 30 mg by mouth daily., Disp: , Rfl:  .  erythromycin ophthalmic ointment, 1 application at bedtime  as needed., Disp: , Rfl:  .  estradiol (ESTRACE) 0.1 MG/GM vaginal cream, Place 1 Applicatorful vaginally 2 (two) times a week., Disp: , Rfl:  .  gabapentin (NEURONTIN) 300 MG capsule, , Disp: , Rfl:  .  nitroGLYCERIN (NITRO-BID) 2 % ointment, Apply 0.5 inches topically 3 (three) times daily., Disp: 30 g, Rfl: 1 .  spironolactone (ALDACTONE) 25 MG tablet, Take 25 mg by mouth daily., Disp: , Rfl:  .  valsartan (DIOVAN) 160 MG tablet, Take 80 mg by mouth daily. , Disp: , Rfl:  .  Vitamin D, Ergocalciferol, (DRISDOL) 1.25 MG (50000 UNIT) CAPS capsule, Take 50,000 Units by mouth every 7 (seven) days., Disp: , Rfl:  .  bisoprolol (ZEBETA) 10 MG tablet, Take 10 mg by mouth every evening. , Disp: , Rfl:   Social History   Tobacco Use  Smoking Status Never Smoker  Smokeless Tobacco Never Used    Allergies  Allergen Reactions  . Aspirin Other (See Comments)   Objective:  There were no vitals filed for this visit. There is no height or weight on file to calculate BMI. Constitutional Well developed. Well nourished.  Vascular Dorsalis pedis pulses 1/4 palpable bilaterally. Posterior tibial pulses non palpable bilaterally. Capillary refill normal to all digits.  No cyanosis or clubbing noted. Pedal hair growth normal.  Neurologic Normal speech. Oriented to person, place, and time. Protective sensation absent  Dermatologic  right distal tip for ulceration.  Completely reepithelialized.  Mild callus formation which was debrided down.  No underlying ulceration noted.  Purpleish discoloration noted to the distal tip of all the toes.  There is edema component of dependent rubor noted to bilateral lower extremity likely due to microvascular disease.  Hammertoe contractures noted to all the digits 2 through 5 bilaterally.    Good correction alignment and reduction of hammertoe contracture noted to the right fourth digit    Orthopedic: No pain to palpation either foot.   Radiographs: 3 views of  bilateral skeletally mature foot: No bony abnormalities identified.  Mild midfoot arthritis noted bilaterally.  No signs of osteomyelitis or soft tissue emphysema noted.  No signs of cortical irregularity or destruction noted.  Assessment:   1. Contracture of toe of right foot   2. Small vessel disease ([place])   3. Toe ulcer, right, with fat layer exposed ([place])    Plan:  Patient was evaluated and treated and all questions answered.   Hammertoe fourth with pre-ulcerative callus status post flexor tenotomy -Sutures were removed.  No complication noted.  The incision site is completely healed.  No clinical signs of infection noted. -Rectus correction of the fourth digit noted without distal pressure to the tip of the toe.  The wound ulcer has also completely reepithelialized.   Ulcer right distal tip fourth digit ulceration -Completely reepithelialized no further ulceration noted upon debridement of the hyperkeratotic lesion. -  Mild hammertoe contractures bilaterally 2 through 5 -I explained to the patient the etiology of hammertoe contractures with associated ulceration in the setting of Raynaud's phenomena and various treatment options were discussed.  I believe patient will benefit from wider shoe gear modification and may be custom shoes.  Patient is a prediabetic and was interested in diabetic shoes or something similar. -Patient is awaiting specialized insole/shoes. -Flexor tenotomy was performed to the right fourth digit as he continues to have nonhealing distal tip ulceration.  Raynaud's phenomena -I explained the patient the etiology of Raynaud's phenomenon various treatment options were discussed.  Patient has had a past work-up of any autoimmune disease by previous rheumatologist.  They were not able to conclusively rule in lupus or any other autoimmune disease process.  At this time this could be likely due to primary Raynaud's phenomenon. -Nitroglycerin ointment was dispensed to  help vasodilate the digital arteries.    No follow-ups on file.

## 2020-03-08 ENCOUNTER — Emergency Department (HOSPITAL_BASED_OUTPATIENT_CLINIC_OR_DEPARTMENT_OTHER): Payer: Medicare Other

## 2020-03-08 ENCOUNTER — Emergency Department (HOSPITAL_BASED_OUTPATIENT_CLINIC_OR_DEPARTMENT_OTHER)
Admission: EM | Admit: 2020-03-08 | Discharge: 2020-03-08 | Disposition: A | Discharge status: Home / Self Care | Payer: Medicare Other | Attending: Emergency Medicine | Admitting: Emergency Medicine

## 2020-03-08 ENCOUNTER — Encounter (HOSPITAL_BASED_OUTPATIENT_CLINIC_OR_DEPARTMENT_OTHER): Payer: Self-pay | Admitting: Emergency Medicine

## 2020-03-08 DIAGNOSIS — I5022 Chronic systolic (congestive) heart failure: Secondary | ICD-10-CM | POA: Insufficient documentation

## 2020-03-08 DIAGNOSIS — M25531 Pain in right wrist: Secondary | ICD-10-CM | POA: Diagnosis not present

## 2020-03-08 DIAGNOSIS — G459 Transient cerebral ischemic attack, unspecified: Secondary | ICD-10-CM | POA: Insufficient documentation

## 2020-03-08 DIAGNOSIS — Z7901 Long term (current) use of anticoagulants: Secondary | ICD-10-CM | POA: Diagnosis not present

## 2020-03-08 DIAGNOSIS — E1159 Type 2 diabetes mellitus with other circulatory complications: Secondary | ICD-10-CM | POA: Insufficient documentation

## 2020-03-08 DIAGNOSIS — S8002XA Contusion of left knee, initial encounter: Secondary | ICD-10-CM | POA: Insufficient documentation

## 2020-03-08 DIAGNOSIS — Z853 Personal history of malignant neoplasm of breast: Secondary | ICD-10-CM | POA: Diagnosis not present

## 2020-03-08 DIAGNOSIS — Z79899 Other long term (current) drug therapy: Secondary | ICD-10-CM | POA: Insufficient documentation

## 2020-03-08 DIAGNOSIS — G939 Disorder of brain, unspecified: Secondary | ICD-10-CM | POA: Insufficient documentation

## 2020-03-08 DIAGNOSIS — S50312A Abrasion of left elbow, initial encounter: Secondary | ICD-10-CM | POA: Diagnosis not present

## 2020-03-08 DIAGNOSIS — I110 Hypertensive heart disease with heart failure: Secondary | ICD-10-CM | POA: Diagnosis not present

## 2020-03-08 DIAGNOSIS — Z7982 Long term (current) use of aspirin: Secondary | ICD-10-CM | POA: Insufficient documentation

## 2020-03-08 DIAGNOSIS — W541XXA Struck by dog, initial encounter: Secondary | ICD-10-CM | POA: Diagnosis not present

## 2020-03-08 DIAGNOSIS — M25522 Pain in left elbow: Secondary | ICD-10-CM

## 2020-03-08 DIAGNOSIS — Z96652 Presence of left artificial knee joint: Secondary | ICD-10-CM | POA: Insufficient documentation

## 2020-03-08 DIAGNOSIS — S8992XA Unspecified injury of left lower leg, initial encounter: Secondary | ICD-10-CM | POA: Diagnosis present

## 2020-03-08 DIAGNOSIS — Y93K1 Activity, walking an animal: Secondary | ICD-10-CM | POA: Diagnosis not present

## 2020-03-08 DIAGNOSIS — W19XXXA Unspecified fall, initial encounter: Secondary | ICD-10-CM

## 2020-03-08 DIAGNOSIS — M25562 Pain in left knee: Secondary | ICD-10-CM

## 2020-03-08 NOTE — Discharge Instructions (Signed)
Take Tylenol as needed for pain of your wrist, elbow, or knee. Use ice to help with pain and swelling. Follow-up with Dr. Fredna Dow in 1 week as needed if you continue to have swelling and pain of your right wrist. Keep the wrist splint on until your symptoms have completely resolved. Return to the emergency room if you develop severe worsening headache, difficulty walking, persistent vomiting, new numbness, or any new, worsening, or concerning symptoms.

## 2020-03-08 NOTE — ED Provider Notes (Signed)
Natalie Mendoza Provider Note   CSN: [id number] Arrival date & time: 03/08/20  0913     History Chief Complaint  Patient presents with  . Fall    Natalie Mendoza is a 86 y.o. female presenting for evaluation after a fall.  Patient states she was walking her dog last night when she got pulled, fell onto her left side.  She not hit her head or lose consciousness.  She reports a lot of pain of her right wrist, minimal pain of her left knee.  She has not taken anything for pain.  She has been able to ambulate today.  She denies headache, neck pain, back pain, chest pain, nausea, vomiting, loss of bowel bladder control, numbness, tingling.  Patient is on Eliquis, last dose was last night.  She recently had a left knee replacement.  Patient also states she is seeing Danley Danker the past for this problems, if she needs and follow-up requests to follow-up with him.  HPI     Past Medical History:  Diagnosis Date  . Arrhythmia    Atrial Fib  . Atrial fibrillation ([place])   . Breast cancer ([place])   . CHF (congestive heart failure) ([place])   . Chronic systolic CHF (congestive heart failure) ([place])   . Depression   . Hearing impairment   . Hx of joint problems   . Hypertension   . Osteoarthritis   . Osteoporosis   . Pelvic relaxation   . Stroke [facility]    fully resolved TIA  . Vaginal pessary present     Patient Active Problem List   Diagnosis Date Noted  . Recurrent urinary tract infection 01/02/2020  . Vaginal ulcer 01/02/2020  . Chronic venous insufficiency 10/05/2019  . Type 2 diabetes mellitus without complication, without long-term current use of insulin ([place]) 08/15/2019  . Vitamin D deficiency 07/24/2019  . Status post left knee replacement 03/22/2019  . Hypomagnesemia 03/09/2019  . Acute postoperative pain 03/06/2019  . SOB (shortness of breath) 12/15/2017  . Hyperlipidemia   . Sore throat   . Chronic atrial fibrillation ([place]) 08/01/2016  .  Chronic systolic CHF (congestive heart failure) ([place]) 08/01/2016  . Abdominal distension 11/23/2015  . Nuclear sclerotic cataract of both eyes 03/29/2014  . 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/29/2014  . Osteoporosis with fracture 08/28/2013  . Left-sided weakness 03/13/2013  . TIA (transient ischemic attack) 03/13/2013  . HTN (hypertension) 03/13/2013  . Dyslipidemia 03/13/2013  . Depression 03/13/2013  . Breast cancer ([place]) 09/01/2012  . Deafness, sensorineural 07/31/2012  . Hearing aid worn 07/31/2012  . Other seborrheic keratosis 04/05/2011  . Xerosis cutis 04/05/2011  . Anxiety 03/17/2011  . Sleep related leg cramps 12/06/2007  . Major depressive disorder, single episode 12/03/2007  . Arthropathia 11/28/2007  . Age-related osteoporosis without current pathological fracture 11/28/2007  . Asymptomatic varicose veins 11/28/2007  . Pain in limb 11/28/2007  . Swelling of limb 11/28/2007    Past Surgical History:  Procedure Laterality Date  . BREAST LUMPECTOMY    . CATARACT EXTRACTION W/ INTRAOCULAR LENS  IMPLANT, BILATERAL Bilateral   . REPLACEMENT TOTAL KNEE     left     OB History    Gravida  2   Para  2   Term      Preterm      AB      Living        SAB      TAB      Ectopic  Multiple      Live Births              Family History  Problem Relation Age of Onset  . Hypertension Mother   . Stroke Mother   . Ovarian cancer Maternal Grandmother     Social History   Tobacco Use  . Smoking status: Never Smoker  . Smokeless tobacco: Never Used  Vaping Use  . Vaping Use: Never used  Substance Use Topics  . Alcohol use: No  . Drug use: No    Home Medications Prior to Admission medications   Medication Sig Start Date End Date Taking? Authorizing Provider  amLODipine (NORVASC) 2.5 MG tablet Take 2.5 mg by mouth daily.    [provider]  amoxicillin-clavulanate (AUGMENTIN) 875-125 MG tablet  10/23/19   [provider]    anastrozole (ARIMIDEX) 1 MG tablet Take 1 mg by mouth daily.    [provider]  apixaban (ELIQUIS) 5 MG TABS tablet Take 1 tablet (5 mg total) by mouth 2 (two) times daily. 03/15/13   Monika Salk, MD  aspirin 81 MG chewable tablet Chew 1 tablet (81 mg total) by mouth daily. 09/29/19   Little, Wenda Overland, MD  bisoprolol (ZEBETA) 10 MG tablet Take 10 mg by mouth every evening.  12/05/14 04/14/19  [provider]  diltiazem (CARDIZEM SR) 60 MG 12 hr capsule Take 1 capsule (60 mg total) by mouth every 12 (twelve) hours. 03/15/13   Monika Salk, MD  DULoxetine (CYMBALTA) 30 MG capsule Take 30 mg by mouth daily.    [provider]  erythromycin ophthalmic ointment 1 application at bedtime as needed.    [provider]  estradiol (ESTRACE) 0.1 MG/GM vaginal cream Place 1 Applicatorful vaginally 2 (two) times a week.    [provider]  gabapentin (NEURONTIN) 300 MG capsule  10/23/19   [provider]  nitroGLYCERIN (NITRO-BID) 2 % ointment Apply 0.5 inches topically 3 (three) times daily. 11/13/19   Felipa Furnace, DPM  spironolactone (ALDACTONE) 25 MG tablet Take 25 mg by mouth daily.    [provider]  valsartan (DIOVAN) 160 MG tablet Take 80 mg by mouth daily.     [provider]  Vitamin D, Ergocalciferol, (DRISDOL) 1.25 MG (50000 UNIT) CAPS capsule Take 50,000 Units by mouth every 7 (seven) days.    [provider]    Allergies    Aspirin  Review of Systems   Review of Systems  Musculoskeletal: Positive for arthralgias.  Hematological: Bruises/bleeds easily.  All other systems reviewed and are negative.   Physical Exam Updated Vital Signs BP (!) 144/83 (BP Location: Left Arm)   Pulse (!) 51   Temp 98.1 F (36.7 C) (Oral)   Resp 18   Ht 5' 5.5" (1.664 m)   Wt 64.9 kg   SpO2 94%   BMI 23.43 kg/m   Physical Exam Vitals and nursing note reviewed.  Constitutional:      General: She is not in acute  distress.    Appearance: She is well-developed.     Comments: Appears nontoxic  HENT:     Head: Normocephalic and atraumatic.     Comments: No sign of head trauma Eyes:     Extraocular Movements: Extraocular movements intact.     Conjunctiva/sclera: Conjunctivae normal.     Pupils: Pupils are equal, round, and reactive to light.  Neck:     Comments: No ttp of midline c-spine Cardiovascular:  Rate and Rhythm: Normal rate and regular rhythm.     Pulses: Normal pulses.  Pulmonary:     Effort: Pulmonary effort is normal. No respiratory distress.     Breath sounds: Normal breath sounds. No wheezing.  Abdominal:     General: There is no distension.     Palpations: Abdomen is soft. There is no mass.     Tenderness: There is no abdominal tenderness. There is no guarding or rebound.  Musculoskeletal:        General: Swelling present.     Cervical back: Normal range of motion and neck supple.     Comments: Swelling and tenderness of the right wrist, mostly over the radial aspect.  No bruising.  Radial pulse 2+ bilaterally.  No tenderness palpation of the hand or fingers.  Good distal sensation and cap refill.  Full active range of motion of the elbow and shoulder. Superficial abrasion and mild swelling of the left elbow over the olecranon process.  Full active range of motion of the elbow without difficulty.  No signs of injury, deformity, and no pain elsewhere of the left upper extremity. Bruising of the anterior left knee with minimal swelling.  Full active range of motion the knee without pain.  No tenderness palpation elsewhere the lower extremities.  Pedal pulses 2+ bilaterally. No TTP of back or midline spine.  No signs of injury or bruising.  Skin:    General: Skin is warm and dry.     Capillary Refill: Capillary refill takes less than 2 seconds.  Neurological:     Mental Status: She is alert and oriented to person, place, and time.     ED Results / Procedures / Treatments     Labs (all labs ordered are listed, but only abnormal results are displayed) Labs Reviewed - No data to display  EKG None  Radiology DG Elbow Complete Left  Result Date: 03/08/2020 CLINICAL DATA:  Fall EXAM: LEFT ELBOW - COMPLETE 3+ VIEW COMPARISON:  None. FINDINGS: Osteopenia. No acute fracture or dislocation. Joint spaces and alignment are maintained. No area of erosion or osseous destruction. No unexpected radiopaque foreign body. Soft tissues are unremarkable. IMPRESSION: No acute osseous abnormality in the left elbow. Electronically Signed   By: Valentino Saxon MD   On: 03/08/2020 10:55   DG Wrist Complete Right  Result Date: 03/08/2020 CLINICAL DATA:  Fall EXAM: RIGHT WRIST - COMPLETE 3+ VIEW COMPARISON:  August 08, 2013 FINDINGS: Osteopenia. No acute fracture or dislocation. Degenerative changes of the carpal joints. No area of erosion or osseous destruction. No unexpected radiopaque foreign body. Soft tissues are unremarkable. IMPRESSION: 1. No acute fracture or dislocation. 2. If persistent clinical concern for scaphoid fracture, recommend immobilization and follow-up radiographs in 2 weeks versus MRI. Electronically Signed   By: Valentino Saxon MD   On: 03/08/2020 10:53   CT Head Wo Contrast  Result Date: 03/08/2020 CLINICAL DATA:  Head trauma.  Fell last night while walking. EXAM: CT HEAD WITHOUT CONTRAST TECHNIQUE: Contiguous axial images were obtained from the base of the skull through the vertex without intravenous contrast. COMPARISON:  08/01/2016 FINDINGS: Brain: No evidence of acute infarction, hemorrhage, or hydrocephalus. There is a focal lesion associated with the left frontal lobe measuring 0.9 x 0.8 by 0.9 cm. It is difficult to tell if this is intra or extra-axial. On the CT from 01/06/2016 this measured 0.7 by 0.5 by 0.6 cm. No surrounding edema. Encephalomalacia noted within the right parieto-occipital lobe compatible with  remote infarct. There is also an old  infarct involving the inferior right frontal lobe, image number 8/2. Prominence of the ventricles and sulci compatible with age related brain atrophy. Vascular: No hyperdense vessel or unexpected calcification. Skull: Normal. Negative for fracture or focal lesion. Sinuses/Orbits: No acute finding. Other: None IMPRESSION: 1. No acute intracranial abnormalities. 2. Chronic small vessel ischemic disease and brain atrophy. 3. Chronic right frontal and right parieto-occipital lobe infarcts. 4. Left frontal lobe lesion is identified and appears mildly increased in size from 01/06/2016. It is difficult to tell if this is intra or extra-axial. Consider further evaluation with nonemergent, contrast enhanced MRI of the brain. Electronically Signed   By: Kerby Moors M.D.   On: 03/08/2020 11:39   DG Knee Complete 4 Views Left  Result Date: 03/08/2020 CLINICAL DATA:  Fall EXAM: LEFT KNEE - COMPLETE 4+ VIEW COMPARISON:  None. FINDINGS: Status post total knee arthroplasty. Orthopedic hardware is intact and without periprosthetic fracture or lucency. Osteopenia. No acute fracture or dislocation. Soft tissue edema overlying the patella. No joint effusion. IMPRESSION: 1. Soft tissue edema overlying the patella. No acute fracture or dislocation. 2. Status post total knee arthroplasty without complication. Electronically Signed   By: Valentino Saxon MD   On: 03/08/2020 10:54    Procedures Procedures (including critical care time)  Medications Ordered in ED Medications - No data to display  ED Course  I have reviewed the triage vital signs and the nursing notes.  Pertinent labs & imaging results that were available during my care of the patient were reviewed by me and considered in my medical decision making (see chart for details).    MDM Rules/Calculators/A&P                          Patient presenting for evaluation after mechanical yesterday.  On exam, patient has multiple areas of injury, including the  right wrist, left elbow, and left knee.  Obtain x-ray of those locations.  She is neurovascularly intact.  No reports of head trauma, however considering patient's age and that she is on a blood thinner, will obtain CT head for further evaluation.  X-rays viewed interpreted by me, no fracture dislocation.  However right wrist is extremely swollen and tender on the medial aspect.  Will place in a thumb spica and have patient follow-up with hand if swelling and pain does not improve.  CT head negative for acute findings such as bleed, however does show slight growth of left frontal lesion which was noted in 2017.  I discussed findings with patient and daughter.  Discussed follow-up with PCP, may need outpatient MRI.  Discussed symptomatic treatment with Tylenol and ice.  At this time, patient appears safe for discharge.  Return precautions given.  Patient and daughter state they understand and agree to plan.  Final Clinical Impression(s) / ED Diagnoses Final diagnoses:  Right wrist pain  Acute pain of left knee  Left elbow pain  Fall, initial encounter  Lesion of left frontal lobe of brain    Rx / DC Orders ED Discharge Orders    None       Franchot Heidelberg, PA-C 03/08/20 1156    Charlesetta Shanks, MD 03/09/20 [phone]

## 2020-03-08 NOTE — ED Notes (Signed)
Wrist with thumb spica-per PA

## 2020-03-08 NOTE — ED Provider Notes (Signed)
Medical screening examination/treatment/procedure(s) were conducted as a shared visit with non-physician practitioner(s) and myself.  I personally evaluated the patient during the encounter.    Patient was walking her dog yesterday evening and got pulled to the left.  She was not able to maintain her balance.  She reports she fell and has pain in the right wrist and has bruised and scraped the left knee.  She denies that she got knocked out.  She has been able to ambulate today.  Patient does take Eliquis.  Alert and nontoxic.  Mental status clear.  All movements coordinated purposeful symmetric.  No respiratory distress.  Deformity of the right wrist mild, may be chronic arthritic, neurovascularly intact.  Tender to palpation over bony prominences.  Moderate swelling of anterior left knee.  Superficial abrasions.  Range of motion intact.  Agree with plan and management.   Charlesetta Shanks, MD 03/09/20 [phone]

## 2020-03-08 NOTE — ED Notes (Signed)
Greenish discoloration to right hand just below the thumb.

## 2020-03-08 NOTE — ED Triage Notes (Signed)
She fell last night while walking the dog. States she did not hit her head, denies LOC. C/o R wrist pain, L knee and L elbow pain. She takes Eliquis.

## 2020-03-08 NOTE — ED Notes (Signed)
Bandages with bacitracin applied to knee and wrist wounds, wrapped with kerlix.

## 2020-05-06 ENCOUNTER — Ambulatory Visit (INDEPENDENT_AMBULATORY_CARE_PROVIDER_SITE_OTHER): Payer: Medicare Other | Admitting: Orthotics

## 2020-05-06 ENCOUNTER — Other Ambulatory Visit: Payer: Self-pay

## 2020-05-06 DIAGNOSIS — L97512 Non-pressure chronic ulcer of other part of right foot with fat layer exposed: Secondary | ICD-10-CM

## 2020-05-06 DIAGNOSIS — I739 Peripheral vascular disease, unspecified: Secondary | ICD-10-CM

## 2020-05-06 DIAGNOSIS — M205X1 Other deformities of toe(s) (acquired), right foot: Secondary | ICD-10-CM

## 2020-05-06 NOTE — Progress Notes (Signed)
Patient paid for f/o but will get shoes at [employer].

## 2020-05-28 ENCOUNTER — Ambulatory Visit: Payer: Medicare Other | Admitting: Podiatry

## 2020-07-09 ENCOUNTER — Encounter: Payer: Self-pay | Admitting: Podiatry

## 2020-07-09 ENCOUNTER — Other Ambulatory Visit: Payer: Self-pay

## 2020-07-09 ENCOUNTER — Ambulatory Visit (INDEPENDENT_AMBULATORY_CARE_PROVIDER_SITE_OTHER): Payer: Medicare Other | Admitting: Podiatry

## 2020-07-09 DIAGNOSIS — M2041 Other hammer toe(s) (acquired), right foot: Secondary | ICD-10-CM | POA: Diagnosis not present

## 2020-07-09 NOTE — Progress Notes (Signed)
Subjective:  Patient ID: Natalie Mendoza, female    DOB: 1933/02/01,  MRN: [id number]  Chief Complaint  Patient presents with  . Hammer Toe    Right foot 3rd toe. PT stated that she does not have any pain they would just like to know what options they have.    87 y.o. female presents for wound care.  Patient presents with complaint of right third digit hammertoe contracture.  The fourth toe is doing really well.  They just wanted to discuss options of what to do conservative as well as surgical.  She denies any pain to the area.  She denies any other acute complaints   Review of Systems: Negative except as noted in the HPI. Denies N/V/F/Ch.  Past Medical History:  Diagnosis Date  . Arrhythmia    Atrial Fib  . Atrial fibrillation ([place])   . Breast cancer ([place])   . CHF (congestive heart failure) ([place])   . Chronic systolic CHF (congestive heart failure) ([place])   . Depression   . Hearing impairment   . Hx of joint problems   . Hypertension   . Osteoarthritis   . Osteoporosis   . Pelvic relaxation   . Stroke [facility])    fully resolved TIA  . Vaginal pessary present     Current Outpatient Medications:  .  nitrofurantoin, macrocrystal-monohydrate, (MACROBID) 100 MG capsule, 1 capsule at bedtime with food, Disp: , Rfl:  .  amLODipine (NORVASC) 2.5 MG tablet, Take 2.5 mg by mouth daily., Disp: , Rfl:  .  amoxicillin (AMOXIL) 500 MG capsule, Take 1,000 mg by mouth 2 (two) times daily., Disp: , Rfl:  .  amoxicillin-clavulanate (AUGMENTIN) 875-125 MG tablet, , Disp: , Rfl:  .  anastrozole (ARIMIDEX) 1 MG tablet, Take 1 mg by mouth daily., Disp: , Rfl:  .  apixaban (ELIQUIS) 5 MG TABS tablet, Take 1 tablet (5 mg total) by mouth 2 (two) times daily., Disp: 60 tablet, Rfl: 0 .  aspirin 81 MG chewable tablet, Chew 1 tablet (81 mg total) by mouth daily., Disp: 14 tablet, Rfl: 0 .  bisoprolol (ZEBETA) 10 MG tablet, Take 10 mg by mouth every evening. , Disp: , Rfl:  .  bisoprolol (ZEBETA) 5 MG  tablet, , Disp: , Rfl:  .  Cholecalciferol 25 MCG (1000 UT) tablet, Take by mouth., Disp: , Rfl:  .  denosumab (PROLIA) 60 MG/ML SOSY injection, 60 mg, Disp: , Rfl:  .  diltiazem (CARDIZEM SR) 60 MG 12 hr capsule, Take 1 capsule (60 mg total) by mouth every 12 (twelve) hours., Disp: 60 capsule, Rfl: 0 .  DULoxetine (CYMBALTA) 30 MG capsule, Take 30 mg by mouth daily., Disp: , Rfl:  .  erythromycin ophthalmic ointment, 1 application at bedtime as needed., Disp: , Rfl:  .  estradiol (ESTRACE) 0.1 MG/GM vaginal cream, Place 1 Applicatorful vaginally 2 (two) times a week., Disp: , Rfl:  .  gabapentin (NEURONTIN) 300 MG capsule, , Disp: , Rfl:  .  nitrofurantoin, macrocrystal-monohydrate, (MACROBID) 100 MG capsule, , Disp: , Rfl:  .  nitroGLYCERIN (NITRO-BID) 2 % ointment, Apply 0.5 inches topically 3 (three) times daily., Disp: 30 g, Rfl: 1 .  spironolactone (ALDACTONE) 25 MG tablet, Take 25 mg by mouth daily., Disp: , Rfl:  .  valsartan (DIOVAN) 160 MG tablet, Take 80 mg by mouth daily. , Disp: , Rfl:  .  Vitamin D, Ergocalciferol, (DRISDOL) 1.25 MG (50000 UNIT) CAPS capsule, Take 50,000 Units by mouth every 7 (seven) days.,  Disp: , Rfl:   Social History   Tobacco Use  Smoking Status Never Smoker  Smokeless Tobacco Never Used    Allergies  Allergen Reactions  . Aspirin Other (See Comments)   Objective:  There were no vitals filed for this visit. There is no height or weight on file to calculate BMI. Constitutional Well developed. Well nourished.  Vascular Dorsalis pedis pulses 1/4 palpable bilaterally. Posterior tibial pulses non palpable bilaterally. Capillary refill normal to all digits.  No cyanosis or clubbing noted. Pedal hair growth normal.  Neurologic Normal speech. Oriented to person, place, and time. Protective sensation absent  Dermatologic  right fourth digit distal tip ulceration completely epithelialized.  Semiflexible contracture noted to the right third hammertoe  without any callus formation or ulceration.  Purpleish discoloration noted to the distal tip of all the toes.  There is edema component of dependent rubor noted to bilateral lower extremity likely due to microvascular disease.  Hammertoe contractures noted to all the digits 2 through 5 bilaterally.        Orthopedic: No pain to palpation either foot.   Radiographs: 3 views of bilateral skeletally mature foot: No bony abnormalities identified.  Mild midfoot arthritis noted bilaterally.  No signs of osteomyelitis or soft tissue emphysema noted.  No signs of cortical irregularity or destruction noted.  Assessment:   No diagnosis found. Plan:  Patient was evaluated and treated and all questions answered.  Right third digit hammertoe contracture -Explained to the patient the etiology of hammertoe contraction versus treatment options were discussed.  At this time would discuss conservative treatment options which include padding protecting it and massaging it which seems to help with the pain.  She has very mild to minimal pain.  She is a candidate for flexor tenotomy versus arthroplasty however for now we will continue holding off on managing it with conservative treatment options.  Patient agrees with the plan   Hammertoe fourth with pre-ulcerative callus status post flexor tenotomy -Sutures were removed.  No complication noted.  The incision site is completely healed.  No clinical signs of infection noted. -Rectus correction of the fourth digit noted without distal pressure to the tip of the toe.  The wound ulcer has also completely reepithelialized.   Ulcer right distal tip fourth digit ulceration -Completely reepithelialized no further ulceration noted upon debridement of the hyperkeratotic lesion. -  Mild hammertoe contractures bilaterally 2 through 5 -I explained to the patient the etiology of hammertoe contractures with associated ulceration in the setting of Raynaud's phenomena and  various treatment options were discussed.  I believe patient will benefit from wider shoe gear modification and may be custom shoes.  Patient is a prediabetic and was interested in diabetic shoes or something similar. -Patient is awaiting specialized insole/shoes. -Flexor tenotomy was performed to the right fourth digit as he continues to have nonhealing distal tip ulceration.  Raynaud's phenomena -I explained the patient the etiology of Raynaud's phenomenon various treatment options were discussed.  Patient has had a past work-up of any autoimmune disease by previous rheumatologist.  They were not able to conclusively rule in lupus or any other autoimmune disease process.  At this time this could be likely due to primary Raynaud's phenomenon. -Nitroglycerin ointment was dispensed to help vasodilate the digital arteries.    No follow-ups on file.

## 2020-07-15 ENCOUNTER — Encounter (HOSPITAL_BASED_OUTPATIENT_CLINIC_OR_DEPARTMENT_OTHER): Payer: Self-pay | Admitting: Emergency Medicine

## 2020-07-15 ENCOUNTER — Other Ambulatory Visit: Payer: Self-pay

## 2020-07-15 ENCOUNTER — Emergency Department (HOSPITAL_BASED_OUTPATIENT_CLINIC_OR_DEPARTMENT_OTHER)
Admission: EM | Admit: 2020-07-15 | Discharge: 2020-07-15 | Disposition: A | Discharge status: Home / Self Care | Payer: Medicare Other | Attending: Emergency Medicine | Admitting: Emergency Medicine

## 2020-07-15 ENCOUNTER — Emergency Department (HOSPITAL_BASED_OUTPATIENT_CLINIC_OR_DEPARTMENT_OTHER): Payer: Medicare Other

## 2020-07-15 DIAGNOSIS — Y92002 Bathroom of unspecified non-institutional (private) residence single-family (private) house as the place of occurrence of the external cause: Secondary | ICD-10-CM | POA: Insufficient documentation

## 2020-07-15 DIAGNOSIS — I5022 Chronic systolic (congestive) heart failure: Secondary | ICD-10-CM | POA: Diagnosis not present

## 2020-07-15 DIAGNOSIS — Z96652 Presence of left artificial knee joint: Secondary | ICD-10-CM | POA: Diagnosis not present

## 2020-07-15 DIAGNOSIS — S069X1A Unspecified intracranial injury with loss of consciousness of 30 minutes or less, initial encounter: Secondary | ICD-10-CM | POA: Diagnosis present

## 2020-07-15 DIAGNOSIS — Z79899 Other long term (current) drug therapy: Secondary | ICD-10-CM | POA: Insufficient documentation

## 2020-07-15 DIAGNOSIS — Z7982 Long term (current) use of aspirin: Secondary | ICD-10-CM | POA: Diagnosis not present

## 2020-07-15 DIAGNOSIS — Z853 Personal history of malignant neoplasm of breast: Secondary | ICD-10-CM | POA: Insufficient documentation

## 2020-07-15 DIAGNOSIS — I110 Hypertensive heart disease with heart failure: Secondary | ICD-10-CM | POA: Insufficient documentation

## 2020-07-15 DIAGNOSIS — Z7901 Long term (current) use of anticoagulants: Secondary | ICD-10-CM | POA: Diagnosis not present

## 2020-07-15 DIAGNOSIS — E785 Hyperlipidemia, unspecified: Secondary | ICD-10-CM | POA: Diagnosis not present

## 2020-07-15 DIAGNOSIS — S0990XA Unspecified injury of head, initial encounter: Secondary | ICD-10-CM

## 2020-07-15 DIAGNOSIS — W01198A Fall on same level from slipping, tripping and stumbling with subsequent striking against other object, initial encounter: Secondary | ICD-10-CM | POA: Insufficient documentation

## 2020-07-15 DIAGNOSIS — E1169 Type 2 diabetes mellitus with other specified complication: Secondary | ICD-10-CM | POA: Insufficient documentation

## 2020-07-15 DIAGNOSIS — R55 Syncope and collapse: Secondary | ICD-10-CM

## 2020-07-15 LAB — CBC WITH DIFFERENTIAL/PLATELET
Abs Immature Granulocytes: 0.03 10*3/uL (ref 0.00–0.07)
Basophils Absolute: 0.1 10*3/uL (ref 0.0–0.1)
Basophils Relative: 1 %
Eosinophils Absolute: 0.1 10*3/uL (ref 0.0–0.5)
Eosinophils Relative: 1 %
HCT: 48.8 % — ABNORMAL HIGH (ref 36.0–46.0)
Hemoglobin: 16.6 g/dL — ABNORMAL HIGH (ref 12.0–15.0)
Immature Granulocytes: 0 %
Lymphocytes Relative: 11 %
Lymphs Abs: 1.2 10*3/uL (ref 0.7–4.0)
MCH: 32.9 pg (ref 26.0–34.0)
MCHC: 34 g/dL (ref 30.0–36.0)
MCV: 96.8 fL (ref 80.0–100.0)
Monocytes Absolute: 0.9 10*3/uL (ref 0.1–1.0)
Monocytes Relative: 8 %
Neutro Abs: 8.7 10*3/uL — ABNORMAL HIGH (ref 1.7–7.7)
Neutrophils Relative %: 79 %
Platelets: 151 10*3/uL (ref 150–400)
RBC: 5.04 MIL/uL (ref 3.87–5.11)
RDW: 12.4 % (ref 11.5–15.5)
WBC: 11 10*3/uL — ABNORMAL HIGH (ref 4.0–10.5)
nRBC: 0 % (ref 0.0–0.2)

## 2020-07-15 LAB — BASIC METABOLIC PANEL
Anion gap: 11 (ref 5–15)
BUN: 22 mg/dL (ref 8–23)
CO2: 27 mmol/L (ref 22–32)
Calcium: 9.1 mg/dL (ref 8.9–10.3)
Chloride: 100 mmol/L (ref 98–111)
Creatinine, Ser: 0.9 mg/dL (ref 0.44–1.00)
GFR, Estimated: >60 mL/min (ref >60)
Glucose, Bld: 126 mg/dL — ABNORMAL HIGH (ref 70–99)
Potassium: 4.3 mmol/L (ref 3.5–5.1)
Sodium: 138 mmol/L (ref 135–145)

## 2020-07-15 NOTE — Discharge Instructions (Addendum)
If you develop a new or worsening headache, dizziness, vomiting, confusion or any other new/concerning symptoms then return to the ER or call 911.

## 2020-07-15 NOTE — ED Provider Notes (Signed)
[place] EMERGENCY DEPARTMENT Provider Note   CSN: [id number] Arrival date & time: 07/15/20  0865     History Chief Complaint  Patient presents with  . Loss of Consciousness  . Fall    Natalie Mendoza is a 87 y.o. female.  HPI 87 year old female presents with syncope and head injury.  The patient was up around 6:30 AM and preparing to get equipment to take her cat in.  While in the storage area her hands became quite cold so she went to the bathroom to warm them up.  She felt her hands getting warmer and then very briefly felt lightheaded and then fell backwards striking her head.  She passed out.  She remembers her head hitting the tile.  She has had a headache that has improved since her nephew gave her ibuprofen.  She is on Eliquis for A. fib.  No chest pain or shortness of breath.  No neck pain.  Past Medical History:  Diagnosis Date  . Arrhythmia    Atrial Fib  . Atrial fibrillation ([place])   . Breast cancer ([place])   . CHF (congestive heart failure) ([place])   . Chronic systolic CHF (congestive heart failure) ([place])   . Depression   . Hearing impairment   . Hx of joint problems   . Hypertension   . Osteoarthritis   . Osteoporosis   . Pelvic relaxation   . Stroke [facility])    fully resolved TIA  . Vaginal pessary present     Patient Active Problem List   Diagnosis Date Noted  . Recurrent urinary tract infection 01/02/2020  . Vaginal ulcer 01/02/2020  . Chronic venous insufficiency 10/05/2019  . Type 2 diabetes mellitus without complication, without long-term current use of insulin ([place]) 08/15/2019  . Vitamin D deficiency 07/24/2019  . Status post left knee replacement 03/22/2019  . Hypomagnesemia 03/09/2019  . Acute postoperative pain 03/06/2019  . SOB (shortness of breath) 12/15/2017  . Hyperlipidemia   . Sore throat   . Chronic atrial fibrillation ([place]) 08/01/2016  . Chronic systolic CHF (congestive heart failure) ([place]) 08/01/2016  . Abdominal  distension 11/23/2015  . Nuclear sclerotic cataract of both eyes 03/29/2014  . 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/29/2014  . Osteoporosis with fracture 08/28/2013  . Left-sided weakness 03/13/2013  . TIA (transient ischemic attack) 03/13/2013  . HTN (hypertension) 03/13/2013  . Dyslipidemia 03/13/2013  . Depression 03/13/2013  . Breast cancer ([place]) 09/01/2012  . Deafness, sensorineural 07/31/2012  . Hearing aid worn 07/31/2012  . Other seborrheic keratosis 04/05/2011  . Xerosis cutis 04/05/2011  . Anxiety 03/17/2011  . Sleep related leg cramps 12/06/2007  . Major depressive disorder, single episode 12/03/2007  . Arthropathia 11/28/2007  . Age-related osteoporosis without current pathological fracture 11/28/2007  . Asymptomatic varicose veins 11/28/2007  . Pain in limb 11/28/2007  . Swelling of limb 11/28/2007    Past Surgical History:  Procedure Laterality Date  . BREAST LUMPECTOMY    . CATARACT EXTRACTION W/ INTRAOCULAR LENS  IMPLANT, BILATERAL Bilateral   . REPLACEMENT TOTAL KNEE     left     OB History    Gravida  2   Para  2   Term      Preterm      AB      Living        SAB      IAB      Ectopic      Multiple      Live Births  Family History  Problem Relation Age of Onset  . Hypertension Mother   . Stroke Mother   . Ovarian cancer Maternal Grandmother     Social History   Tobacco Use  . Smoking status: Never Smoker  . Smokeless tobacco: Never Used  Vaping Use  . Vaping Use: Never used  Substance Use Topics  . Alcohol use: No  . Drug use: No    Home Medications Prior to Admission medications   Medication Sig Start Date End Date Taking? Authorizing Provider  amLODipine (NORVASC) 2.5 MG tablet Take 2.5 mg by mouth daily.    [provider]  amoxicillin (AMOXIL) 500 MG capsule Take 1,000 mg by mouth 2 (two) times daily. 02/14/20   [provider]  amoxicillin-clavulanate (AUGMENTIN) 875-125 MG tablet   10/23/19   [provider]  anastrozole (ARIMIDEX) 1 MG tablet Take 1 mg by mouth daily.    [provider]  apixaban (ELIQUIS) 5 MG TABS tablet Take 1 tablet (5 mg total) by mouth 2 (two) times daily. 03/15/13   Monika Salk, MD  aspirin 81 MG chewable tablet Chew 1 tablet (81 mg total) by mouth daily. 09/29/19   Little, Wenda Overland, MD  bisoprolol (ZEBETA) 10 MG tablet Take 10 mg by mouth every evening.  12/05/14 04/14/19  [provider]  bisoprolol (ZEBETA) 5 MG tablet  05/03/20   [provider]  Cholecalciferol 25 MCG (1000 UT) tablet Take by mouth.    [provider]  denosumab (PROLIA) 60 MG/ML SOSY injection 60 mg    [provider]  diltiazem (CARDIZEM SR) 60 MG 12 hr capsule Take 1 capsule (60 mg total) by mouth every 12 (twelve) hours. 03/15/13   Monika Salk, MD  DULoxetine (CYMBALTA) 30 MG capsule Take 30 mg by mouth daily.    [provider]  erythromycin ophthalmic ointment 1 application at bedtime as needed.    [provider]  estradiol (ESTRACE) 0.1 MG/GM vaginal cream Place 1 Applicatorful vaginally 2 (two) times a week.    [provider]  gabapentin (NEURONTIN) 300 MG capsule  10/23/19   [provider]  nitrofurantoin, macrocrystal-monohydrate, (MACROBID) 100 MG capsule 1 capsule at bedtime with food 03/25/20   [provider]  nitrofurantoin, macrocrystal-monohydrate, (MACROBID) 100 MG capsule  03/25/20   [provider]  nitroGLYCERIN (NITRO-BID) 2 % ointment Apply 0.5 inches topically 3 (three) times daily. 11/13/19   Felipa Furnace, DPM  spironolactone (ALDACTONE) 25 MG tablet Take 25 mg by mouth daily.    [provider]  valsartan (DIOVAN) 160 MG tablet Take 80 mg by mouth daily.     [provider]  Vitamin D, Ergocalciferol, (DRISDOL) 1.25 MG (50000 UNIT) CAPS capsule Take 50,000 Units by mouth every 7 (seven) days.    [provider]     Allergies    Aspirin  Review of Systems   Review of Systems  Respiratory: Negative for shortness of breath.   Cardiovascular: Negative for chest pain.  Gastrointestinal: Negative for vomiting.  Musculoskeletal: Negative for neck pain.  Neurological: Positive for syncope, light-headedness and headaches.  All other systems reviewed and are negative.   Physical Exam Updated Vital Signs BP 108/76 (BP Location: Right Arm)   Pulse 77   Temp (!) 97.4 F (36.3 C) (Oral)   Resp 17   Wt 65 kg   SpO2 97%   BMI 23.47 kg/m   Physical Exam Vitals and nursing note reviewed.  Constitutional:  Appearance: She is well-developed and well-nourished.  HENT:     Head: Normocephalic. Contusion present.      Right Ear: External ear normal.     Left Ear: External ear normal.     Nose: Nose normal.  Eyes:     General:        Right eye: No discharge.        Left eye: No discharge.     Extraocular Movements: Extraocular movements intact.     Pupils: Pupils are equal, round, and reactive to light.  Cardiovascular:     Rate and Rhythm: Bradycardia present. Rhythm irregular.     Heart sounds: Normal heart sounds. No murmur heard.     Comments: HR~60 Pulmonary:     Effort: Pulmonary effort is normal.     Breath sounds: Normal breath sounds.  Abdominal:     Palpations: Abdomen is soft.     Tenderness: There is no abdominal tenderness.  Musculoskeletal:     Cervical back: Normal range of motion and neck supple. No rigidity or tenderness.  Skin:    General: Skin is warm and dry.  Neurological:     Mental Status: She is alert.     Comments: CN 3-12 grossly intact. 5/5 strength in all 4 extremities. Grossly normal sensation. Normal finger to nose.   Psychiatric:        Mood and Affect: Mood is not anxious.     ED Results / Procedures / Treatments   Labs (all labs ordered are listed, but only abnormal results are displayed) Labs Reviewed  CBC WITH DIFFERENTIAL/PLATELET -  Abnormal; Notable for the following components:      Result Value   WBC 11.0 (*)    Hemoglobin 16.6 (*)    HCT 48.8 (*)    Neutro Abs 8.7 (*)    All other components within normal limits  BASIC METABOLIC PANEL - Abnormal; Notable for the following components:   Glucose, Bld 126 (*)    All other components within normal limits    EKG EKG Interpretation  Date/Time:  Tuesday July 15 2020 08:17:25 EST Ventricular Rate:  55 PR Interval:    QRS Duration: 147 QT Interval:  470 QTC Calculation: 450 R Axis:   -84 Text Interpretation: Atrial fibrillation Nonspecific IVCD with LAD LVH with secondary repolarization abnormality Inferior infarct, old Anterior infarct, old Baseline wander in lead(s) V2 similar to Mar 2019 Confirmed by Sherwood Gambler [phone]) on 07/15/2020 8:26:21 AM   Radiology CT Head Wo Contrast  Result Date: 07/15/2020 CLINICAL DATA:  Syncopal episode, fall at 0630 hours this morning, bump noted to posterior head, minor head trauma, on Eliquis EXAM: CT HEAD WITHOUT CONTRAST TECHNIQUE: Contiguous axial images were obtained from the base of the skull through the vertex without intravenous contrast. Sagittal and coronal MPR images reconstructed from axial data set. COMPARISON:  03/08/2020 FINDINGS: Brain: Generalized atrophy. Normal ventricular morphology. No midline shift or mass effect. Small vessel chronic ischemic changes of deep cerebral white matter. Old infarcts posterior RIGHT parietal lobe and inferolateral RIGHT frontal lobe/insula. Small hyperdense extra-axial mass LEFT frontal consistent with meningioma 10 x 8 x 10 mm image 17. No intracranial hemorrhage, additional mass lesion or evidence of acute infarction. No extra-axial fluid collections. Vascular: Atherosclerotic calcifications of internal carotid arteries at skull base Skull: Intact Sinuses/Orbits: Clear Other: N/A IMPRESSION: Atrophy with small vessel chronic ischemic changes of deep cerebral white matter. Old  infarcts posterior RIGHT parietal lobe and inferolateral RIGHT frontal lobe/insula. Small LEFT  frontal meningioma 10 x 8 x 10 mm. No acute intracranial abnormalities. Electronically Signed   By: Lavonia Dana M.D.   On: 07/15/2020 09:00    Procedures Procedures   Medications Ordered in ED Medications - No data to display  ED Course  I have reviewed the triage vital signs and the nursing notes.  Pertinent labs & imaging results that were available during my care of the patient were reviewed by me and considered in my medical decision making (see chart for details).    MDM Rules/Calculators/A&P                          Patient's results are overall unremarkable.  Head CT without obvious head bleed.  She is feeling well.  We discussed using Tylenol instead of NSAIDs in the future.  Discussed options as far as the syncope.  Likely this was from standing at the sink for quite some time washing her hands to get her hands warmer.  However with her history of CHF and A. fib I discussed and offered observation admission which she and family declined.  Discussed following up with PCP.  We also discussed return precautions. Final Clinical Impression(s) / ED Diagnoses Final diagnoses:  Syncope and collapse  Minor head injury, initial encounter    Rx / DC Orders ED Discharge Orders    None       Sherwood Gambler, MD 07/15/20 [phone]

## 2020-07-15 NOTE — ED Notes (Signed)
Attempted IV access to RAC, successful, blood obtained for labs, tol well, 2x2's and coban wrap applied to site

## 2020-07-15 NOTE — ED Notes (Signed)
Patient transported to CT 

## 2020-07-15 NOTE — ED Notes (Signed)
ED Provider at bedside, as well as daughter

## 2020-07-15 NOTE — ED Triage Notes (Signed)
Pt arrives pov with daughter with c/o fall after syncope episode at 0630 this am. Pt was given aleve at 0700 today. Bump noted to posterior head. Pt endorses eloquis

## 2020-08-09 ENCOUNTER — Other Ambulatory Visit: Payer: Self-pay

## 2020-08-09 ENCOUNTER — Emergency Department (HOSPITAL_BASED_OUTPATIENT_CLINIC_OR_DEPARTMENT_OTHER)
Admission: EM | Admit: 2020-08-09 | Discharge: 2020-08-09 | Disposition: A | Discharge status: Home / Self Care | Payer: Medicare Other | Attending: Emergency Medicine | Admitting: Emergency Medicine

## 2020-08-09 ENCOUNTER — Encounter (HOSPITAL_BASED_OUTPATIENT_CLINIC_OR_DEPARTMENT_OTHER): Payer: Self-pay | Admitting: Emergency Medicine

## 2020-08-09 DIAGNOSIS — Z7901 Long term (current) use of anticoagulants: Secondary | ICD-10-CM | POA: Diagnosis not present

## 2020-08-09 DIAGNOSIS — Z853 Personal history of malignant neoplasm of breast: Secondary | ICD-10-CM | POA: Insufficient documentation

## 2020-08-09 DIAGNOSIS — R040 Epistaxis: Secondary | ICD-10-CM

## 2020-08-09 DIAGNOSIS — I110 Hypertensive heart disease with heart failure: Secondary | ICD-10-CM | POA: Diagnosis not present

## 2020-08-09 DIAGNOSIS — I5022 Chronic systolic (congestive) heart failure: Secondary | ICD-10-CM | POA: Insufficient documentation

## 2020-08-09 DIAGNOSIS — Z96652 Presence of left artificial knee joint: Secondary | ICD-10-CM | POA: Insufficient documentation

## 2020-08-09 DIAGNOSIS — Z8673 Personal history of transient ischemic attack (TIA), and cerebral infarction without residual deficits: Secondary | ICD-10-CM | POA: Insufficient documentation

## 2020-08-09 NOTE — ED Notes (Signed)
Daughter at the desk inquiring about a physician seeing her mother.  Provider made aware.  Bleeding stopped currently.  Encouraged to let us know if it starts back.

## 2020-08-09 NOTE — ED Notes (Signed)
EDP at bedside  

## 2020-08-09 NOTE — ED Triage Notes (Signed)
Reports nose bleed on and off for the last five days.  Reports it started back two hours ago.  Unable to get it to stop with pressure.

## 2020-08-09 NOTE — Discharge Instructions (Addendum)
You were evaluated in the Emergency Department and after careful evaluation, we did not find any emergent condition requiring admission or further testing in the hospital.  Your exam/testing today was overall reassuring.  Close examination of your nasal passages did not show an area that we could cauterize.  We recommend you follow-up with your primary care doctor and/or ENT specialist for a closer exam to see if there is an area of bleeding higher up in the nose.  When you have a nosebleed, we recommend 20 minutes of pressure.  You can use the pressure device provided.  Squirting Afrin medicine up the nose can also be helpful to stop the bleeding.  After trying these things, you can try packing the nose with gauze.  Please return to the Emergency Department if you experience any worsening of your condition.  Thank you for allowing [place] to be a part of your care.

## 2020-08-09 NOTE — ED Provider Notes (Signed)
[facility] Emergency Department Provider Note MRN:  [id number]  Arrival date & time: 08/09/20     Chief Complaint   Epistaxis   History of Present Illness   Natalie Mendoza is a 87 y.o. year-old female with a history of CHF, A. fib presenting to the ED with chief complaint of epistaxis.  Intermittent nosebleeds for the past year, worsening over the past week.  Bleeding from the nose for 3 hours today.  Has now resolved.  No recent trauma.  Takes Eliquis.  No other complaints.  Review of Systems  A complete 10 system review of systems was obtained and all systems are negative except as noted in the HPI and PMH.   Patient's Health History    Past Medical History:  Diagnosis Date  . Arrhythmia    Atrial Fib  . Atrial fibrillation ([place])   . Breast cancer ([place])   . CHF (congestive heart failure) ([place])   . Chronic systolic CHF (congestive heart failure) ([place])   . Depression   . Hearing impairment   . Hx of joint problems   . Hypertension   . Osteoarthritis   . Osteoporosis   . Pelvic relaxation   . Stroke [facility])    fully resolved TIA  . Vaginal pessary present     Past Surgical History:  Procedure Laterality Date  . BREAST LUMPECTOMY    . CATARACT EXTRACTION W/ INTRAOCULAR LENS  IMPLANT, BILATERAL Bilateral   . REPLACEMENT TOTAL KNEE     left    Family History  Problem Relation Age of Onset  . Hypertension Mother   . Stroke Mother   . Ovarian cancer Maternal Grandmother     Social History   Socioeconomic History  . Marital status: Widowed    Spouse name: Not on file  . Number of children: Not on file  . Years of education: Not on file  . Highest education level: Not on file  Occupational History  . Not on file  Tobacco Use  . Smoking status: Never Smoker  . Smokeless tobacco: Never Used  Vaping Use  . Vaping Use: Never used  Substance and Sexual Activity  . Alcohol use: No  . Drug use: No  . Sexual activity: Not on file   Other Topics Concern  . Not on file  Social History Narrative   Independent of ADLs and with ambulation.   Social Determinants of Health   Financial Resource Strain: Not on file  Food Insecurity: Not on file  Transportation Needs: Not on file  Physical Activity: Not on file  Stress: Not on file  Social Connections: Not on file  Intimate Partner Violence: Not on file     Physical Exam   Vitals:   08/09/20 2200 08/09/20 2300  BP: 130/82 122/85  Pulse: 60 60  Resp: 17 19  Temp:    SpO2: 97% 96%    CONSTITUTIONAL: Well-appearing, NAD NEURO:  Alert and oriented x 3, no focal deficits EYES:  eyes equal and reactive ENT/NECK:  no LAD, no JVD CARDIO: Regular rate, well-perfused, normal S1 and S2 PULM:  CTAB no wheezing or rhonchi GI/GU:  normal bowel sounds, non-distended, non-tender MSK/SPINE:  No gross deformities, no edema SKIN:  no rash, atraumatic PSYCH:  Appropriate speech and behavior  *Additional and/or pertinent findings included in MDM below  Diagnostic and Interventional Summary    EKG Interpretation  Date/Time:    Ventricular Rate:    PR Interval:    QRS  Duration:   QT Interval:    QTC Calculation:   R Axis:     Text Interpretation:        Labs Reviewed - No data to display  No orders to display    Medications - No data to display   Procedures  /  Critical Care Procedures  ED Course and Medical Decision Making  I have reviewed the triage vital signs, the nursing notes, and pertinent available records from the EMR.  Listed above are laboratory and imaging tests that I personally ordered, reviewed, and interpreted and then considered in my medical decision making (see below for details).  Well-appearing, normal vital signs, no active bleeding.  I use the nasal speculum device to attempt to thoroughly evaluate the nose did not see any active bleeding or irritated tissue worthy of cauterization.  Appropriate for discharge with ENT follow-up.        Barth Kirks. [place]onia Small, [place] [email or web address]  Final Clinical Impressions(s) / ED Diagnoses     ICD-10-CM   1. Epistaxis  R04.0     ED Discharge Orders    None       Discharge Instructions Discussed with and Provided to Patient:     Discharge Instructions     You were evaluated in the Emergency Department and after careful evaluation, we did not find any emergent condition requiring admission or further testing in the hospital.  Your exam/testing today was overall reassuring.  Close examination of your nasal passages did not show an area that we could cauterize.  We recommend you follow-up with your primary care doctor and/or ENT specialist for a closer exam to see if there is an area of bleeding higher up in the nose.  When you have a nosebleed, we recommend 20 minutes of pressure.  You can use the pressure device provided.  Squirting Afrin medicine up the nose can also be helpful to stop the bleeding.  After trying these things, you can try packing the nose with gauze.  Please return to the Emergency Department if you experience any worsening of your condition.  Thank you for allowing [place] to be a part of your care.        Maudie Flakes, MD 08/09/20 2325

## 2020-09-27 ENCOUNTER — Emergency Department (HOSPITAL_BASED_OUTPATIENT_CLINIC_OR_DEPARTMENT_OTHER)
Admission: EM | Admit: 2020-09-27 | Discharge: 2020-09-27 | Disposition: A | Discharge status: Home / Self Care | Payer: Medicare Other | Attending: Emergency Medicine | Admitting: Emergency Medicine

## 2020-09-27 ENCOUNTER — Encounter (HOSPITAL_BASED_OUTPATIENT_CLINIC_OR_DEPARTMENT_OTHER): Payer: Self-pay | Admitting: *Deleted

## 2020-09-27 ENCOUNTER — Other Ambulatory Visit: Payer: Self-pay

## 2020-09-27 DIAGNOSIS — Z96652 Presence of left artificial knee joint: Secondary | ICD-10-CM | POA: Insufficient documentation

## 2020-09-27 DIAGNOSIS — Z7982 Long term (current) use of aspirin: Secondary | ICD-10-CM | POA: Diagnosis not present

## 2020-09-27 DIAGNOSIS — Z853 Personal history of malignant neoplasm of breast: Secondary | ICD-10-CM | POA: Insufficient documentation

## 2020-09-27 DIAGNOSIS — I110 Hypertensive heart disease with heart failure: Secondary | ICD-10-CM | POA: Insufficient documentation

## 2020-09-27 DIAGNOSIS — E119 Type 2 diabetes mellitus without complications: Secondary | ICD-10-CM | POA: Diagnosis not present

## 2020-09-27 DIAGNOSIS — R112 Nausea with vomiting, unspecified: Secondary | ICD-10-CM | POA: Insufficient documentation

## 2020-09-27 DIAGNOSIS — I5022 Chronic systolic (congestive) heart failure: Secondary | ICD-10-CM | POA: Insufficient documentation

## 2020-09-27 DIAGNOSIS — Z7901 Long term (current) use of anticoagulants: Secondary | ICD-10-CM | POA: Diagnosis not present

## 2020-09-27 DIAGNOSIS — K529 Noninfective gastroenteritis and colitis, unspecified: Secondary | ICD-10-CM

## 2020-09-27 DIAGNOSIS — R197 Diarrhea, unspecified: Secondary | ICD-10-CM | POA: Diagnosis not present

## 2020-09-27 DIAGNOSIS — Z79899 Other long term (current) drug therapy: Secondary | ICD-10-CM | POA: Diagnosis not present

## 2020-09-27 LAB — COMPREHENSIVE METABOLIC PANEL
ALT: 19 U/L (ref 0–44)
AST: 22 U/L (ref 15–41)
Albumin: 4.4 g/dL (ref 3.5–5.0)
Alkaline Phosphatase: 30 U/L — ABNORMAL LOW (ref 38–126)
Anion gap: 11 (ref 5–15)
BUN: 34 mg/dL — ABNORMAL HIGH (ref 8–23)
CO2: 24 mmol/L (ref 22–32)
Calcium: 9.3 mg/dL (ref 8.9–10.3)
Chloride: 101 mmol/L (ref 98–111)
Creatinine, Ser: 1.33 mg/dL — ABNORMAL HIGH (ref 0.44–1.00)
GFR, Estimated: 39 mL/min — ABNORMAL LOW (ref >60)
Glucose, Bld: 118 mg/dL — ABNORMAL HIGH (ref 70–99)
Potassium: 4.2 mmol/L (ref 3.5–5.1)
Sodium: 136 mmol/L (ref 135–145)
Total Bilirubin: 1.3 mg/dL — ABNORMAL HIGH (ref 0.3–1.2)
Total Protein: 7.3 g/dL (ref 6.5–8.1)

## 2020-09-27 LAB — CBC
HCT: 48.3 % — ABNORMAL HIGH (ref 36.0–46.0)
Hemoglobin: 16.1 g/dL — ABNORMAL HIGH (ref 12.0–15.0)
MCH: 32.1 pg (ref 26.0–34.0)
MCHC: 33.3 g/dL (ref 30.0–36.0)
MCV: 96.4 fL (ref 80.0–100.0)
Platelets: 230 10*3/uL (ref 150–400)
RBC: 5.01 MIL/uL (ref 3.87–5.11)
RDW: 13.1 % (ref 11.5–15.5)
WBC: 5.8 10*3/uL (ref 4.0–10.5)
nRBC: 0 % (ref 0.0–0.2)

## 2020-09-27 MED ORDER — SODIUM CHLORIDE 0.9 % IV BOLUS
1000.0000 mL | Freq: Once | INTRAVENOUS | Status: AC
  Administered 2020-09-27: 1000 mL via INTRAVENOUS

## 2020-09-27 MED ORDER — ONDANSETRON HCL 4 MG/2ML IJ SOLN
4.0000 mg | Freq: Once | INTRAMUSCULAR | Status: AC
  Administered 2020-09-27: 4 mg via INTRAVENOUS
  Filled 2020-09-27: qty 2

## 2020-09-27 NOTE — Discharge Instructions (Addendum)
It was our pleasure to provide your ER care today - we hope that you feel better.  Drink plenty of fluids/stay well hydrated.   Follow up with primary care doctor in 2-3 days if symptoms fail to improve/resolve.  Return to ER if worse, new symptoms, fevers, persistent vomiting, severe abdominal pain, or other concern.

## 2020-09-27 NOTE — ED Triage Notes (Signed)
Pt c/o of n/v/d that started 3 days ago. None today. Pt c/o of feeling generally weak. Denies any abd pain. Fevers unknown. Has had family members with same symptoms.

## 2020-09-27 NOTE — ED Provider Notes (Signed)
[facility] EMERGENCY DEPT Provider Note   CSN: [id number] Arrival date & time: 09/27/20  1929     History Chief Complaint  Patient presents with  . Emesis    Vomiting and diarrhea     Natalie Mendoza is a 87 y.o. female.  Patient c/o nvd in past couple days. Symptoms acute onset, moderate, persistent, episodic. Emesis color of recently ingested food/fluids. No bloody or bilious emesis. No abd pain or distension. Diarrhea stools, moderate, watery brown. No bloody bms. No fever or chills. No dysuria or hematuria. States others in home recently ill with same symptoms. No known bad food ingestion. No travel. No faintness.   The history is provided by the patient.  Emesis Associated symptoms: diarrhea   Associated symptoms: no abdominal pain, no chills, no cough, no fever and no sore throat        Past Medical History:  Diagnosis Date  . Arrhythmia    Atrial Fib  . Atrial fibrillation ([place])   . Breast cancer ([place])   . CHF (congestive heart failure) ([place])   . Chronic systolic CHF (congestive heart failure) ([place])   . Depression   . Hearing impairment   . Hx of joint problems   . Hypertension   . Osteoarthritis   . Osteoporosis   . Pelvic relaxation   . Stroke [facility])    fully resolved TIA  . Vaginal pessary present     Patient Active Problem List   Diagnosis Date Noted  . Recurrent urinary tract infection 01/02/2020  . Vaginal ulcer 01/02/2020  . Chronic venous insufficiency 10/05/2019  . Type 2 diabetes mellitus without complication, without long-term current use of insulin ([place]) 08/15/2019  . Vitamin D deficiency 07/24/2019  . Status post left knee replacement 03/22/2019  . Hypomagnesemia 03/09/2019  . Acute postoperative pain 03/06/2019  . SOB (shortness of breath) 12/15/2017  . Hyperlipidemia   . Sore throat   . Chronic atrial fibrillation ([place]) 08/01/2016  . Chronic systolic CHF (congestive heart failure) ([place]) 08/01/2016  . Abdominal  distension 11/23/2015  . Nuclear sclerotic cataract of both eyes 03/29/2014  . 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/29/2014  . Osteoporosis with fracture 08/28/2013  . Left-sided weakness 03/13/2013  . TIA (transient ischemic attack) 03/13/2013  . HTN (hypertension) 03/13/2013  . Dyslipidemia 03/13/2013  . Depression 03/13/2013  . Breast cancer ([place]) 09/01/2012  . Deafness, sensorineural 07/31/2012  . Hearing aid worn 07/31/2012  . Other seborrheic keratosis 04/05/2011  . Xerosis cutis 04/05/2011  . Anxiety 03/17/2011  . Sleep related leg cramps 12/06/2007  . Major depressive disorder, single episode 12/03/2007  . Arthropathia 11/28/2007  . Age-related osteoporosis without current pathological fracture 11/28/2007  . Asymptomatic varicose veins 11/28/2007  . Pain in limb 11/28/2007  . Swelling of limb 11/28/2007    Past Surgical History:  Procedure Laterality Date  . BREAST LUMPECTOMY    . CATARACT EXTRACTION W/ INTRAOCULAR LENS  IMPLANT, BILATERAL Bilateral   . REPLACEMENT TOTAL KNEE     left     OB History    Gravida  2   Para  2   Term      Preterm      AB      Living        SAB      IAB      Ectopic      Multiple      Live Births              Family  History  Problem Relation Age of Onset  . Hypertension Mother   . Stroke Mother   . Ovarian cancer Maternal Grandmother     Social History   Tobacco Use  . Smoking status: Never Smoker  . Smokeless tobacco: Never Used  Vaping Use  . Vaping Use: Never used  Substance Use Topics  . Alcohol use: No  . Drug use: No    Home Medications Prior to Admission medications   Medication Sig Start Date End Date Taking? Authorizing Provider  amLODipine (NORVASC) 2.5 MG tablet Take 2.5 mg by mouth daily.    [provider]  amoxicillin (AMOXIL) 500 MG capsule Take 1,000 mg by mouth 2 (two) times daily. 02/14/20   [provider]  amoxicillin-clavulanate (AUGMENTIN) 875-125 MG tablet   10/23/19   [provider]  anastrozole (ARIMIDEX) 1 MG tablet Take 1 mg by mouth daily.    [provider]  apixaban (ELIQUIS) 5 MG TABS tablet Take 1 tablet (5 mg total) by mouth 2 (two) times daily. 03/15/13   Monika Salk, MD  aspirin 81 MG chewable tablet Chew 1 tablet (81 mg total) by mouth daily. 09/29/19   Little, Wenda Overland, MD  bisoprolol (ZEBETA) 10 MG tablet Take 10 mg by mouth every evening.  12/05/14 04/14/19  [provider]  bisoprolol (ZEBETA) 5 MG tablet  05/03/20   [provider]  Cholecalciferol 25 MCG (1000 UT) tablet Take by mouth.    [provider]  denosumab (PROLIA) 60 MG/ML SOSY injection 60 mg    [provider]  diltiazem (CARDIZEM SR) 60 MG 12 hr capsule Take 1 capsule (60 mg total) by mouth every 12 (twelve) hours. 03/15/13   Monika Salk, MD  DULoxetine (CYMBALTA) 30 MG capsule Take 30 mg by mouth daily.    [provider]  erythromycin ophthalmic ointment 1 application at bedtime as needed.    [provider]  estradiol (ESTRACE) 0.1 MG/GM vaginal cream Place 1 Applicatorful vaginally 2 (two) times a week.    [provider]  gabapentin (NEURONTIN) 300 MG capsule  10/23/19   [provider]  nitrofurantoin, macrocrystal-monohydrate, (MACROBID) 100 MG capsule 1 capsule at bedtime with food 03/25/20   [provider]  nitrofurantoin, macrocrystal-monohydrate, (MACROBID) 100 MG capsule  03/25/20   [provider]  nitroGLYCERIN (NITRO-BID) 2 % ointment Apply 0.5 inches topically 3 (three) times daily. 11/13/19   Felipa Furnace, DPM  spironolactone (ALDACTONE) 25 MG tablet Take 25 mg by mouth daily.    [provider]  valsartan (DIOVAN) 160 MG tablet Take 80 mg by mouth daily.     [provider]  Vitamin D, Ergocalciferol, (DRISDOL) 1.25 MG (50000 UNIT) CAPS capsule Take 50,000 Units by mouth every 7 (seven) days.    [provider]     Allergies    Aspirin  Review of Systems   Review of Systems  Constitutional: Negative for chills and fever.  HENT: Negative for sore throat.   Eyes: Negative for redness.  Respiratory: Negative for cough and shortness of breath.   Cardiovascular: Negative for chest pain.  Gastrointestinal: Positive for diarrhea, nausea and vomiting. Negative for abdominal pain.  Genitourinary: Negative for dysuria and flank pain.  Musculoskeletal: Negative for back pain and neck pain.  Skin: Negative for rash.  Neurological: Negative for syncope.  Hematological:       No abnormal bleeding.   Psychiatric/Behavioral: Negative for confusion.    Physical Exam Updated  Vital Signs Ht 1.689 m (5' 6.5")   Wt 63.5 kg   BMI 22.26 kg/m   Physical Exam Vitals and nursing note reviewed.  Constitutional:      Appearance: Normal appearance. She is well-developed.  HENT:     Head: Atraumatic.     Nose: Nose normal.     Mouth/Throat:     Mouth: Mucous membranes are moist.  Eyes:     General: No scleral icterus.    Conjunctiva/sclera: Conjunctivae normal.  Neck:     Trachea: No tracheal deviation.  Cardiovascular:     Rate and Rhythm: Normal rate and regular rhythm.     Pulses: Normal pulses.     Heart sounds: Normal heart sounds. No murmur heard. No friction rub. No gallop.   Pulmonary:     Effort: Pulmonary effort is normal. No respiratory distress.     Breath sounds: Normal breath sounds.  Abdominal:     General: Bowel sounds are normal. There is no distension.     Palpations: Abdomen is soft.     Tenderness: There is no abdominal tenderness. There is no guarding.  Genitourinary:    Comments: No cva tenderness.  Musculoskeletal:        General: No swelling.     Cervical back: Normal range of motion and neck supple. No rigidity. No muscular tenderness.  Skin:    General: Skin is warm and dry.     Findings: No rash.  Neurological:     Mental Status: She is alert.     Comments:  Alert, speech normal.   Psychiatric:        Mood and Affect: Mood normal.     ED Results / Procedures / Treatments   Labs (all labs ordered are listed, but only abnormal results are displayed) Results for orders placed or performed during the hospital encounter of 09/27/20  CBC  Result Value Ref Range   WBC 5.8 4.0 - 10.5 K/uL   RBC 5.01 3.87 - 5.11 MIL/uL   Hemoglobin 16.1 (H) 12.0 - 15.0 g/dL   HCT 48.3 (H) 36.0 - 46.0 %   MCV 96.4 80.0 - 100.0 fL   MCH 32.1 26.0 - 34.0 pg   MCHC 33.3 30.0 - 36.0 g/dL   RDW 13.1 11.5 - 15.5 %   Platelets 230 150 - 400 K/uL   nRBC 0.0 0.0 - 0.2 %  Comprehensive metabolic panel  Result Value Ref Range   Sodium 136 135 - 145 mmol/L   Potassium 4.2 3.5 - 5.1 mmol/L   Chloride 101 98 - 111 mmol/L   CO2 24 22 - 32 mmol/L   Glucose, Bld 118 (H) 70 - 99 mg/dL   BUN 34 (H) 8 - 23 mg/dL   Creatinine, Ser 1.33 (H) 0.44 - 1.00 mg/dL   Calcium 9.3 8.9 - 10.3 mg/dL   Total Protein 7.3 6.5 - 8.1 g/dL   Albumin 4.4 3.5 - 5.0 g/dL   AST 22 15 - 41 U/L   ALT 19 0 - 44 U/L   Alkaline Phosphatase 30 (L) 38 - 126 U/L   Total Bilirubin 1.3 (H) 0.3 - 1.2 mg/dL   GFR, Estimated 39 (L) >60 mL/min   Anion gap 11 5 - 15   EKG EKG Interpretation  Date/Time:  Saturday September 27 2020 20:01:42 EDT Ventricular Rate:  71 PR Interval:    QRS Duration: 138 QT Interval:  416 QTC Calculation: 453 R Axis:   -81 Text Interpretation: Atrial fibrillation Non-specific  intra-ventricular conduction delay Non-specific ST-t changes No significant change since last tracing Confirmed by Lajean Saver [phone]) on 09/27/2020 8:10:21 PM   Radiology No results found.  Procedures Procedures   Medications Ordered in ED Medications  sodium chloride 0.9 % bolus 1,000 mL (has no administration in time range)  ondansetron (ZOFRAN) injection 4 mg (4 mg Intravenous Given 09/27/20 2030)    ED Course  I have reviewed the triage vital signs and the nursing notes.  Pertinent labs &  imaging results that were available during my care of the patient were reviewed by me and considered in my medical decision making (see chart for details).    MDM Rules/Calculators/A&P                          Iv ns. Labs.   Reviewed nursing notes and prior charts for additional history.   Iv ns bolus. zofran iv.   Labs reviewed/interpreted by me - wbc normal.  Trial of po fluids.   Recheck no nvd.   Pt appears stable for d/c.   Return precautions provided.    Final Clinical Impression(s) / ED Diagnoses Final diagnoses:  None    Rx / DC Orders ED Discharge Orders    None       Lajean Saver, MD 09/27/20 2111

## 2021-03-26 ENCOUNTER — Encounter (HOSPITAL_BASED_OUTPATIENT_CLINIC_OR_DEPARTMENT_OTHER): Payer: Self-pay

## 2021-03-26 ENCOUNTER — Encounter (INDEPENDENT_AMBULATORY_CARE_PROVIDER_SITE_OTHER): Payer: Self-pay | Admitting: Ophthalmology

## 2021-03-26 ENCOUNTER — Other Ambulatory Visit: Payer: Self-pay

## 2021-03-26 ENCOUNTER — Ambulatory Visit (INDEPENDENT_AMBULATORY_CARE_PROVIDER_SITE_OTHER): Payer: Medicare Other | Admitting: Ophthalmology

## 2021-03-26 ENCOUNTER — Emergency Department (HOSPITAL_BASED_OUTPATIENT_CLINIC_OR_DEPARTMENT_OTHER): Payer: Medicare Other

## 2021-03-26 ENCOUNTER — Emergency Department (HOSPITAL_BASED_OUTPATIENT_CLINIC_OR_DEPARTMENT_OTHER)
Admission: EM | Admit: 2021-03-26 | Discharge: 2021-03-26 | Disposition: A | Discharge status: Home / Self Care | Payer: Medicare Other | Attending: Emergency Medicine | Admitting: Emergency Medicine

## 2021-03-26 DIAGNOSIS — Z853 Personal history of malignant neoplasm of breast: Secondary | ICD-10-CM | POA: Insufficient documentation

## 2021-03-26 DIAGNOSIS — H1820 Unspecified corneal edema: Secondary | ICD-10-CM

## 2021-03-26 DIAGNOSIS — I5022 Chronic systolic (congestive) heart failure: Secondary | ICD-10-CM | POA: Diagnosis not present

## 2021-03-26 DIAGNOSIS — I110 Hypertensive heart disease with heart failure: Secondary | ICD-10-CM | POA: Insufficient documentation

## 2021-03-26 DIAGNOSIS — Z961 Presence of intraocular lens: Secondary | ICD-10-CM

## 2021-03-26 DIAGNOSIS — R4182 Altered mental status, unspecified: Secondary | ICD-10-CM | POA: Insufficient documentation

## 2021-03-26 DIAGNOSIS — Z7982 Long term (current) use of aspirin: Secondary | ICD-10-CM | POA: Insufficient documentation

## 2021-03-26 DIAGNOSIS — Z8673 Personal history of transient ischemic attack (TIA), and cerebral infarction without residual deficits: Secondary | ICD-10-CM | POA: Diagnosis not present

## 2021-03-26 DIAGNOSIS — H1830 Unspecified corneal membrane change: Secondary | ICD-10-CM

## 2021-03-26 DIAGNOSIS — E119 Type 2 diabetes mellitus without complications: Secondary | ICD-10-CM | POA: Insufficient documentation

## 2021-03-26 DIAGNOSIS — Z79899 Other long term (current) drug therapy: Secondary | ICD-10-CM | POA: Insufficient documentation

## 2021-03-26 DIAGNOSIS — H5712 Ocular pain, left eye: Secondary | ICD-10-CM | POA: Diagnosis not present

## 2021-03-26 DIAGNOSIS — I4891 Unspecified atrial fibrillation: Secondary | ICD-10-CM | POA: Diagnosis not present

## 2021-03-26 DIAGNOSIS — H04123 Dry eye syndrome of bilateral lacrimal glands: Secondary | ICD-10-CM | POA: Diagnosis not present

## 2021-03-26 DIAGNOSIS — Z7901 Long term (current) use of anticoagulants: Secondary | ICD-10-CM | POA: Insufficient documentation

## 2021-03-26 DIAGNOSIS — Z96652 Presence of left artificial knee joint: Secondary | ICD-10-CM | POA: Diagnosis not present

## 2021-03-26 DIAGNOSIS — H18523 Epithelial (juvenile) corneal dystrophy, bilateral: Secondary | ICD-10-CM | POA: Diagnosis not present

## 2021-03-26 LAB — CBC WITH DIFFERENTIAL/PLATELET
Abs Immature Granulocytes: 0.05 10*3/uL (ref 0.00–0.07)
Basophils Absolute: 0.1 10*3/uL (ref 0.0–0.1)
Basophils Relative: 1 %
Eosinophils Absolute: 0.1 10*3/uL (ref 0.0–0.5)
Eosinophils Relative: 2 %
HCT: 44.8 % (ref 36.0–46.0)
Hemoglobin: 15 g/dL (ref 12.0–15.0)
Immature Granulocytes: 1 %
Lymphocytes Relative: 27 %
Lymphs Abs: 2.3 10*3/uL (ref 0.7–4.0)
MCH: 32.7 pg (ref 26.0–34.0)
MCHC: 33.5 g/dL (ref 30.0–36.0)
MCV: 97.6 fL (ref 80.0–100.0)
Monocytes Absolute: 1.2 10*3/uL — ABNORMAL HIGH (ref 0.1–1.0)
Monocytes Relative: 15 %
Neutro Abs: 4.5 10*3/uL (ref 1.7–7.7)
Neutrophils Relative %: 54 %
Platelets: 196 10*3/uL (ref 150–400)
RBC: 4.59 MIL/uL (ref 3.87–5.11)
RDW: 13 % (ref 11.5–15.5)
WBC: 8.3 10*3/uL (ref 4.0–10.5)
nRBC: 0 % (ref 0.0–0.2)

## 2021-03-26 LAB — BASIC METABOLIC PANEL
Anion gap: 8 (ref 5–15)
BUN: 27 mg/dL — ABNORMAL HIGH (ref 8–23)
CO2: 25 mmol/L (ref 22–32)
Calcium: 8.7 mg/dL — ABNORMAL LOW (ref 8.9–10.3)
Chloride: 105 mmol/L (ref 98–111)
Creatinine, Ser: 0.89 mg/dL (ref 0.44–1.00)
GFR, Estimated: >60 mL/min (ref >60)
Glucose, Bld: 91 mg/dL (ref 70–99)
Potassium: 4.8 mmol/L (ref 3.5–5.1)
Sodium: 138 mmol/L (ref 135–145)

## 2021-03-26 MED ORDER — BACITRACIN-POLYMYXIN B 500-10000 UNIT/GM OP OINT: TOPICAL_OINTMENT | Freq: Four times a day (QID) | OPHTHALMIC | 0 refills | Status: AC

## 2021-03-26 MED ORDER — TETRACAINE HCL 0.5 % OP SOLN
1.0000 [drp] | Freq: Once | OPHTHALMIC | Status: AC
  Administered 2021-03-26: 1 [drp] via OPHTHALMIC

## 2021-03-26 MED ORDER — FENTANYL CITRATE PF 50 MCG/ML IJ SOSY
50.0000 ug | PREFILLED_SYRINGE | Freq: Once | INTRAMUSCULAR | Status: AC
  Administered 2021-03-26: 50 ug via INTRAVENOUS
  Filled 2021-03-26: qty 1

## 2021-03-26 MED ORDER — OFLOXACIN 0.3 % OP SOLN: 1.0000 [drp] | Freq: Four times a day (QID) | OPHTHALMIC | 0 refills | Status: AC

## 2021-03-26 MED ORDER — ONDANSETRON HCL 4 MG/2ML IJ SOLN
4.0000 mg | Freq: Once | INTRAMUSCULAR | Status: AC
  Administered 2021-03-26: 4 mg via INTRAVENOUS
  Filled 2021-03-26: qty 2

## 2021-03-26 MED ORDER — HYDROCODONE-ACETAMINOPHEN 5-325 MG PO TABS: 1.0000 | ORAL_TABLET | Freq: Four times a day (QID) | ORAL | 0 refills | Status: DC | PRN

## 2021-03-26 MED ORDER — HYDROCODONE-ACETAMINOPHEN 5-325 MG PO TABS: 1.0000 | ORAL_TABLET | Freq: Once | ORAL | Status: DC

## 2021-03-26 NOTE — Progress Notes (Signed)
[facility] Note  03/26/2021     CHIEF COMPLAINT Patient presents for Eye Pain   HISTORY OF PRESENT ILLNESS: Natalie Mendoza is a 87 y.o. female who presents to the clinic today for:   HPI     Eye Pain     In left eye.  Characterized as sharp pain.  Occurring constantly.  Since onset it is stable.  Associated symptoms include blurred vision and discharge.  Treatments tried include artificial tears and warm compresses.  Response to treatment was no improvement.  I, the attending physician,  performed the HPI with the patient and updated documentation appropriately.        Comments   Pt of Dr. Maudie Mercury and Dr. Satira Mccallum w/ history of dry eyes / MGD / ocular rosacea and EBMD, developed severe eye pain, redness and tearing this afternoon while driving to hair salon. Pt reports associated blurry vision OS. Pt presented to ED where CT head was performed to rule out stroke. Pt referred to Ophthalmology for further evaluation.      Last edited by Bernarda Caffey, MD on 03/26/2021 11:33 PM.      Referring physician: Blanchie Dessert, MD [place],  [place] [address]  HISTORICAL INFORMATION:   Selected notes from the MEDICAL RECORD NUMBER Referred by Dr. Truman Hayward:  Ocular Hx- PMH-    CURRENT MEDICATIONS: Current Outpatient Medications (Ophthalmic Drugs)  Medication Sig   bacitracin-polymyxin b (POLYSPORIN) ophthalmic ointment Place into the left eye 4 (four) times daily for 10 days. Place a 1/2 inch ribbon of ointment into the lower eyelid.   ofloxacin (OCUFLOX) 0.3 % ophthalmic solution Place 1 drop into the left eye 4 (four) times daily for 10 days.   erythromycin ophthalmic ointment 1 application at bedtime as needed.   No current facility-administered medications for this visit. (Ophthalmic Drugs)   Current Outpatient Medications (Other)  Medication Sig   amLODipine (NORVASC) 2.5 MG tablet Take 2.5 mg by mouth daily.   amoxicillin  (AMOXIL) 500 MG capsule Take 1,000 mg by mouth 2 (two) times daily.   amoxicillin-clavulanate (AUGMENTIN) 875-125 MG tablet    anastrozole (ARIMIDEX) 1 MG tablet Take 1 mg by mouth daily.   apixaban (ELIQUIS) 5 MG TABS tablet Take 1 tablet (5 mg total) by mouth 2 (two) times daily.   aspirin 81 MG chewable tablet Chew 1 tablet (81 mg total) by mouth daily.   bisoprolol (ZEBETA) 10 MG tablet Take 10 mg by mouth every evening.    bisoprolol (ZEBETA) 5 MG tablet    Cholecalciferol 25 MCG (1000 UT) tablet Take by mouth.   denosumab (PROLIA) 60 MG/ML SOSY injection 60 mg   diltiazem (CARDIZEM SR) 60 MG 12 hr capsule Take 1 capsule (60 mg total) by mouth every 12 (twelve) hours.   DULoxetine (CYMBALTA) 30 MG capsule Take 30 mg by mouth daily.   estradiol (ESTRACE) 0.1 MG/GM vaginal cream Place 1 Applicatorful vaginally 2 (two) times a week.   gabapentin (NEURONTIN) 300 MG capsule    HYDROcodone-acetaminophen (NORCO/VICODIN) 5-325 MG tablet Take 1 tablet by mouth every 6 (six) hours as needed for severe pain.   nitrofurantoin, macrocrystal-monohydrate, (MACROBID) 100 MG capsule 1 capsule at bedtime with food   nitrofurantoin, macrocrystal-monohydrate, (MACROBID) 100 MG capsule    nitroGLYCERIN (NITRO-BID) 2 % ointment Apply 0.5 inches topically 3 (three) times daily.   spironolactone (ALDACTONE) 25 MG tablet Take 25 mg by mouth daily.   valsartan (DIOVAN) 160 MG  tablet Take 80 mg by mouth daily.    Vitamin D, Ergocalciferol, (DRISDOL) 1.25 MG (50000 UNIT) CAPS capsule Take 50,000 Units by mouth every 7 (seven) days.   No current facility-administered medications for this visit. (Other)      REVIEW OF SYSTEMS: ROS   Positive for: Musculoskeletal, HENT, Cardiovascular, Eyes Negative for: Constitutional, Gastrointestinal, Neurological, Skin, Genitourinary, Endocrine, Respiratory, Psychiatric, Allergic/Imm, Heme/Lymph Last edited by Bernarda Caffey, MD on 03/26/2021 11:49 PM.        ALLERGIES Allergies  Allergen Reactions   Aspirin     Pt states she does not have an allergy to aspirn     PAST MEDICAL HISTORY Past Medical History:  Diagnosis Date   Arrhythmia    Atrial Fib   Atrial fibrillation (H[facility])    Breast cancer (H[facility])    CHF (congestive heart failure) ([facility])    Chronic systolic CHF (congestive heart failure) ([facility])    Depression    Hearing impairment    Hx of joint problems    Hypertension    Osteoarthritis    Osteoporosis    Pelvic relaxation    Stroke ([place])    fully resolved TIA   Vaginal pessary present    Past Surgical History:  Procedure Laterality Date   BREAST LUMPECTOMY     CATARACT EXTRACTION W/ INTRAOCULAR LENS  IMPLANT, BILATERAL Bilateral    REPLACEMENT TOTAL KNEE     left    FAMILY HISTORY Family History  Problem Relation Age of Onset   Hypertension Mother    Stroke Mother    Ovarian cancer Maternal Grandmother     SOCIAL HISTORY Social History   Tobacco Use   Smoking status: Never   Smokeless tobacco: Never  Vaping Use   Vaping Use: Never used  Substance Use Topics   Alcohol use: No   Drug use: No         OPHTHALMIC EXAM:  Base Eye Exam     Visual Acuity (Snellen - Linear)       Right Left   Dist [place] 20/20 20/60 +2   Dist ph [place]  NI         Tonometry (Tonopen, 10:48 PM)       Right Left   Pressure 14 13         Pupils       Dark Light Shape React APD   Right 4 2 Round + -   Left 4 2 Round + -         Visual Fields (Counting fingers)       Left Right    Full Full         Extraocular Movement       Right Left    Full, Ortho Full, Ortho         Neuro/Psych     Oriented x3: Yes   Mood/Affect: Normal           Slit Lamp and Fundus Exam     External Exam       Right Left   External  periorbital erythema -- greatest left upper cheek and nasal bridge         Slit Lamp Exam       Right Left   Lids/Lashes MGD +edema and erythema; +MGD   Conjunctiva/Sclera  white, quiet 1+ injection   Cornea 2-3+ PEE; dec TBUT; tear film debris 2-3+ PEE; irregular epithelium with +fluorescein staining; 2+ Descemet folds w/ edema and haze; arcus; no dendrites; dec  TBUT; tear film debris   Anterior Chamber Deep and quiet deep; no cell or flare   Iris round; dilated round; dilated   Lens PCIOL PCIOL   Vitreous clear clear         Fundus Exam       Right Left   Disc mild pallor, sharp rim mild pallor, sharp rim   C/D Ratio 0.3 0.3   Macula flat; no heme or edema flat; no heme or edema   Vessels attenuated attenuated   Periphery attached attached            IMAGING AND PROCEDURES  Imaging and Procedures for 03/26/2021  CT Head Wo Contrast       CLINICAL DATA:  Left-sided mono ocular vision loss  EXAM: CT HEAD WITHOUT CONTRAST  TECHNIQUE: Contiguous axial images were obtained from the base of the skull through the vertex without intravenous contrast.  COMPARISON:  07/15/2020  FINDINGS: Brain: Normal anatomic configuration. Parenchymal volume loss is commensurate with the patient's age. Mild periventricular white matter changes are present likely reflecting the sequela of small vessel ischemia. Remote right occipital cortical infarct and right inferior frontal cortical infarct are unchanged. Stable 10 mm left frontal meningioma along the frontal convexity abutting the precentral gyrus. No abnormal intra or extra-axial fluid collection. No abnormal mass effect or midline shift. No evidence of acute intracranial hemorrhage or infarct. Ventricular size is normal. Cerebellum unremarkable.  Vascular: No asymmetric hyperdense vasculature at the skull base.  Skull: Intact  Sinuses/Orbits: Paranasal sinuses are clear. Orbits are unremarkable.  Other: Mastoid air cells and middle ear cavities are clear.  IMPRESSION: No acute intracranial hemorrhage or infarct.  Stable remote right frontal and occipital cortical infarcts.  Unchanged 10  mm left frontal meningioma.  Stable senescent change.   Electronically Signed   By: Fidela Salisbury M.D.   On: 03/26/2021 20:36      CBC with Differential/Platelet      Component Value Flag Ref Range Units Status   WBC 8.3      4.0 - 10.5 K/uL Final   RBC 4.59      3.87 - 5.11 MIL/uL Final   Hemoglobin 15.0      12.0 - 15.0 g/dL Final   HCT 44.8      36.0 - 46.0 % Final   MCV 97.6      80.0 - 100.0 fL Final   MCH 32.7      26.0 - 34.0 pg Final   MCHC 33.5      30.0 - 36.0 g/dL Final   RDW 13.0      11.5 - 15.5 % Final   Platelets 196      150 - 400 K/uL Final   nRBC 0.0      0.0 - 0.2 % Final   Neutrophils Relative % 54       % Final   Neutro Abs 4.5      1.7 - 7.7 K/uL Final   Lymphocytes Relative 27       % Final   Lymphs Abs 2.3      0.7 - 4.0 K/uL Final   Monocytes Relative 15       % Final   Monocytes Absolute 1.2      0.1 - 1.0 K/uL Final   Eosinophils Relative 2       % Final   Eosinophils Absolute 0.1      0.0 - 0.5 K/uL Final   Basophils Relative 1       %  Final   Basophils Absolute 0.1      0.0 - 0.1 K/uL Final   Immature Granulocytes 1       % Final   Abs Immature Granulocytes 0.05      0.00 - 0.07 K/uL Final   Comment:   Performed at [facility], [place], [place] [address]           Basic metabolic panel      Component Value Flag Ref Range Units Status   Sodium 138      135 - 145 mmol/L Final   Potassium 4.8      3.5 - 5.1 mmol/L Final   Chloride 105      98 - 111 mmol/L Final   CO2 25      22 - 32 mmol/L Final   Glucose, Bld 91      70 - 99 mg/dL Final   Comment:   Glucose reference range applies only to samples taken after fasting for at least 8 hours.   BUN 27      8 - 23 mg/dL Final   Creatinine, Ser 0.89      0.44 - 1.00 mg/dL Final   Calcium 8.7      8.9 - 10.3 mg/dL Final   GFR, Estimated >60      >60 mL/min Final   Comment:   (NOTE) Calculated using the CKD-EPI Creatinine Equation (2021)    Anion  gap 8      5 - 15  Final   Comment:   Performed at [facility], [address] [place], [place] [address]                   ASSESSMENT/PLAN:    ICD-10-CM   1. Corneal epithelial defect  H18.30     2. Corneal edema of left eye  H18.20     3. Dry eyes  H04.123     4. 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 Pseudophakia of both eyes  Z96.1      1-4. Corneal epithelial defect and corneal edema related to history of dry eyes and ABMD OS - developed acute eye pain OS this afternoon - exam shows significant PEE OU and irregular central epi defect w/ irregular epithelial fluorescein staining OS - start Ofloxacin gtts and polysporin ophthalmic ointment - recommend aggressive lubrication with artificial tears and lubricating gel/ointment - pt to f/u with either Dr. Satira Mccallum or Dr. Maudie Mercury for further management  5. Pseudophakia OU  - s/p CE/IOL  - IOLs in good position, doing well  - monitor   Ophthalmic Meds Ordered this visit:  Meds ordered this encounter  Medications   ofloxacin (OCUFLOX) 0.3 % ophthalmic solution    Sig: Place 1 drop into the left eye 4 (four) times daily for 10 days.    Dispense:  10 mL    Refill:  0   bacitracin-polymyxin b (POLYSPORIN) ophthalmic ointment    Sig: Place into the left eye 4 (four) times daily for 10 days. Place a 1/2 inch ribbon of ointment into the lower eyelid.    Dispense:  3.5 g    Refill:  0     Return if symptoms worsen or fail to improve.  There are no Patient Instructions on file for this visit.   Explained the diagnoses, plan, and follow up with the patient and they expressed understanding.  Patient expressed understanding of  the importance of proper follow up care.   Gardiner Sleeper, M.D., Ph.D. Diseases & Surgery of the Retina and Vitreous [facility] 03/26/21   Abbreviations: M myopia (nearsighted); A astigmatism; H hyperopia  (farsighted); P presbyopia; Mrx spectacle prescription;  CTL contact lenses; OD right eye; OS left eye; OU both eyes  XT exotropia; ET esotropia; PEK punctate epithelial keratitis; PEE punctate epithelial erosions; DES dry eye syndrome; MGD meibomian gland dysfunction; ATs artificial tears; PFAT's preservative free artificial tears; [place] nuclear sclerotic cataract; PSC posterior subcapsular cataract; ERM epi-retinal membrane; PVD posterior vitreous detachment; RD retinal detachment; DM diabetes mellitus; DR diabetic retinopathy; NPDR non-proliferative diabetic retinopathy; PDR proliferative diabetic retinopathy; CSME clinically significant macular edema; DME diabetic macular edema; dbh dot blot hemorrhages; CWS cotton wool spot; POAG primary open angle glaucoma; C/D cup-to-disc ratio; HVF humphrey visual field; GVF goldmann visual field; OCT optical coherence tomography; IOP intraocular pressure; BRVO Branch retinal vein occlusion; CRVO central retinal vein occlusion; CRAO central retinal artery occlusion; BRAO branch retinal artery occlusion; RT retinal tear; SB scleral buckle; PPV pars plana vitrectomy; VH Vitreous hemorrhage; PRP panretinal laser photocoagulation; IVK intravitreal kenalog; VMT vitreomacular traction; MH Macular hole;  NVD neovascularization of the disc; NVE neovascularization elsewhere; AREDS age related eye disease study; ARMD age related macular degeneration; POAG primary open angle glaucoma; EBMD epithelial/anterior basement membrane dystrophy; ACIOL anterior chamber intraocular lens; IOL intraocular lens; PCIOL posterior chamber intraocular lens; Phaco/IOL phacoemulsification with intraocular lens placement; [place] photorefractive keratectomy; LASIK laser assisted in situ keratomileusis; HTN hypertension; DM diabetes mellitus; COPD chronic obstructive pulmonary disease

## 2021-03-26 NOTE — ED Provider Notes (Signed)
[facility] EMERGENCY DEPT Provider Note   CSN: [id number] Arrival date & time: 03/26/21  1918     History Chief Complaint  Patient presents with   Altered Mental Status   Eye Pain    Natalie Mendoza is a 87 y.o. female.  Patient is an 87 year old female with a history of atrial fibrillation on Eliquis, CHF, hypertension, prior stroke and bilateral cataract surgeries who presents today due to sudden onset of left eye pain.  Patient reports symptoms started around 2:00 today when she was driving to her hair appointment.  She was in the car driving when she suddenly had a severe pain in her left eye that made it difficult for her to see because she could not open her eyelid.  It was so painful it caused her to feel confused and she did not go to the right building to get her hair done.  Someone eventually had to show her how to get there but she was able to drive there and drive back home.  She has not had any numbness tingling or weakness in her upper or lower extremities.  She denies any loss of vision but just severe eye pain that seems to be worse with light.  She saw her ophthalmologist a few months ago and had a normal exam without any known eye disease.  She denies any trauma or anything getting into her eye.  She is not using any specific drops.  She has not taken anything for the pain and it has not improved.  Currently pain is 9 out of 10.  Her son is accompanying her and he reports that other than the eye pain since he has been with her she is otherwise seemed her normal self.  She has not been confused, speech abnormalities or unilateral weakness or numbness.  The history is provided by the patient and a relative.  Altered Mental Status Eye Pain      Past Medical History:  Diagnosis Date   Arrhythmia    Atrial Fib   Atrial fibrillation (HCC)    Breast cancer (HCC)    CHF (congestive heart failure) (HCC)    Chronic systolic CHF (congestive heart failure) ([place])     Depression    Hearing impairment    Hx of joint problems    Hypertension    Osteoarthritis    Osteoporosis    Pelvic relaxation    Stroke ([place])    fully resolved TIA   Vaginal pessary present     Patient Active Problem List   Diagnosis Date Noted   Recurrent urinary tract infection 01/02/2020   Vaginal ulcer 01/02/2020   Chronic venous insufficiency 10/05/2019   Type 2 diabetes mellitus without complication, without long-term current use of insulin ([place]) 08/15/2019   Vitamin D deficiency 07/24/2019   Status post left knee replacement 03/22/2019   Hypomagnesemia 03/09/2019   Acute postoperative pain 03/06/2019   SOB (shortness of breath) 12/15/2017   Hyperlipidemia    Sore throat    Chronic atrial fibrillation ([place]) 09/38/1829   Chronic systolic CHF (congestive heart failure) ([place]) 08/01/2016   Abdominal distension 11/23/2015   Nuclear sclerotic cataract of both eyes 03/29/2014   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/29/2014   Osteoporosis with fracture 08/28/2013   Left-sided weakness 03/13/2013   TIA (transient ischemic attack) 03/13/2013   HTN (hypertension) 03/13/2013   Dyslipidemia 03/13/2013   Depression 03/13/2013   Breast cancer ([place]) 09/01/2012   Deafness, sensorineural 07/31/2012   Hearing aid worn 07/31/2012  Other seborrheic keratosis 04/05/2011   Xerosis cutis 04/05/2011   Anxiety 03/17/2011   Sleep related leg cramps 12/06/2007   Major depressive disorder, single episode 12/03/2007   Arthropathia 11/28/2007   Age-related osteoporosis without current pathological fracture 11/28/2007   Asymptomatic varicose veins 11/28/2007   Pain in limb 11/28/2007   Swelling of limb 11/28/2007    Past Surgical History:  Procedure Laterality Date   BREAST LUMPECTOMY     CATARACT EXTRACTION W/ INTRAOCULAR LENS  IMPLANT, BILATERAL Bilateral    REPLACEMENT TOTAL KNEE     left     OB History     Gravida  2   Para  2   Term      Preterm      AB       Living         SAB      IAB      Ectopic      Multiple      Live Births              Family History  Problem Relation Age of Onset   Hypertension Mother    Stroke Mother    Ovarian cancer Maternal Grandmother     Social History   Tobacco Use   Smoking status: Never   Smokeless tobacco: Never  Vaping Use   Vaping Use: Never used  Substance Use Topics   Alcohol use: No   Drug use: No    Home Medications Prior to Admission medications   Medication Sig Start Date End Date Taking? Authorizing Provider  HYDROcodone-acetaminophen (NORCO/VICODIN) 5-325 MG tablet Take 1 tablet by mouth every 6 (six) hours as needed for severe pain. 03/26/21  Yes Masahiro Iglesia, Loree Fee, MD  amLODipine (NORVASC) 2.5 MG tablet Take 2.5 mg by mouth daily.    [provider]  amoxicillin (AMOXIL) 500 MG capsule Take 1,000 mg by mouth 2 (two) times daily. 02/14/20   [provider]  amoxicillin-clavulanate (AUGMENTIN) 875-125 MG tablet  10/23/19   [provider]  anastrozole (ARIMIDEX) 1 MG tablet Take 1 mg by mouth daily.    [provider]  apixaban (ELIQUIS) 5 MG TABS tablet Take 1 tablet (5 mg total) by mouth 2 (two) times daily. 03/15/13   Monika Salk, MD  aspirin 81 MG chewable tablet Chew 1 tablet (81 mg total) by mouth daily. 09/29/19   Little, Wenda Overland, MD  bisoprolol (ZEBETA) 10 MG tablet Take 10 mg by mouth every evening.  12/05/14 04/14/19  [provider]  bisoprolol (ZEBETA) 5 MG tablet  05/03/20   [provider]  Cholecalciferol 25 MCG (1000 UT) tablet Take by mouth.    [provider]  denosumab (PROLIA) 60 MG/ML SOSY injection 60 mg    [provider]  diltiazem (CARDIZEM SR) 60 MG 12 hr capsule Take 1 capsule (60 mg total) by mouth every 12 (twelve) hours. 03/15/13   Monika Salk, MD  DULoxetine (CYMBALTA) 30 MG capsule Take 30 mg by mouth daily.    [provider]  erythromycin ophthalmic ointment 1  application at bedtime as needed.    [provider]  estradiol (ESTRACE) 0.1 MG/GM vaginal cream Place 1 Applicatorful vaginally 2 (two) times a week.    [provider]  gabapentin (NEURONTIN) 300 MG capsule  10/23/19   [provider]  nitrofurantoin, macrocrystal-monohydrate, (MACROBID) 100 MG capsule 1 capsule at bedtime with food 03/25/20   [provider]  nitrofurantoin, macrocrystal-monohydrate, (MACROBID)  100 MG capsule  03/25/20   [provider]  nitroGLYCERIN (NITRO-BID) 2 % ointment Apply 0.5 inches topically 3 (three) times daily. 11/13/19   Felipa Furnace, DPM  spironolactone (ALDACTONE) 25 MG tablet Take 25 mg by mouth daily.    [provider]  valsartan (DIOVAN) 160 MG tablet Take 80 mg by mouth daily.     [provider]  Vitamin D, Ergocalciferol, (DRISDOL) 1.25 MG (50000 UNIT) CAPS capsule Take 50,000 Units by mouth every 7 (seven) days.    [provider]    Allergies    Aspirin  Review of Systems   Review of Systems  Eyes:  Positive for pain.  All other systems reviewed and are negative.  Physical Exam Updated Vital Signs BP 107/80   Pulse (!) 57   Temp 98.3 F (36.8 C) (Oral)   Resp 18   Ht 5\' 6"  (1.676 m)   Wt 66.7 kg   SpO2 94%   BMI 23.73 kg/m   Physical Exam Vitals and nursing note reviewed.  Constitutional:      General: She is in acute distress.     Appearance: She is well-developed.     Comments: Writhing in the bed and pain appears very uncomfortable.  HENT:     Head: Normocephalic and atraumatic.     Right Ear: Tympanic membrane normal.     Left Ear: Tympanic membrane normal.  Eyes:     General:        Left eye: No foreign body or discharge.     Intraocular pressure: Left eye pressure is 18 mmHg.     Extraocular Movements: Extraocular movements intact.     Conjunctiva/sclera:     Left eye: Left conjunctiva is injected. No exudate or hemorrhage.    Pupils: Pupils are  equal, round, and reactive to light.     Left eye: Pupil is round. No corneal abrasion or fluorescein uptake.     Comments: Persistent tearing of the left eye.  No fluorescein uptake in the left eye.  Visual acuity in the left eye is 20/40 and in the right eye is 20/50 and bilateral is 20/30.  Funduscopic exam is limited and difficult.  Pupils are not dilated at this time.  Unclear if there is any AV nicking or hemorrhage  Neck:     Vascular: No carotid bruit.  Cardiovascular:     Rate and Rhythm: Normal rate and regular rhythm.     Heart sounds: Normal heart sounds. No murmur heard.   No friction rub.  Pulmonary:     Effort: Pulmonary effort is normal.     Breath sounds: Normal breath sounds. No wheezing or rales.  Abdominal:     General: Bowel sounds are normal. There is no distension.     Palpations: Abdomen is soft.     Tenderness: There is no abdominal tenderness. There is no guarding or rebound.  Musculoskeletal:        General: No tenderness. Normal range of motion.     Cervical back: Normal range of motion and neck supple. No rigidity or tenderness.     Comments: No edema  Skin:    General: Skin is warm and dry.     Findings: No rash.     Comments: No rashes noted to the face  Neurological:     Mental Status: She is alert and oriented to person, place, and time. Mental status is at baseline.     Cranial Nerves: No cranial  nerve deficit.     Sensory: No sensory deficit.     Motor: No weakness.     Gait: Gait normal.  Psychiatric:        Mood and Affect: Mood normal.        Behavior: Behavior normal.    ED Results / Procedures / Treatments   Labs (all labs ordered are listed, but only abnormal results are displayed) Labs Reviewed  CBC WITH DIFFERENTIAL/PLATELET - Abnormal; Notable for the following components:      Result Value   Monocytes Absolute 1.2 (*)    All other components within normal limits  BASIC METABOLIC PANEL - Abnormal; Notable for the following  components:   BUN 27 (*)    Calcium 8.7 (*)    All other components within normal limits    EKG None  Radiology CT Head Wo Contrast  Result Date: 03/26/2021 CLINICAL DATA:  Left-sided mono ocular vision loss EXAM: CT HEAD WITHOUT CONTRAST TECHNIQUE: Contiguous axial images were obtained from the base of the skull through the vertex without intravenous contrast. COMPARISON:  07/15/2020 FINDINGS: Brain: Normal anatomic configuration. Parenchymal volume loss is commensurate with the patient's age. Mild periventricular white matter changes are present likely reflecting the sequela of small vessel ischemia. Remote right occipital cortical infarct and right inferior frontal cortical infarct are unchanged. Stable 10 mm left frontal meningioma along the frontal convexity abutting the precentral gyrus. No abnormal intra or extra-axial fluid collection. No abnormal mass effect or midline shift. No evidence of acute intracranial hemorrhage or infarct. Ventricular size is normal. Cerebellum unremarkable. Vascular: No asymmetric hyperdense vasculature at the skull base. Skull: Intact Sinuses/Orbits: Paranasal sinuses are clear. Orbits are unremarkable. Other: Mastoid air cells and middle ear cavities are clear. IMPRESSION: No acute intracranial hemorrhage or infarct. Stable remote right frontal and occipital cortical infarcts. Unchanged 10 mm left frontal meningioma. Stable senescent change. Electronically Signed   By: Fidela Salisbury M.D.   On: 03/26/2021 20:36    Procedures Procedures   Medications Ordered in ED Medications  tetracaine (PONTOCAINE) 0.5 % ophthalmic solution 1 drop (1 drop Left Eye Given 03/26/21 2042)  fentaNYL (SUBLIMAZE) injection 50 mcg (50 mcg Intravenous Given 03/26/21 2101)  ondansetron (ZOFRAN) injection 4 mg (4 mg Intravenous Given 03/26/21 2059)    ED Course  I have reviewed the triage vital signs and the nursing notes.  Pertinent labs & imaging results that were available  during my care of the patient were reviewed by me and considered in my medical decision making (see chart for details).    MDM Rules/Calculators/A&P                           Elderly female presenting with acute onset of severe left eye pain today.  Patient has a normal pressure and visual acuity with low suspicion for acute angle-closure glaucoma, retinal detachment, retinal artery or vein occlusion because her visual acuity is normal.  Patient had no fluorescein uptake on exam and denies any trauma to the area.  She did not have significant pain improvement with tetracaine but did improve some with turning the light off.  She was feeling completely fine earlier today with no eye pain.  Spoke with Dr. Coralyn Pear with ophthalmology and he is willing to see the patient tonight as it is unclear what is causing her symptoms.  She was somewhat improved with fentanyl but is still having ongoing pain.  Her daughter and son are  accompanying her and will take her directly to the ophthalmologist office.  She is not having specific signs concerning for stroke at this time as she has no upper or lower extremity involvement or facial involvement.  It is all pain in the eye.  Head CT negative for any type of hemorrhage or acute abnormality.  MDM   Amount and/or Complexity of Data Reviewed Tests in the radiology section of CPT: ordered and reviewed Independent visualization of images, tracings, or specimens: yes    Final Clinical Impression(s) / ED Diagnoses Final diagnoses:  Eye pain, left    Rx / DC Orders ED Discharge Orders          Ordered    HYDROcodone-acetaminophen (NORCO/VICODIN) 5-325 MG tablet  Every 6 hours PRN        03/26/21 2158             Blanchie Dessert, MD 03/26/21 2219

## 2021-03-26 NOTE — ED Notes (Signed)
Pt transported to CT ?

## 2021-03-26 NOTE — ED Triage Notes (Signed)
Pt reports left eye pain - states she was driving and her left went black and starts hurting  - per son, pt also  had confusion episode today. Onset 1400 today   had hx of mini stroke yrs back. PMX  -  Afib , HTN

## 2021-03-26 NOTE — ED Notes (Signed)
This RN presented the AVS utilizing Teachback Method. Patient and Family verbalizes understanding of Discharge Instructions. Opportunity for Questioning and Answers were provided. Patient Discharged from ED in wheelchair to Home with Family.

## 2021-03-26 NOTE — ED Notes (Signed)
Patient transported to CT at this Time. ?

## 2021-04-16 ENCOUNTER — Inpatient Hospital Stay (HOSPITAL_BASED_OUTPATIENT_CLINIC_OR_DEPARTMENT_OTHER)
Admission: EM | Admit: 2021-04-16 | Discharge: 2021-04-20 | DRG: 871 | Disposition: A | Discharge status: Home Health | Payer: Medicare Other | Attending: Internal Medicine | Admitting: Internal Medicine

## 2021-04-16 ENCOUNTER — Emergency Department (HOSPITAL_BASED_OUTPATIENT_CLINIC_OR_DEPARTMENT_OTHER): Payer: Medicare Other

## 2021-04-16 ENCOUNTER — Other Ambulatory Visit: Payer: Self-pay

## 2021-04-16 ENCOUNTER — Encounter (HOSPITAL_BASED_OUTPATIENT_CLINIC_OR_DEPARTMENT_OTHER): Payer: Self-pay

## 2021-04-16 DIAGNOSIS — N390 Urinary tract infection, site not specified: Secondary | ICD-10-CM | POA: Diagnosis not present

## 2021-04-16 DIAGNOSIS — N3000 Acute cystitis without hematuria: Secondary | ICD-10-CM | POA: Diagnosis not present

## 2021-04-16 DIAGNOSIS — Z8673 Personal history of transient ischemic attack (TIA), and cerebral infarction without residual deficits: Secondary | ICD-10-CM | POA: Diagnosis not present

## 2021-04-16 DIAGNOSIS — E119 Type 2 diabetes mellitus without complications: Secondary | ICD-10-CM | POA: Diagnosis not present

## 2021-04-16 DIAGNOSIS — Z79811 Long term (current) use of aromatase inhibitors: Secondary | ICD-10-CM

## 2021-04-16 DIAGNOSIS — W19XXXA Unspecified fall, initial encounter: Secondary | ICD-10-CM

## 2021-04-16 DIAGNOSIS — Z96652 Presence of left artificial knee joint: Secondary | ICD-10-CM | POA: Diagnosis present

## 2021-04-16 DIAGNOSIS — F32A Depression, unspecified: Secondary | ICD-10-CM | POA: Diagnosis not present

## 2021-04-16 DIAGNOSIS — I5022 Chronic systolic (congestive) heart failure: Secondary | ICD-10-CM | POA: Diagnosis present

## 2021-04-16 DIAGNOSIS — I110 Hypertensive heart disease with heart failure: Secondary | ICD-10-CM | POA: Diagnosis not present

## 2021-04-16 DIAGNOSIS — A419 Sepsis, unspecified organism: Principal | ICD-10-CM | POA: Diagnosis present

## 2021-04-16 DIAGNOSIS — I482 Chronic atrial fibrillation, unspecified: Secondary | ICD-10-CM | POA: Diagnosis present

## 2021-04-16 DIAGNOSIS — Z20822 Contact with and (suspected) exposure to covid-19: Secondary | ICD-10-CM | POA: Diagnosis not present

## 2021-04-16 DIAGNOSIS — E860 Dehydration: Secondary | ICD-10-CM | POA: Diagnosis not present

## 2021-04-16 DIAGNOSIS — M545 Low back pain, unspecified: Secondary | ICD-10-CM | POA: Diagnosis not present

## 2021-04-16 DIAGNOSIS — M1990 Unspecified osteoarthritis, unspecified site: Secondary | ICD-10-CM | POA: Diagnosis present

## 2021-04-16 DIAGNOSIS — Z8041 Family history of malignant neoplasm of ovary: Secondary | ICD-10-CM

## 2021-04-16 DIAGNOSIS — G9341 Metabolic encephalopathy: Secondary | ICD-10-CM | POA: Diagnosis not present

## 2021-04-16 DIAGNOSIS — F419 Anxiety disorder, unspecified: Secondary | ICD-10-CM | POA: Diagnosis not present

## 2021-04-16 DIAGNOSIS — Z853 Personal history of malignant neoplasm of breast: Secondary | ICD-10-CM | POA: Diagnosis not present

## 2021-04-16 DIAGNOSIS — Z8249 Family history of ischemic heart disease and other diseases of the circulatory system: Secondary | ICD-10-CM | POA: Diagnosis not present

## 2021-04-16 DIAGNOSIS — Z79899 Other long term (current) drug therapy: Secondary | ICD-10-CM

## 2021-04-16 DIAGNOSIS — E785 Hyperlipidemia, unspecified: Secondary | ICD-10-CM | POA: Diagnosis present

## 2021-04-16 DIAGNOSIS — M810 Age-related osteoporosis without current pathological fracture: Secondary | ICD-10-CM | POA: Diagnosis present

## 2021-04-16 DIAGNOSIS — R55 Syncope and collapse: Secondary | ICD-10-CM | POA: Diagnosis present

## 2021-04-16 DIAGNOSIS — I5032 Chronic diastolic (congestive) heart failure: Secondary | ICD-10-CM | POA: Diagnosis not present

## 2021-04-16 DIAGNOSIS — D320 Benign neoplasm of cerebral meninges: Secondary | ICD-10-CM | POA: Diagnosis present

## 2021-04-16 DIAGNOSIS — Z823 Family history of stroke: Secondary | ICD-10-CM | POA: Diagnosis not present

## 2021-04-16 DIAGNOSIS — Z7901 Long term (current) use of anticoagulants: Secondary | ICD-10-CM

## 2021-04-16 DIAGNOSIS — H9190 Unspecified hearing loss, unspecified ear: Secondary | ICD-10-CM | POA: Diagnosis present

## 2021-04-16 DIAGNOSIS — Z7982 Long term (current) use of aspirin: Secondary | ICD-10-CM

## 2021-04-16 DIAGNOSIS — I10 Essential (primary) hypertension: Secondary | ICD-10-CM | POA: Diagnosis present

## 2021-04-16 LAB — CBC
HCT: 45.8 % (ref 36.0–46.0)
Hemoglobin: 15.5 g/dL — ABNORMAL HIGH (ref 12.0–15.0)
MCH: 32.7 pg (ref 26.0–34.0)
MCHC: 33.8 g/dL (ref 30.0–36.0)
MCV: 96.6 fL (ref 80.0–100.0)
Platelets: 226 10*3/uL (ref 150–400)
RBC: 4.74 MIL/uL (ref 3.87–5.11)
RDW: 12.5 % (ref 11.5–15.5)
WBC: 13.9 10*3/uL — ABNORMAL HIGH (ref 4.0–10.5)
nRBC: 0 % (ref 0.0–0.2)

## 2021-04-16 LAB — COMPREHENSIVE METABOLIC PANEL
ALT: 19 U/L (ref 0–44)
AST: 23 U/L (ref 15–41)
Albumin: 3.9 g/dL (ref 3.5–5.0)
Alkaline Phosphatase: 36 U/L — ABNORMAL LOW (ref 38–126)
Anion gap: 9 (ref 5–15)
BUN: 23 mg/dL (ref 8–23)
CO2: 28 mmol/L (ref 22–32)
Calcium: 9.4 mg/dL (ref 8.9–10.3)
Chloride: 99 mmol/L (ref 98–111)
Creatinine, Ser: 0.83 mg/dL (ref 0.44–1.00)
GFR, Estimated: >60 mL/min (ref >60)
Glucose, Bld: 128 mg/dL — ABNORMAL HIGH (ref 70–99)
Potassium: 4.8 mmol/L (ref 3.5–5.1)
Sodium: 136 mmol/L (ref 135–145)
Total Bilirubin: 1.5 mg/dL — ABNORMAL HIGH (ref 0.3–1.2)
Total Protein: 6.8 g/dL (ref 6.5–8.1)

## 2021-04-16 LAB — URINALYSIS, ROUTINE W REFLEX MICROSCOPIC
Bilirubin Urine: NEGATIVE
Glucose, UA: NEGATIVE mg/dL
Hgb urine dipstick: NEGATIVE
Ketones, ur: NEGATIVE mg/dL
Nitrite: NEGATIVE
Specific Gravity, Urine: 1.018 (ref 1.005–1.030)
WBC, UA: >50 WBC/hpf — ABNORMAL HIGH (ref 0–5)
pH: 8 (ref 5.0–8.0)

## 2021-04-16 LAB — RESP PANEL BY RT-PCR (FLU A&B, COVID) ARPGX2
Influenza A by PCR: NEGATIVE
Influenza B by PCR: NEGATIVE
SARS Coronavirus 2 by RT PCR: NEGATIVE

## 2021-04-16 LAB — PROTIME-INR
INR: 1.1 (ref 0.8–1.2)
Prothrombin Time: 13.9 seconds (ref 11.4–15.2)

## 2021-04-16 LAB — ETHANOL: Alcohol, Ethyl (B): <10 mg/dL (ref <10)

## 2021-04-16 LAB — APTT: aPTT: 24 seconds (ref 24–36)

## 2021-04-16 LAB — LACTIC ACID, PLASMA: Lactic Acid, Venous: 2 mmol/L (ref 0.5–1.9)

## 2021-04-16 MED ORDER — LACTATED RINGERS IV BOLUS (SEPSIS)
1000.0000 mL | Freq: Once | INTRAVENOUS | Status: AC
  Administered 2021-04-16: 1000 mL via INTRAVENOUS

## 2021-04-16 MED ORDER — ONDANSETRON HCL 4 MG/2ML IJ SOLN: 4.0000 mg | Freq: Four times a day (QID) | INTRAMUSCULAR | Status: DC | PRN

## 2021-04-16 MED ORDER — LACTATED RINGERS IV BOLUS (SEPSIS)
250.0000 mL | Freq: Once | INTRAVENOUS | Status: AC
  Administered 2021-04-16: 250 mL via INTRAVENOUS

## 2021-04-16 MED ORDER — POLYETHYLENE GLYCOL 3350 17 G PO PACK: 17.0000 g | PACK | Freq: Every day | ORAL | Status: DC | PRN

## 2021-04-16 MED ORDER — LACTATED RINGERS IV SOLN: INTRAVENOUS | Status: DC

## 2021-04-16 MED ORDER — ANASTROZOLE 1 MG PO TABS: 1.0000 mg | ORAL_TABLET | Freq: Every day | ORAL | Status: DC

## 2021-04-16 MED ORDER — MELATONIN 3 MG PO TABS
3.0000 mg | ORAL_TABLET | Freq: Every evening | ORAL | Status: DC | PRN
  Administered 2021-04-16 – 2021-04-19 (×4): 3 mg via ORAL
  Filled 2021-04-16 (×4): qty 1

## 2021-04-16 MED ORDER — ACETAMINOPHEN 325 MG PO TABS
650.0000 mg | ORAL_TABLET | Freq: Four times a day (QID) | ORAL | Status: DC | PRN
  Administered 2021-04-18: 650 mg via ORAL
  Filled 2021-04-16 (×2): qty 2

## 2021-04-16 MED ORDER — SODIUM CHLORIDE 0.9 % IV SOLN
2.0000 g | INTRAVENOUS | Status: DC
Start: 2021-04-16 — End: 2021-04-19
  Administered 2021-04-16 – 2021-04-18 (×3): 2 g via INTRAVENOUS
  Filled 2021-04-16 (×4): qty 20

## 2021-04-16 MED ORDER — IOHEXOL 350 MG/ML SOLN
75.0000 mL | Freq: Once | INTRAVENOUS | Status: AC | PRN
  Administered 2021-04-16: 75 mL via INTRAVENOUS

## 2021-04-16 MED ORDER — DULOXETINE HCL 30 MG PO CPEP
30.0000 mg | ORAL_CAPSULE | Freq: Every day | ORAL | Status: DC
  Administered 2021-04-16 – 2021-04-19 (×4): 30 mg via ORAL
  Filled 2021-04-16 (×4): qty 1

## 2021-04-16 MED ORDER — APIXABAN 5 MG PO TABS
5.0000 mg | ORAL_TABLET | Freq: Two times a day (BID) | ORAL | Status: DC
  Administered 2021-04-16 – 2021-04-20 (×8): 5 mg via ORAL
  Filled 2021-04-16 (×8): qty 1

## 2021-04-16 NOTE — Progress Notes (Signed)
[facility] following for code sepsis 

## 2021-04-16 NOTE — ED Notes (Signed)
Pt is alert to self and dob but is unsure of the current month and location. Pt was slow to give DOB and states she knows the president but can not give name. Pt states tha she understands what I am asking but states, "it takes a while to gather her thoughts." Pt currently Denis back pain.

## 2021-04-16 NOTE — ED Notes (Signed)
Pt sats on arrival were 88% RA, placed on 2L [place] sats increased to 93-94%

## 2021-04-16 NOTE — ED Notes (Signed)
[facility] present to pick up pt

## 2021-04-16 NOTE — ED Notes (Signed)
Pt Bp soft, will obtain [place] pulse

## 2021-04-16 NOTE — ED Notes (Signed)
Report given to Pierce, RN at [facility]

## 2021-04-16 NOTE — ED Provider Notes (Signed)
General malaise and fatigue this morning. [facility] Provider Note   CSN: [id number] Arrival date & time: 04/16/21  1045     History Chief Complaint  Patient presents with   Natalie Mendoza is a 87 y.o. female.   Fall   Patient presents ED for evaluation after a syncopal episode and fall earlier this morning.  Patient states she started to have generalized and fatigue this morning.  She went to go to the bathroom and ended up having a syncopal episode.  Patient ended up falling knocking items off her dressing room table.  There was someone in her house with her and they said she was acting somewhat confused initially.  Daughter states she does not seem to be her usual self right now but she is not having any overt confusion and answering questions appropriately.  Unknown if the patient hit her head.  She is not having trouble with chest pain or shortness of breath.  No back pain.  No abdominal pain.  She has not had any fevers or chills.  She was diagnosed with a urinary tract infection and finished a 5-day course of antibiotics but saw her doctor couple days ago because she was still having some back discomfort.  They restarted her on antibiotics.  Past Medical History:  Diagnosis Date   Arrhythmia    Atrial Fib   Atrial fibrillation ([place])    Breast cancer (HCC)    CHF (congestive heart failure) (HCC)    Chronic systolic CHF (congestive heart failure) ([place])    Depression    Hearing impairment    Hx of joint problems    Hypertension    Osteoarthritis    Osteoporosis    Pelvic relaxation    Stroke ([place])    fully resolved TIA   Vaginal pessary present     Patient Active Problem List   Diagnosis Date Noted   Recurrent urinary tract infection 01/02/2020   Vaginal ulcer 01/02/2020   Chronic venous insufficiency 10/05/2019   Type 2 diabetes mellitus without complication, without long-term current use of insulin ([place]) 08/15/2019    Vitamin D deficiency 07/24/2019   Status post left knee replacement 03/22/2019   Hypomagnesemia 03/09/2019   Acute postoperative pain 03/06/2019   SOB (shortness of breath) 12/15/2017   Hyperlipidemia    Sore throat    Chronic atrial fibrillation ([place]) 51/70/0174   Chronic systolic CHF (congestive heart failure) ([place]) 08/01/2016   Abdominal distension 11/23/2015   Nuclear sclerotic cataract of both eyes 03/29/2014   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/29/2014   Osteoporosis with fracture 08/28/2013   Left-sided weakness 03/13/2013   TIA (transient ischemic attack) 03/13/2013   HTN (hypertension) 03/13/2013   Dyslipidemia 03/13/2013   Depression 03/13/2013   Breast cancer ([place]) 09/01/2012   Deafness, sensorineural 07/31/2012   Hearing aid worn 07/31/2012   Other seborrheic keratosis 04/05/2011   Xerosis cutis 04/05/2011   Anxiety 03/17/2011   Sleep related leg cramps 12/06/2007   Major depressive disorder, single episode 12/03/2007   Arthropathia 11/28/2007   Age-related osteoporosis without current pathological fracture 11/28/2007   Asymptomatic varicose veins 11/28/2007   Pain in limb 11/28/2007   Swelling of limb 11/28/2007    Past Surgical History:  Procedure Laterality Date   BREAST LUMPECTOMY     CATARACT EXTRACTION W/ INTRAOCULAR LENS  IMPLANT, BILATERAL Bilateral    REPLACEMENT TOTAL KNEE     left     OB History  Gravida  2   Para  2   Term      Preterm      AB      Living         SAB      IAB      Ectopic      Multiple      Live Births              Family History  Problem Relation Age of Onset   Hypertension Mother    Stroke Mother    Ovarian cancer Maternal Grandmother     Social History   Tobacco Use   Smoking status: Never   Smokeless tobacco: Never  Vaping Use   Vaping Use: Never used  Substance Use Topics   Alcohol use: No   Drug use: No    Home Medications Prior to Admission medications   Medication Sig Start  Date End Date Taking? Authorizing Provider  amLODipine (NORVASC) 2.5 MG tablet Take 2.5 mg by mouth daily.    [provider]  amoxicillin (AMOXIL) 500 MG capsule Take 1,000 mg by mouth 2 (two) times daily. 02/14/20   [provider]  amoxicillin-clavulanate (AUGMENTIN) 875-125 MG tablet  10/23/19   [provider]  anastrozole (ARIMIDEX) 1 MG tablet Take 1 mg by mouth daily.    [provider]  apixaban (ELIQUIS) 5 MG TABS tablet Take 1 tablet (5 mg total) by mouth 2 (two) times daily. 03/15/13   Monika Salk, MD  aspirin 81 MG chewable tablet Chew 1 tablet (81 mg total) by mouth daily. 09/29/19   Little, Wenda Overland, MD  bisoprolol (ZEBETA) 10 MG tablet Take 10 mg by mouth every evening.  12/05/14 04/14/19  [provider]  bisoprolol (ZEBETA) 5 MG tablet  05/03/20   [provider]  Cholecalciferol 25 MCG (1000 UT) tablet Take by mouth.    [provider]  denosumab (PROLIA) 60 MG/ML SOSY injection 60 mg    [provider]  diltiazem (CARDIZEM SR) 60 MG 12 hr capsule Take 1 capsule (60 mg total) by mouth every 12 (twelve) hours. 03/15/13   Monika Salk, MD  DULoxetine (CYMBALTA) 30 MG capsule Take 30 mg by mouth daily.    [provider]  erythromycin ophthalmic ointment 1 application at bedtime as needed.    [provider]  estradiol (ESTRACE) 0.1 MG/GM vaginal cream Place 1 Applicatorful vaginally 2 (two) times a week.    [provider]  gabapentin (NEURONTIN) 300 MG capsule  10/23/19   [provider]  HYDROcodone-acetaminophen (NORCO/VICODIN) 5-325 MG tablet Take 1 tablet by mouth every 6 (six) hours as needed for severe pain. 03/26/21   Blanchie Dessert, MD  nitrofurantoin, macrocrystal-monohydrate, (MACROBID) 100 MG capsule 1 capsule at bedtime with food 03/25/20   [provider]  nitrofurantoin, macrocrystal-monohydrate, (MACROBID) 100 MG capsule  03/25/20   [provider]  nitroGLYCERIN (NITRO-BID) 2 % ointment Apply 0.5 inches topically 3 (three) times daily. 11/13/19   Felipa Furnace, DPM  spironolactone (ALDACTONE) 25 MG tablet Take 25 mg by mouth daily.    [provider]  valsartan (DIOVAN) 160 MG tablet Take 80 mg by mouth daily.     [provider]  Vitamin D, Ergocalciferol, (DRISDOL) 1.25 MG (50000 UNIT) CAPS capsule Take 50,000 Units by mouth every 7 (seven) days.    [provider]    Allergies    Aspirin  Review of Systems  Review of Systems  All other systems reviewed and are negative.  Physical Exam Updated Vital Signs BP 107/60   Pulse (!) 52   Temp 97.8 F (36.6 C) (Oral)   Resp 15   Ht 1.676 m (5\' 6" )   Wt 66.7 kg   SpO2 99%   BMI 23.72 kg/m   Physical Exam Vitals and nursing note reviewed.  Constitutional:      General: She is not in acute distress.    Appearance: She is well-developed.  HENT:     Head: Normocephalic and atraumatic.     Right Ear: External ear normal.     Left Ear: External ear normal.  Eyes:     General: No scleral icterus.       Right eye: No discharge.        Left eye: No discharge.     Conjunctiva/sclera: Conjunctivae normal.  Neck:     Trachea: No tracheal deviation.  Cardiovascular:     Rate and Rhythm: Normal rate and regular rhythm.  Pulmonary:     Effort: Pulmonary effort is normal. No respiratory distress.     Breath sounds: Normal breath sounds. No stridor. No wheezing or rales.  Abdominal:     General: Bowel sounds are normal. There is no distension.     Palpations: Abdomen is soft.     Tenderness: There is no abdominal tenderness. There is no guarding or rebound.  Musculoskeletal:        General: No tenderness.     Cervical back: Neck supple.  Skin:    General: Skin is warm and dry.     Findings: No rash.  Neurological:     Mental Status: She is alert and oriented to person, place, and time.     Cranial Nerves: No cranial nerve deficit (No facial  droop, extraocular movements intact, tongue midline ).     Sensory: No sensory deficit.     Motor: No abnormal muscle tone or seizure activity.     Coordination: Coordination normal.     Comments: No pronator drift bilateral upper extrem, able to hold both legs off bed for 5 seconds, sensation intact in all extremities, no visual field cuts, no left or right sided neglect, normal finger-nose exam bilaterally, no nystagmus noted     ED Results / Procedures / Treatments   Labs (all labs ordered are listed, but only abnormal results are displayed) Labs Reviewed  COMPREHENSIVE METABOLIC PANEL - Abnormal; Notable for the following components:      Result Value   Glucose, Bld 128 (*)    Alkaline Phosphatase 36 (*)    Total Bilirubin 1.5 (*)    All other components within normal limits  CBC - Abnormal; Notable for the following components:   WBC 13.9 (*)    Hemoglobin 15.5 (*)    All other components within normal limits  URINALYSIS, ROUTINE W REFLEX MICROSCOPIC - Abnormal; Notable for the following components:   APPearance HAZY (*)    Protein, ur TRACE (*)    Leukocytes,Ua LARGE (*)    WBC, UA >50 (*)    Bacteria, UA RARE (*)    All other components within normal limits  LACTIC ACID, PLASMA - Abnormal; Notable for the following components:   Lactic Acid, Venous 2.0 (*)    All other components within normal limits  RESP PANEL BY RT-PCR (FLU A&B, COVID) ARPGX2  CULTURE, BLOOD (SINGLE)  CULTURE, BLOOD (SINGLE)  ETHANOL  PROTIME-INR  APTT  EKG EKG Interpretation  Date/Time:  Thursday April 16 2021 11:04:00 EDT Ventricular Rate:  54 PR Interval:    QRS Duration: 134 QT Interval:  470 QTC Calculation: 446 R Axis:   266 Text Interpretation: Atrial fibrillation Nonspecific IVCD with LAD Inferior infarct, old Anterior infarct, old No significant change since last tracing Confirmed by Dorie Rank [phone]) on 04/16/2021 11:20:31 AM  Radiology CT HEAD WO CONTRAST  Result Date:  04/16/2021 CLINICAL DATA:  Head trauma. EXAM: CT HEAD WITHOUT CONTRAST TECHNIQUE: Contiguous axial images were obtained from the base of the skull through the vertex without intravenous contrast. COMPARISON:  03/26/2021 FINDINGS: Brain: There is no evidence for acute hemorrhage, hydrocephalus, or abnormal extra-axial fluid collection. No definite CT evidence for acute infarction. Diffuse loss of parenchymal volume is consistent with atrophy. Patchy low attenuation in the deep hemispheric and periventricular white matter is nonspecific, but likely reflects chronic microvascular ischemic demyelination. 10 mm calcified left frontal meningioma is stable. Chronic posterior right frontal lobe infarct again noted. Old right occipital lobe infarct again noted. Vascular: No hyperdense vessel or unexpected calcification. Skull: No evidence for fracture. No worrisome lytic or sclerotic lesion. Sinuses/Orbits: The visualized paranasal sinuses and mastoid air cells are clear. Visualized portions of the globes and intraorbital fat are unremarkable. Other: None. IMPRESSION: 1. Stable.  No acute intracranial abnormality. 2. Atrophy with chronic small vessel white matter ischemic disease. 3. Stable 10 mm calcified left frontal meningioma. 4. Old right occipital lobe infarct. Electronically Signed   By: Misty Stanley M.D.   On: 04/16/2021 11:37   CT Angio Chest PE W and/or Wo Contrast  Result Date: 04/16/2021 CLINICAL DATA:  Unresponsive EXAM: CT ANGIOGRAPHY CHEST WITH CONTRAST TECHNIQUE: Multidetector CT imaging of the chest was performed using the standard protocol during bolus administration of intravenous contrast. Multiplanar CT image reconstructions and MIPs were obtained to evaluate the vascular anatomy. CONTRAST:  51mL OMNIPAQUE IOHEXOL 350 MG/ML SOLN COMPARISON:  Chest x-ray earlier the same day FINDINGS: Cardiovascular: No pulmonary embolism identified. Main pulmonary artery is normal caliber. Heart is enlarged with  no pericardial effusion identified. Thoracic aorta is tortuous and normal caliber. Mediastinum/Nodes: A few mildly enlarged mediastinal lymph nodes measuring up to 10 mm in short axis precarinal. No bulky hilar or axillary lymphadenopathy identified. Lungs/Pleura: No focal consolidation identified in the lungs. Mild subsegmental atelectatic changes bilaterally mostly in the lower lobes. Mild biapical pleural thickening. No pleural effusion or pneumothorax. Upper Abdomen: No acute process identified in the visualized upper abdomen. Colonic diverticulosis visualized. Musculoskeletal: Degenerative changes of the thoracic spine. No suspicious bony lesions identified. Review of the MIP images confirms the above findings. IMPRESSION: 1. No pulmonary embolism identified. 2. Mild subsegmental atelectatic changes in the lungs. 3. Cardiomegaly. 4. Colonic diverticulosis. Electronically Signed   By: Ofilia Neas M.D.   On: 04/16/2021 13:49   DG Chest Port 1 View  Result Date: 04/16/2021 CLINICAL DATA:  Fall EXAM: PORTABLE CHEST 1 VIEW COMPARISON:  09/18/2017 FINDINGS: Transverse diameter of heart is increased. Mediastinum is unremarkable. There are no signs of pulmonary edema or new focal infiltrates. There is no significant pleural effusion or pneumothorax. Surgical clips are seen in right chest wall. IMPRESSION: Cardiomegaly. There are no signs of pulmonary edema or new focal infiltrates. Reading location: [place], [place]. Electronically Signed   By: Elmer Picker M.D.   On: 04/16/2021 11:36    Procedures .Critical Care Performed by: Dorie Rank, MD Authorized by: Dorie Rank, MD   Critical care provider  statement:    Critical care time (minutes):  45   Critical care was necessary to treat or prevent imminent or life-threatening deterioration of the following conditions:  Sepsis   Critical care was time spent personally by me on the following activities:  Discussions with consultants, evaluation  of patient's response to treatment, examination of patient, ordering and review of laboratory studies, ordering and review of radiographic studies, pulse oximetry, re-evaluation of patient's condition and review of old charts   Care discussed with: admitting provider     Medications Ordered in ED Medications  lactated ringers infusion (has no administration in time range)  cefTRIAXone (ROCEPHIN) 2 g in sodium chloride 0.9 % 100 mL IVPB (0 g Intravenous Stopped 04/16/21 1458)  lactated ringers bolus 1,000 mL (1,000 mLs Intravenous New Bag/Given 04/16/21 1418)    And  lactated ringers bolus 1,000 mL (1,000 mLs Intravenous New Bag/Given 04/16/21 1514)    And  lactated ringers bolus 250 mL (has no administration in time range)  iohexol (OMNIPAQUE) 350 MG/ML injection 75 mL (75 mLs Intravenous Contrast Given 04/16/21 1330)    ED Course  I have reviewed the triage vital signs and the nursing notes.  Pertinent labs & imaging results that were available during my care of the patient were reviewed by me and considered in my medical decision making (see chart for details).  Clinical Course as of 04/16/21 1531  Thu Apr 16, 2021  1231 Head CT without acute abnormality [JK]  1231 Chest x-ray without acute findings [JK]  1257 No acute findings on chest x-ray.  Patient does have new oxygen requirement.  We will proceed with CT scan [JK]  1347 Low blood pressure noted.  Initial lactic acid level elevated.  Will start cultures, fluid bolus, abx for evolving sepsis [JK]  1349 UA consistent with uti [JK]  1501 CT scan without acute findings. [WG]  9562 Blood pressure is improving.  107/60 at the bedside [JK]    Clinical Course User Index [JK] Dorie Rank, MD   MDM Rules/Calculators/A&P                           Patient presented to the ED for evaluation of a fall and syncopal episode.  Patient recently had been treated for UTI.  She is not aware of any fevers at home but she did have generalized  malaise preceding her syncopal episode.  CT scan performed the ED as the patient is on anticoagulants.  No signs of any serious head injury.  She was noted to have an oxygen requirement so chest x-ray and CT angiogram of the chest were performed and there is no evidence of pneumonia pneumothorax pulmonary embolism or aortic aneurysm.  Patient's laboratory test did show a leukocytosis and lactic acidosis.  Patient also had an episode of hypotension with blood pressure in the 80s.  Consistent with sepsis.  Patient was started on IV fluid boluses and antibiotics.  Her blood pressure has improved with hydration.  Her mentation has also improved.  We will continue with fluid hydration and antibiotics.  I will consult with the medical service for admission and further treatment Final Clinical Impression(s) / ED Diagnoses Final diagnoses:  Fall  Sepsis, due to unspecified organism, unspecified whether acute organ dysfunction present [facility])  Syncope, unspecified syncope type  Acute cystitis without hematuria     Dorie Rank, MD 04/16/21 1531

## 2021-04-16 NOTE — ED Notes (Signed)
Report given to [facility] 

## 2021-04-16 NOTE — H&P (Addendum)
History and Physical  Natalie Mendoza ZOX:[id number] DOB: 1932/08/20 DOA: 04/16/2021  Referring physician: Accepted by Dr. Cruzita Lederer PCP: Cari Caraway, MD  Outpatient Specialists: Cardiology Patient coming from: Home through [facility] ED.  Chief Complaint: Confusion, dizziness, passed out, fall  HPI: Natalie Mendoza is a 87 y.o. female with medical history significant for chronic atrial fibrillation on Eliquis, essential hypertension, chronic diastolic CHF, hyperlipidemia, who presented due to confusion, dizziness, passing out and fall.  Associated with generalized weakness and fatigue this morning.  She went to the bathroom, felt dizzy, walked back to her bedroom, and passed out.  Unclear if she lost consciousness or how long she was unconscious.  She states she just found herself on the floor and felt dizzy prior to passing out.  At the time of this exam she appears to have mild confusion.  Patient was recently treated outpatient for urinary tract infection and finished a 5-day course of antibiotics.  She saw her primary care provider 2 days ago due to back discomfort, she was restarted on her p.o. antibiotics.  She presented to [facility] ED for further evaluation.  Work-up in the ED revealed sepsis secondary to UTI.  Cultures were obtained and she was started on Rocephin empirically.  TRH, hospitalist team, was asked to admit.  Patient admitted as direct admit to [facility] long hospital progressive unit.  ED Course: Temperature 97.9.  BP 101/64, pulse 58, respiratory 17, saturation 92% on room air.  Lab studies remarkable for elevated T bilirubin 1.5, elevated lactic acid 2.0, WBC 13.9, hemoglobin 15.5, UA positive for pyuria.  COVID-19 screening test negative.  Review of Systems: Review of systems as noted in the HPI. All other systems reviewed and are negative.   Past Medical History:  Diagnosis Date   Arrhythmia    Atrial Fib   Atrial fibrillation (HCC)    Breast cancer (HCC)    CHF  (congestive heart failure) (HCC)    Chronic systolic CHF (congestive heart failure) (HCC)    Depression    Hearing impairment    Hx of joint problems    Hypertension    Osteoarthritis    Osteoporosis    Pelvic relaxation    Stroke ([place])    fully resolved TIA   Vaginal pessary present    Past Surgical History:  Procedure Laterality Date   BREAST LUMPECTOMY     CATARACT EXTRACTION W/ INTRAOCULAR LENS  IMPLANT, BILATERAL Bilateral    REPLACEMENT TOTAL KNEE     left    Social History:  reports that she has never smoked. She has never used smokeless tobacco. She reports that she does not drink alcohol and does not use drugs.   Allergies  Allergen Reactions   Aspirin     Pt states she does not have an allergy to aspirn     Family History  Problem Relation Age of Onset   Hypertension Mother    Stroke Mother    Ovarian cancer Maternal Grandmother       Prior to Admission medications   Medication Sig Start Date End Date Taking? Authorizing Provider  amLODipine (NORVASC) 2.5 MG tablet Take 2.5 mg by mouth daily.    [provider]  amoxicillin (AMOXIL) 500 MG capsule Take 1,000 mg by mouth 2 (two) times daily. 02/14/20   [provider]  amoxicillin-clavulanate (AUGMENTIN) 875-125 MG tablet  10/23/19   [provider]  anastrozole (ARIMIDEX) 1 MG tablet Take 1 mg by mouth daily.    [provider]  apixaban (ELIQUIS) 5 MG TABS tablet Take 1 tablet (5 mg total) by mouth 2 (two) times daily. 03/15/13   Monika Salk, MD  aspirin 81 MG chewable tablet Chew 1 tablet (81 mg total) by mouth daily. 09/29/19   Little, Wenda Overland, MD  bisoprolol (ZEBETA) 10 MG tablet Take 10 mg by mouth every evening.  12/05/14 04/14/19  [provider]  bisoprolol (ZEBETA) 5 MG tablet  05/03/20   [provider]  Cholecalciferol 25 MCG (1000 UT) tablet Take by mouth.    [provider]  denosumab (PROLIA) 60 MG/ML SOSY injection 60 mg     [provider]  diltiazem (CARDIZEM SR) 60 MG 12 hr capsule Take 1 capsule (60 mg total) by mouth every 12 (twelve) hours. 03/15/13   Monika Salk, MD  DULoxetine (CYMBALTA) 30 MG capsule Take 30 mg by mouth daily.    [provider]  erythromycin ophthalmic ointment 1 application at bedtime as needed.    [provider]  estradiol (ESTRACE) 0.1 MG/GM vaginal cream Place 1 Applicatorful vaginally 2 (two) times a week.    [provider]  gabapentin (NEURONTIN) 300 MG capsule  10/23/19   [provider]  HYDROcodone-acetaminophen (NORCO/VICODIN) 5-325 MG tablet Take 1 tablet by mouth every 6 (six) hours as needed for severe pain. 03/26/21   Blanchie Dessert, MD  nitrofurantoin, macrocrystal-monohydrate, (MACROBID) 100 MG capsule 1 capsule at bedtime with food 03/25/20   [provider]  nitrofurantoin, macrocrystal-monohydrate, (MACROBID) 100 MG capsule  03/25/20   [provider]  nitroGLYCERIN (NITRO-BID) 2 % ointment Apply 0.5 inches topically 3 (three) times daily. 11/13/19   Felipa Furnace, DPM  spironolactone (ALDACTONE) 25 MG tablet Take 25 mg by mouth daily.    [provider]  valsartan (DIOVAN) 160 MG tablet Take 80 mg by mouth daily.     [provider]  Vitamin D, Ergocalciferol, (DRISDOL) 1.25 MG (50000 UNIT) CAPS capsule Take 50,000 Units by mouth every 7 (seven) days.    [provider]    Physical Exam: BP 101/64 (BP Location: Right Arm)   Pulse (!) 58   Temp 97.9 F (36.6 C) (Oral)   Resp 17   Ht 5\' 6"  (1.676 m)   Wt 66.7 kg   SpO2 92%   BMI 23.72 kg/m   General: 87 y.o. year-old female well developed well nourished in no acute distress.  Alert with mild confusion.  Hard of hearing. Cardiovascular: Regular rate and rhythm with no rubs or gallops.  No thyromegaly or JVD noted.  No lower extremity edema. 2/4 pulses in all 4 extremities. Respiratory: Clear to auscultation with no wheezes or  rales. Good inspiratory effort. Abdomen: Soft nontender nondistended with normal bowel sounds x4 quadrants. Muskuloskeletal: No cyanosis, clubbing or edema noted bilaterally Neuro: CN II-XII intact, strength, sensation, reflexes Skin: No ulcerative lesions noted or rashes Psychiatry: Judgement and insight appear slightly altered. Mood is appropriate for condition and setting          Labs on Admission:  Basic Metabolic Panel: Recent Labs  Lab 04/16/21 1209  NA 136  K 4.8  CL 99  CO2 28  GLUCOSE 128*  BUN 23  CREATININE 0.83  CALCIUM 9.4   Liver Function Tests: Recent Labs  Lab 04/16/21 1209  AST 23  ALT 19  ALKPHOS 36*  BILITOT 1.5*  PROT 6.8  ALBUMIN 3.9   No results for input(s): LIPASE, AMYLASE in the last 168  hours. No results for input(s): AMMONIA in the last 168 hours. CBC: Recent Labs  Lab 04/16/21 1209  WBC 13.9*  HGB 15.5*  HCT 45.8  MCV 96.6  PLT 226   Cardiac Enzymes: No results for input(s): CKTOTAL, CKMB, CKMBINDEX, TROPONINI in the last 168 hours.  BNP (last 3 results) No results for input(s): BNP in the last 8760 hours.  ProBNP (last 3 results) No results for input(s): PROBNP in the last 8760 hours.  CBG: No results for input(s): GLUCAP in the last 168 hours.  Radiological Exams on Admission: CT HEAD WO CONTRAST  Result Date: 04/16/2021 CLINICAL DATA:  Head trauma. EXAM: CT HEAD WITHOUT CONTRAST TECHNIQUE: Contiguous axial images were obtained from the base of the skull through the vertex without intravenous contrast. COMPARISON:  03/26/2021 FINDINGS: Brain: There is no evidence for acute hemorrhage, hydrocephalus, or abnormal extra-axial fluid collection. No definite CT evidence for acute infarction. Diffuse loss of parenchymal volume is consistent with atrophy. Patchy low attenuation in the deep hemispheric and periventricular white matter is nonspecific, but likely reflects chronic microvascular ischemic demyelination. 10 mm calcified  left frontal meningioma is stable. Chronic posterior right frontal lobe infarct again noted. Old right occipital lobe infarct again noted. Vascular: No hyperdense vessel or unexpected calcification. Skull: No evidence for fracture. No worrisome lytic or sclerotic lesion. Sinuses/Orbits: The visualized paranasal sinuses and mastoid air cells are clear. Visualized portions of the globes and intraorbital fat are unremarkable. Other: None. IMPRESSION: 1. Stable.  No acute intracranial abnormality. 2. Atrophy with chronic small vessel white matter ischemic disease. 3. Stable 10 mm calcified left frontal meningioma. 4. Old right occipital lobe infarct. Electronically Signed   By: Misty Stanley M.D.   On: 04/16/2021 11:37   CT Angio Chest PE W and/or Wo Contrast  Result Date: 04/16/2021 CLINICAL DATA:  Unresponsive EXAM: CT ANGIOGRAPHY CHEST WITH CONTRAST TECHNIQUE: Multidetector CT imaging of the chest was performed using the standard protocol during bolus administration of intravenous contrast. Multiplanar CT image reconstructions and MIPs were obtained to evaluate the vascular anatomy. CONTRAST:  21mL OMNIPAQUE IOHEXOL 350 MG/ML SOLN COMPARISON:  Chest x-ray earlier the same day FINDINGS: Cardiovascular: No pulmonary embolism identified. Main pulmonary artery is normal caliber. Heart is enlarged with no pericardial effusion identified. Thoracic aorta is tortuous and normal caliber. Mediastinum/Nodes: A few mildly enlarged mediastinal lymph nodes measuring up to 10 mm in short axis precarinal. No bulky hilar or axillary lymphadenopathy identified. Lungs/Pleura: No focal consolidation identified in the lungs. Mild subsegmental atelectatic changes bilaterally mostly in the lower lobes. Mild biapical pleural thickening. No pleural effusion or pneumothorax. Upper Abdomen: No acute process identified in the visualized upper abdomen. Colonic diverticulosis visualized. Musculoskeletal: Degenerative changes of the thoracic  spine. No suspicious bony lesions identified. Review of the MIP images confirms the above findings. IMPRESSION: 1. No pulmonary embolism identified. 2. Mild subsegmental atelectatic changes in the lungs. 3. Cardiomegaly. 4. Colonic diverticulosis. Electronically Signed   By: Ofilia Neas M.D.   On: 04/16/2021 13:49   DG Chest Port 1 View  Result Date: 04/16/2021 CLINICAL DATA:  Fall EXAM: PORTABLE CHEST 1 VIEW COMPARISON:  09/18/2017 FINDINGS: Transverse diameter of heart is increased. Mediastinum is unremarkable. There are no signs of pulmonary edema or new focal infiltrates. There is no significant pleural effusion or pneumothorax. Surgical clips are seen in right chest wall. IMPRESSION: Cardiomegaly. There are no signs of pulmonary edema or new focal infiltrates. Reading location: [place], [place]. Electronically Signed   By:  Elmer Picker M.D.   On: 04/16/2021 11:36    EKG: I independently viewed the EKG done and my findings are as followed: Atrial fibrillation with slow ventricular response, 54.  Nonspecific ST-T changes.  QTc 446.  Assessment/Plan Present on Admission:  UTI (urinary tract infection)  Active Problems:   UTI (urinary tract infection)  Sepsis secondary to presumptive UTI, POA. Presented with leukocytosis WBC greater than 13,000, tachypnea respiratory rate 26, UA positive for pyuria Generalized fatigue weakness, mild confusion Urine culture and blood cultures obtained, follow for ID and sensitivities. Rocephin started empirically in the ED, continue Monitor fever curve and WBC  Repeat CBC in the morning.  Questionable syncope/ fall suspect secondary to underlying condition, presumptive UTI, superimposed by dehydration. CT head nonacute.  Stable 10 mm calcified left frontal meningioma.  Old right occipital lobe infarct. Obtain orthostatic vital signs in the morning after IV fluid hydration. Continue gentle IV fluid hydration, LR, reduce rate to 50 cc/h x 2  days Continue to treat underlying conditions PT OT assessment in the morning Continue fall precautions Closely monitor on telemetry.  Acute metabolic encephalopathy suspect due to underlying condition Treat underlying conditions. Reorient as needed Delirium precautions.  Essential hypertension, Bps are soft Hold off home oral antihypertensives due to soft BPs Resume home oral antihypertensive as blood pressure permits. Closely monitor vital signs Maintain MAP greater than 65  Chronic A. fib on Eliquis Resume home Eliquis for secondary CVA prevention Hold off home bisoprolol for rate control due to slow ventricular response, rate of 54. Monitor on telemetry.  Chronic anxiety/depression Resume home regimen.  Chronic diastolic CHF Closely monitor volume status while on gentle IV fluid hydration Start strict I's and O's and daily weight Resume home cardiac medications    DVT prophylaxis: Eliquis  Code Status: Full code  Family Communication: None at bedside  Disposition Plan: Admitted to progressive unit  Consults called: None  Admission status: Observation status.   Status is: Observation        Kayleen Memos MD [facility] Pager [phone]  If 7PM-7AM, please contact night-coverage [email or web address] Password [facility]  04/16/2021, 8:41 PM

## 2021-04-16 NOTE — ED Triage Notes (Addendum)
Pt BIB daughter, pt found by friend in her dressing room face first on the floor. Pt is on blood thinners, alert but not answering questions. Per daughter pt in not her nor, usually talking and a/o x4  Per daughter pt reports all of her belongings from her marble top dresser was on the floor. Unsure if pt hit her head, pt was found face down on a carpeted floor.   Pt recently completed a 5 day ABX regimen for UTI  D/t ongoing back pain they started her on a 2nd ABX regimen.

## 2021-04-17 ENCOUNTER — Encounter (HOSPITAL_COMMUNITY): Payer: Self-pay | Admitting: Internal Medicine

## 2021-04-17 DIAGNOSIS — I5022 Chronic systolic (congestive) heart failure: Secondary | ICD-10-CM

## 2021-04-17 DIAGNOSIS — A419 Sepsis, unspecified organism: Secondary | ICD-10-CM | POA: Diagnosis not present

## 2021-04-17 DIAGNOSIS — I482 Chronic atrial fibrillation, unspecified: Secondary | ICD-10-CM

## 2021-04-17 DIAGNOSIS — F329 Major depressive disorder, single episode, unspecified: Secondary | ICD-10-CM

## 2021-04-17 DIAGNOSIS — G9341 Metabolic encephalopathy: Secondary | ICD-10-CM | POA: Diagnosis present

## 2021-04-17 DIAGNOSIS — N3000 Acute cystitis without hematuria: Secondary | ICD-10-CM | POA: Diagnosis not present

## 2021-04-17 DIAGNOSIS — R55 Syncope and collapse: Secondary | ICD-10-CM

## 2021-04-17 DIAGNOSIS — E860 Dehydration: Secondary | ICD-10-CM | POA: Diagnosis present

## 2021-04-17 DIAGNOSIS — N390 Urinary tract infection, site not specified: Secondary | ICD-10-CM

## 2021-04-17 DIAGNOSIS — I10 Essential (primary) hypertension: Secondary | ICD-10-CM

## 2021-04-17 LAB — CBC
HCT: 44.3 % (ref 36.0–46.0)
Hemoglobin: 14.2 g/dL (ref 12.0–15.0)
MCH: 32.5 pg (ref 26.0–34.0)
MCHC: 32.1 g/dL (ref 30.0–36.0)
MCV: 101.4 fL — ABNORMAL HIGH (ref 80.0–100.0)
Platelets: 209 10*3/uL (ref 150–400)
RBC: 4.37 MIL/uL (ref 3.87–5.11)
RDW: 12.8 % (ref 11.5–15.5)
WBC: 7.5 10*3/uL (ref 4.0–10.5)
nRBC: 0 % (ref 0.0–0.2)

## 2021-04-17 LAB — URINE CULTURE: Culture: NO GROWTH

## 2021-04-17 LAB — BASIC METABOLIC PANEL
Anion gap: 7 (ref 5–15)
BUN: 18 mg/dL (ref 8–23)
CO2: 25 mmol/L (ref 22–32)
Calcium: 8.9 mg/dL (ref 8.9–10.3)
Chloride: 106 mmol/L (ref 98–111)
Creatinine, Ser: 0.79 mg/dL (ref 0.44–1.00)
GFR, Estimated: >60 mL/min (ref >60)
Glucose, Bld: 110 mg/dL — ABNORMAL HIGH (ref 70–99)
Potassium: 4 mmol/L (ref 3.5–5.1)
Sodium: 138 mmol/L (ref 135–145)

## 2021-04-17 LAB — PHOSPHORUS: Phosphorus: 4 mg/dL (ref 2.5–4.6)

## 2021-04-17 LAB — MAGNESIUM: Magnesium: 1.4 mg/dL — ABNORMAL LOW (ref 1.7–2.4)

## 2021-04-17 MED ORDER — ACETAMINOPHEN 500 MG PO TABS
500.0000 mg | ORAL_TABLET | Freq: Three times a day (TID) | ORAL | Status: DC
  Filled 2021-04-17 (×2): qty 1

## 2021-04-17 MED ORDER — INFLUENZA VAC A&B SA ADJ QUAD 0.5 ML IM PRSY
0.5000 mL | PREFILLED_SYRINGE | INTRAMUSCULAR | Status: DC
  Filled 2021-04-17: qty 0.5

## 2021-04-17 MED ORDER — MAGNESIUM SULFATE 4 GM/100ML IV SOLN
4.0000 g | Freq: Once | INTRAVENOUS | Status: AC
  Administered 2021-04-17: 4 g via INTRAVENOUS
  Filled 2021-04-17: qty 100

## 2021-04-17 NOTE — Evaluation (Signed)
Occupational Therapy Evaluation Patient Details Name: Natalie Mendoza MRN: [id number] DOB: March 26, 1933 Today's Date: 04/17/2021   History of Present Illness Natalie Mendoza is a 87 y.o. female with medical history significant for chronic atrial fibrillation on Eliquis, essential hypertension, chronic diastolic CHF, hyperlipidemia, who presented due to confusion, dizziness, passing out and fall.  Pt admitted with sepsis due to UTI.   Clinical Impression   Patient is currently requiring light assistance with ADLs including min guard assist to supervision with toileting, standing LE dressing, and with bathing,  all of which is below patient's typical baseline of being Independent.  During this evaluation, patient was limited by mild confusion which improved once pt was out of bed, generalized weakness, impaired activity tolerance, which may have the potential to impact patient's safety and independence during functional mobility, as well as performance for ADLs.  Patient lives alone but reoprts that a friend who is expecting to have a surgery in 4 weeks will be living on pt's main floor. Pt also reports a daughter and son who are available PRN. Patient demonstrates good rehab potential, and should benefit from 1-2 more skilled occupational therapy visits while in acute care to maximize safety, independence and quality of life at home.  ?       Recommendations for follow up therapy are one component of a multi-disciplinary discharge planning process, led by the attending physician.  Recommendations may be updated based on patient status, additional functional criteria and insurance authorization.   Follow Up Recommendations  No OT follow up    Assistance Recommended at Discharge    Functional Status Assessment  Patient has had a recent decline in their functional status and demonstrates the ability to make significant improvements in function in a reasonable and predictable amount of time.   Equipment Recommendations  None recommended by OT    Recommendations for Other Services       Precautions / Restrictions Precautions Precautions: Fall Restrictions Weight Bearing Restrictions: No      Mobility Bed Mobility Overal bed mobility: Modified Independent                  Transfers Overall transfer level: Needs assistance   Transfers: Sit to/from Stand;Stand Pivot Transfers Sit to Stand: Modified independent (Device/Increase time) Stand pivot transfers: Supervision         General transfer comment: Orthostratic BPs completed and entered into Vitals      Balance Overall balance assessment: Mild deficits observed, not formally tested                                         ADL either performed or assessed with clinical judgement   ADL Overall ADL's : Needs assistance/impaired Eating/Feeding: Independent;Sitting Eating/Feeding Details (indicate cue type and reason): in chair Grooming: Wash/dry hands;Standing;Supervision/safety   Upper Body Bathing: Set up;Sitting   Lower Body Bathing: Min guard;Sit to/from stand   Upper Body Dressing : Set up;Sitting   Lower Body Dressing: Min guard;Sit to/from stand   Toilet Transfer: Copy Details (indicate cue type and reason): to recliner Toileting- Clothing Manipulation and Hygiene: Sit to/from stand;Supervision/safety;Sitting/lateral lean Toileting - Clothing Manipulation Details (indicate cue type and reason): per general assessment and pt denied need to void.     Functional mobility during ADLs: Supervision/safety       Vision Ability to See in Adequate Light: 1 Impaired Patient Visual  Report: No change from baseline Additional Comments: Pt presented with red irritated, watery eyes. Pt reports an ongoing "serious issue" with her eyes for which she is being followed by an eye doctor. Pt was unable to verbalize diagnosis but stated that her daughter  assists with eye drops.     Perception Perception Perception: Within Functional Limits   Praxis Praxis Praxis: Intact    Pertinent Vitals/Pain Pain Assessment: No/denies pain (eye irritation post daughter providing eye drops.)     Hand Dominance Right   Extremity/Trunk Assessment Upper Extremity Assessment Upper Extremity Assessment: Overall WFL for tasks assessed   Lower Extremity Assessment Lower Extremity Assessment: Defer to PT evaluation   Cervical / Trunk Assessment Cervical / Trunk Assessment: Normal   Communication Communication Communication: Expressive difficulties;HOH;Other (comment) (Tripping over words often.)   Cognition                                             General Comments  Good sitting. Fair to good standing. Supervision for safety due to recent LOC.  Pt denied actual falls when awake. Only fall was from LOC.    Exercises     Shoulder Instructions      Home Living Family/patient expects to be discharged to:: Private residence     Type of Home: House Home Access: Stairs to enter [employer] of Steps: 2 Entrance Stairs-Rails: None Home Layout: Multi-level;Bed/bath upstairs Alternate Level Stairs-Number of Steps: 10 steps up to bedroom/bathroom level. Pt's friend will be staying on the main level. Alternate Level Stairs-Rails: Right Bathroom Shower/Tub: Occupational psychologist: Standard     Home Equipment: Shower seat - built in;Cane - single point;Hand held shower head   Additional Comments: I have like 50 canes"  Pt reports plan to have shower grab bars installed.      Prior Functioning/Environment Prior Level of Function : Independent/Modified Independent;Driving;History of Falls (last six months);Patient poor historian/Family not available               ADLs Comments: Pt reports Independence with self care and has a housekeeper every other week.  Daughter and son provide PRN assistance.         OT Problem List: Impaired vision/perception;Cardiopulmonary status limiting activity      OT Treatment/Interventions: Self-care/ADL training;Therapeutic activities;Balance training;Visual/perceptual remediation/compensation;Patient/family education;DME and/or AE instruction    OT Goals(Current goals can be found in the care plan section) Acute Rehab OT Goals Patient Stated Goal: To find out what caused the fainting and fall. OT Goal Formulation: With patient Time For Goal Achievement: 05/01/21 Potential to Achieve Goals: Good ADL Goals Additional ADL Goal #1: Pt will engage in at least 8 min static and dynamic standing functional activities without loss of balance, in order to demonstrate improved activity tolerance and balance needed to perform ADLs safely at home.  Vitals stable.  OT Frequency: Other (comment) (1-2 more OT sessions.)   Barriers to D/C:            Co-evaluation              AM-PAC OT "6 Clicks" Daily Activity     Outcome Measure Help from another person eating meals?: None Help from another person taking care of personal grooming?: None Help from another person toileting, which includes using toliet, bedpan, or urinal?: A Little Help from another person bathing (including washing,  rinsing, drying)?: A Little Help from another person to put on and taking off regular upper body clothing?: None Help from another person to put on and taking off regular lower body clothing?: A Little 6 Click Score: 21   End of Session Equipment Utilized During Treatment: Gait belt Nurse Communication: Mobility status;Other (comment) (Orthostatics)  Activity Tolerance: Patient tolerated treatment well Patient left: in chair;with call bell/phone within reach;with chair alarm set  OT Visit Diagnosis: History of falling (Z91.81)                Time: 9437-0052 OT Time Calculation (min): 47 min Charges:  OT General Charges $OT Visit: 1 Visit OT Evaluation $OT Eval Low  Complexity: 1 Low OT Treatments $Self Care/Home Management : 8-22 mins $Therapeutic Activity: 8-22 mins  Hennick Malta, OT Acute Rehab Services Office: [phone] 04/17/2021  Julien Girt 04/17/2021, 9:32 AM

## 2021-04-17 NOTE — Progress Notes (Addendum)
PROGRESS NOTE    Natalie Mendoza  CVE:[id number] DOB: August 28, 1932 DOA: 04/16/2021 PCP: Cari Caraway, MD   Chief Complaint  Patient presents with   Fall    Brief Narrative:  Patient is a 87 year old lady with history of chronic A. fib on Eliquis, hypertension, chronic diastolic CHF, hyperlipidemia presented to the ED with dizziness, confusion, passing out and a fall.  Patient with generalized weakness and fatigue the morning of admission went to the bathroom felt dizzy went back to the bedroom and passed out.  Unclear whether patient lost consciousness and how long she was unconscious for.  Patient noted to found herself on the floor and felt dizzy prior to passing out.  Patient noted on admission to have some mild confusion.  Patient noted to have recently been treated for UTI in the outpatient setting and finished a 5-day course of antibiotics.  Patient seen by PCP 2 days prior to admission with complaints of back discomfort and resumed back on oral antibiotics.  Patient presented to freestanding ED and noted to be septic secondary to UTI.  Patient pancultured.  Patient placed empirically on IV antibiotics.  Patient also noted to have borderline blood pressure of 101/64 on presentation.   Assessment & Plan:   Principal Problem:   Sepsis secondary to UTI [facility]) Active Problems:   HTN (hypertension)   Depression   Chronic atrial fibrillation (HCC)   Chronic systolic CHF (congestive heart failure) (HCC)   UTI (urinary tract infection)   Dehydration   Acute metabolic encephalopathy   Syncope: Questionable  #1 sepsis secondary to UTI, POA -Patient on admission met criteria for sepsis with leukocytosis, tachypnea with a respiratory rate of 26, urinalysis positive for pyuria, generalized fatigue, weakness and mild confusion. -Patient pancultured cultures pending. -Chest x-ray negative for any acute infiltrate. -CT angiogram chest negative for PE and negative for any  infiltrate. -Patient currently afebrile. -Leukocytosis is trended down. -Continue empiric IV Rocephin pending culture results. -Supportive care.  2.??  Syncope/fall likely secondary to problem #1 superimposed on dehydration -On admission patient noted to have a soft blood pressure.  Urinalysis consistent with a UTI. -CT head negative for any acute abnormalities. -Patient pancultured with cultures pending. -Continue empiric IV Rocephin, IV fluids, supportive care. -PT/OT.  3.  Acute metabolic encephalopathy -Likely secondary to acute infection and dehydration. -Patient clinically slowly improving. -Continue IV antibiotics, IV fluids, supportive care.  4.  Hypertension -Blood pressures noted to be soft on admission. -Continue to hold home oral antihypertensive medications. -IV fluids. -Supportive care.  5.  Chronic A. fib on Eliquis -Continue to hold bisoprolol as patient noted to have a slow ventricular rate on admission of 54. -Eliquis for anticoagulation.  6.  Depression/anxiety -Continue home regimen Cymbalta.  7.  Chronic diastolic CHF -Euvolemic on examination. -Monitor volume status closely with hydration.  8.  Hypomagnesemia -Magnesium at 1.4. -Magnesium sulfate 4 g IV x1. -Repeat labs in the morning.   DVT prophylaxis: Eliquis Code Status: Full Family Communication: Updated patient.  No family at bedside Disposition:   Status is: Inpatient  The patient will require care spanning > 2 midnights and should be moved to inpatient because: Inpatient     Consultants:  None  Procedures:  CT angiogram chest 04/16/2021 CT head 04/16/2021 Chest x-ray 04/16/2021  Antimicrobials:  IV Rocephin 04/16/2021>>>>>   Subjective: Patient laying in bed.  Alert to self place and time.  Some slow responses.  Confusion improving.  No chest pain.  No shortness of breath.  No abdominal pain.  No flank pain.  No dysuria.  Patient stated had bilateral flank pain early on in  the week that has improved.  Objective: Vitals:   04/16/21 2337 04/17/21 0515 04/17/21 1005 04/17/21 1815  BP: 127/87 (!) 145/96 133/73 136/87  Pulse: 60 61 69 60  Resp: 20 20 18 18   Temp:   98.5 F (36.9 C) (!) 97.4 F (36.3 C)  TempSrc:      SpO2: 94% 93% 96% 95%  Weight:      Height:        Intake/Output Summary (Last 24 hours) at 04/17/2021 1909 Last data filed at 04/17/2021 1600 Gross per 24 hour  Intake 874.23 ml  Output --  Net 874.23 ml   Filed Weights   04/16/21 1102  Weight: 66.7 kg    Examination:  General exam: Appears calm and comfortable.  Dry mucous membranes. Respiratory system: Clear to auscultation bilaterally.  No wheezes, no crackles, no rhonchi.Marland Kitchen Respiratory effort normal. Cardiovascular system: S1 & S2 heard, RRR. No JVD, murmurs, rubs, gallops or clicks. No pedal edema. Gastrointestinal system: Abdomen is nondistended, soft and nontender. No organomegaly or masses felt. Normal bowel sounds heard. Central nervous system: Alert and oriented. No focal neurological deficits.  MD spontaneously. Extremities: Symmetric 5 x 5 power. Skin: No rashes, lesions or ulcers Psychiatry: Judgement and insight appear fair. Mood & affect appropriate.     Data Reviewed: I have personally reviewed following labs and imaging studies  CBC: Recent Labs  Lab 04/16/21 1209 04/17/21 0426  WBC 13.9* 7.5  HGB 15.5* 14.2  HCT 45.8 44.3  MCV 96.6 101.4*  PLT 226 885    Basic Metabolic Panel: Recent Labs  Lab 04/16/21 1209 04/17/21 0426  NA 136 138  K 4.8 4.0  CL 99 106  CO2 28 25  GLUCOSE 128* 110*  BUN 23 18  CREATININE 0.83 0.79  CALCIUM 9.4 8.9  MG  --  1.4*  PHOS  --  4.0    GFR: Estimated Creatinine Clearance: 48.1 mL/min (by C-G formula based on SCr of 0.79 mg/dL).  Liver Function Tests: Recent Labs  Lab 04/16/21 1209  AST 23  ALT 19  ALKPHOS 36*  BILITOT 1.5*  PROT 6.8  ALBUMIN 3.9    CBG: No results for input(s): GLUCAP in the  last 168 hours.   Recent Results (from the past 240 hour(s))  Culture, blood (single)     Status: None (Preliminary result)   Collection Time: 04/16/21 12:08 PM   Specimen: BLOOD RIGHT FOREARM  Result Value Ref Range Status   Specimen Description BLOOD RIGHT FOREARM  Final   Special Requests   Final    BOTTLES DRAWN AEROBIC AND ANAEROBIC Blood Culture adequate volume   Culture   Final    NO GROWTH < 24 HOURS Performed at [facility], [address] [place], [place] [address]    Report Status PENDING  Incomplete  Resp Panel by RT-PCR (Flu A&B, Covid)     Status: None   Collection Time: 04/16/21 12:09 PM   Specimen: Nasopharyngeal(NP) swabs in vial transport medium  Result Value Ref Range Status   SARS Coronavirus 2 by RT PCR NEGATIVE NEGATIVE Final    Comment: (NOTE) SARS-CoV-2 target nucleic acids are NOT DETECTED.  The SARS-CoV-2 RNA is generally detectable in upper respiratory specimens during the acute phase of infection. The lowest concentration of SARS-CoV-2 viral copies this assay can detect is 138 copies/mL. A negative result does  not preclude SARS-Cov-2 infection and should not be used as the sole basis for treatment or other patient management decisions. A negative result may occur with  improper specimen collection/handling, submission of specimen other than nasopharyngeal swab, presence of viral mutation(s) within the areas targeted by this assay, and inadequate number of viral copies(<138 copies/mL). A negative result must be combined with clinical observations, patient history, and epidemiological information. The expected result is Negative.  Fact Sheet for Patients:  EntrepreneurPulse.com.au  Fact Sheet for Healthcare Providers:  IncredibleEmployment.be  This test is no t yet approved or cleared by the [place] FDA and  has been authorized for detection and/or diagnosis of SARS-CoV-2 by FDA under an Emergency  Use Authorization (EUA). This EUA will remain  in effect (meaning this test can be used) for the duration of the COVID-19 declaration under Section 564(b)(1) of the Act, 21 U.S.C.section 360bbb-3(b)(1), unless the authorization is terminated  or revoked sooner.       Influenza A by PCR NEGATIVE NEGATIVE Final   Influenza B by PCR NEGATIVE NEGATIVE Final    Comment: (NOTE) The Xpert Xpress SARS-CoV-2/FLU/RSV plus assay is intended as an aid in the diagnosis of influenza from Nasopharyngeal swab specimens and should not be used as a sole basis for treatment. Nasal washings and aspirates are unacceptable for Xpert Xpress SARS-CoV-2/FLU/RSV testing.  Fact Sheet for Patients: EntrepreneurPulse.com.au  Fact Sheet for Healthcare Providers: IncredibleEmployment.be  This test is not yet approved or cleared by the [place] FDA and has been authorized for detection and/or diagnosis of SARS-CoV-2 by FDA under an Emergency Use Authorization (EUA). This EUA will remain in effect (meaning this test can be used) for the duration of the COVID-19 declaration under Section 564(b)(1) of the Act, 21 U.S.C. section 360bbb-3(b)(1), unless the authorization is terminated or revoked.  Performed at [facility], [address] [place], [place] [address]   Culture, blood (single)     Status: None (Preliminary result)   Collection Time: 04/16/21  2:20 PM   Specimen: BLOOD LEFT FOREARM  Result Value Ref Range Status   Specimen Description   Final    BLOOD LEFT FOREARM Performed at [facility], [address] [place], [place] [address]    Special Requests   Final    BOTTLES DRAWN AEROBIC AND ANAEROBIC Blood Culture adequate volume   Culture   Final    NO GROWTH < 24 HOURS Performed at [facility] [address] [place],  [address]    Report Status PENDING  Incomplete         Radiology  Studies: CT HEAD WO CONTRAST  Result Date: 04/16/2021 CLINICAL DATA:  Head trauma. EXAM: CT HEAD WITHOUT CONTRAST TECHNIQUE: Contiguous axial images were obtained from the base of the skull through the vertex without intravenous contrast. COMPARISON:  03/26/2021 FINDINGS: Brain: There is no evidence for acute hemorrhage, hydrocephalus, or abnormal extra-axial fluid collection. No definite CT evidence for acute infarction. Diffuse loss of parenchymal volume is consistent with atrophy. Patchy low attenuation in the deep hemispheric and periventricular white matter is nonspecific, but likely reflects chronic microvascular ischemic demyelination. 10 mm calcified left frontal meningioma is stable. Chronic posterior right frontal lobe infarct again noted. Old right occipital lobe infarct again noted. Vascular: No hyperdense vessel or unexpected calcification. Skull: No evidence for fracture. No worrisome lytic or sclerotic lesion. Sinuses/Orbits: The visualized paranasal sinuses and mastoid air cells are clear. Visualized portions of the globes and intraorbital fat are  unremarkable. Other: None. IMPRESSION: 1. Stable.  No acute intracranial abnormality. 2. Atrophy with chronic small vessel white matter ischemic disease. 3. Stable 10 mm calcified left frontal meningioma. 4. Old right occipital lobe infarct. Electronically Signed   By: Misty Stanley M.D.   On: 04/16/2021 11:37   CT Angio Chest PE W and/or Wo Contrast  Result Date: 04/16/2021 CLINICAL DATA:  Unresponsive EXAM: CT ANGIOGRAPHY CHEST WITH CONTRAST TECHNIQUE: Multidetector CT imaging of the chest was performed using the standard protocol during bolus administration of intravenous contrast. Multiplanar CT image reconstructions and MIPs were obtained to evaluate the vascular anatomy. CONTRAST:  72m OMNIPAQUE IOHEXOL 350 MG/ML SOLN COMPARISON:  Chest x-ray earlier the same day FINDINGS: Cardiovascular: No pulmonary embolism identified. Main pulmonary  artery is normal caliber. Heart is enlarged with no pericardial effusion identified. Thoracic aorta is tortuous and normal caliber. Mediastinum/Nodes: A few mildly enlarged mediastinal lymph nodes measuring up to 10 mm in short axis precarinal. No bulky hilar or axillary lymphadenopathy identified. Lungs/Pleura: No focal consolidation identified in the lungs. Mild subsegmental atelectatic changes bilaterally mostly in the lower lobes. Mild biapical pleural thickening. No pleural effusion or pneumothorax. Upper Abdomen: No acute process identified in the visualized upper abdomen. Colonic diverticulosis visualized. Musculoskeletal: Degenerative changes of the thoracic spine. No suspicious bony lesions identified. Review of the MIP images confirms the above findings. IMPRESSION: 1. No pulmonary embolism identified. 2. Mild subsegmental atelectatic changes in the lungs. 3. Cardiomegaly. 4. Colonic diverticulosis. Electronically Signed   By: DOfilia NeasM.D.   On: 04/16/2021 13:49   DG Chest Port 1 View  Result Date: 04/16/2021 CLINICAL DATA:  Fall EXAM: PORTABLE CHEST 1 VIEW COMPARISON:  09/18/2017 FINDINGS: Transverse diameter of heart is increased. Mediastinum is unremarkable. There are no signs of pulmonary edema or new focal infiltrates. There is no significant pleural effusion or pneumothorax. Surgical clips are seen in right chest wall. IMPRESSION: Cardiomegaly. There are no signs of pulmonary edema or new focal infiltrates. [place]: [place] Electronically Signed   By: PElmer PickerM.D.   On: 04/16/2021 11:36        Scheduled Meds:  acetaminophen  500 mg Oral TID   apixaban  5 mg Oral BID   DULoxetine  30 mg Oral QHS   [START ON 04/18/2021] influenza vaccine adjuvanted  0.5 mL Intramuscular Tomorrow-1000   Continuous Infusions:  cefTRIAXone (ROCEPHIN)  IV 2 g (04/17/21 1230)   lactated ringers 75 mL/hr at 04/17/21 1225     LOS: 0 days    Time spent: 40  minutes    DIrine Seal MD [facility]   To contact the attending provider between 7A-7P or the covering provider during after hours 7P-7A, please log into the web site [email or web address] and access using universal [place] password for that web site. If you do not have the password, please call the hospital operator.  04/17/2021, 7:09 PM

## 2021-04-17 NOTE — Evaluation (Signed)
Physical Therapy Evaluation Patient Details Name: Natalie Mendoza MRN: [id number] DOB: 1933/02/03 Today's Date: 04/17/2021  History of Present Illness  87yo female who presented on 10/27 with confusion, dizziness, syncope with fall. Found to have septic UTI at [facility] ED and transferred to [facility] for further care. PMH Afib, breast CA, CHF, HOH, HTN, OA, osteoporosis, TIA, L TKR  Clinical Impression   Patient received in bed, pleasant and cooperative. HOH and tells me she took a 25 minute shower with her hearing aides in; family not present to advise regarding baseline cog status- hard to tell today if she just couldn't fully hear instructions or if she is still a bit confused. Able to mobilize really well today with supervision assist given for safety and line management, gait distance ultimately limited by back pain. Suspect she is likely close to her functional baseline, will stay in course of care to challenge cognition and higher level balance especially given fall with LOC. Left up in recliner with all needs met, chair alarm active and nursing staff aware  of pt status. No need for skilled PT f/u, would likely benefit from more frequent supervision upon DC though.        Recommendations for follow up therapy are one component of a multi-disciplinary discharge planning process, led by the attending physician.  Recommendations may be updated based on patient status, additional functional criteria and insurance authorization.  Follow Up Recommendations No PT follow up    Assistance Recommended at Discharge Frequent or constant Supervision/Assistance  Functional Status Assessment Patient has not had a recent decline in their functional status  Equipment Recommendations  None recommended by PT    Recommendations for Other Services       Precautions / Restrictions Precautions Precautions: Fall Restrictions Weight Bearing Restrictions: No      Mobility  Bed Mobility Overal  bed mobility: Modified Independent             General bed mobility comments: HOB elevated    Transfers Overall transfer level: Needs assistance Equipment used: None Transfers: Sit to/from Stand Sit to Stand: Supervision           General transfer comment: no physical assist given, S for safety with lines    Ambulation/Gait Ambulation/Gait assistance: Supervision Gait Distance (Feet): 180 Feet Assistive device: Rolling walker (2 wheels);None Gait Pattern/deviations: Step-through pattern;WFL(Within Functional Limits) Gait velocity: WNL   General Gait Details: steady both with RW (145f) and without device (869f; ultimately limited by back pain but really moving well  Stairs            Wheelchair Mobility    Modified Rankin (Stroke Patients Only)       Balance Overall balance assessment: Mild deficits observed, not formally tested                                           Pertinent Vitals/Pain Pain Assessment: Faces Pain Score: 0-No pain Faces Pain Scale: No hurt    Home Living Family/patient expects to be discharged to:: Private residence     Type of Home: House Home Access: Stairs to enter Entrance Stairs-Rails: None Entrance Stairs-Number of Steps: 2 Alternate Level Stairs-Number of Steps: 10 steps up to bedroom/bathroom level. Pt's friend will be staying on the main level. Home Layout: Multi-level;Bed/bath upstairs Home Equipment: Shower seat - built in;Cane - single point;Hand held shower head Additional  Comments: I have like 50 canes"  Pt reports plan to have shower grab bars installed.    Prior Function Prior Level of Function : Independent/Modified Independent;Driving;History of Falls (last six months);Patient poor historian/Family not available               ADLs Comments: Pt reports Independence with self care and has a housekeeper every other week.  Daughter and son provide PRN assistance.     Hand Dominance    Dominant Hand: Right    Extremity/Trunk Assessment   Upper Extremity Assessment Upper Extremity Assessment: Defer to OT evaluation    Lower Extremity Assessment Lower Extremity Assessment: Overall WFL for tasks assessed    Cervical / Trunk Assessment Cervical / Trunk Assessment: Normal  Communication   Communication: Expressive difficulties;HOH;Other (comment) (tripping over words often)  Cognition Arousal/Alertness: Awake/alert Behavior During Therapy: WFL for tasks assessed/performed Overall Cognitive Status: No family/caregiver present to determine baseline cognitive functioning                                 General Comments: unsure if she is still a bit confused vs just not understanding what therapist wanted due to hearing loss- cooperative and pleasant, however        General Comments      Exercises     Assessment/Plan    PT Assessment Patient needs continued PT services  PT Problem List Decreased cognition;Decreased knowledge of use of DME;Decreased activity tolerance;Decreased safety awareness;Decreased balance;Decreased mobility       PT Treatment Interventions DME instruction;Balance training;Gait training;Stair training;Cognitive remediation;Functional mobility training;Patient/family education;Therapeutic activities;Therapeutic exercise    PT Goals (Current goals can be found in the Care Plan section)  Acute Rehab PT Goals Patient Stated Goal: go for a walk PT Goal Formulation: With patient Time For Goal Achievement: 05/01/21 Potential to Achieve Goals: Good    Frequency Min 2X/week   Barriers to discharge        Co-evaluation               AM-PAC PT "6 Clicks" Mobility  Outcome Measure Help needed turning from your back to your side while in a flat bed without using bedrails?: None Help needed moving from lying on your back to sitting on the side of a flat bed without using bedrails?: None Help needed moving to and from  a bed to a chair (including a wheelchair)?: None Help needed standing up from a chair using your arms (e.g., wheelchair or bedside chair)?: None Help needed to walk in hospital room?: A Little Help needed climbing 3-5 steps with a railing? : A Little 6 Click Score: 22    End of Session Equipment Utilized During Treatment: Gait belt Activity Tolerance: Patient tolerated treatment well Patient left: in chair;with call bell/phone within reach;with chair alarm set Nurse Communication: Mobility status PT Visit Diagnosis: Muscle weakness (generalized) (M62.81);History of falling (Z91.81)    Time: 1345-1401 PT Time Calculation (min) (ACUTE ONLY): 16 min   Charges:   PT Evaluation $PT Eval Moderate Complexity: 1 Mod         Macklen Wilhoite U PT, DPT, PN2   Supplemental Physical Therapist [place]    Pager [phone] Acute Rehab Office [phone]

## 2021-04-17 NOTE — TOC Initial Note (Signed)
Transition of Care [facility]) - Initial/Assessment Note    Patient Details  Name: Natalie Mendoza MRN: [id number] Date of Birth: [birth year]  Transition of Care [facility]) CM/SW Contact:    Leeroy Cha, RN Phone Number: 04/17/2021, 7:49 AM  Clinical Narrative:                  87 y.o. female with medical history significant for chronic atrial fibrillation on Eliquis, essential hypertension, chronic diastolic CHF, hyperlipidemia, who presented due to confusion, dizziness, passing out and fall.  Associated with generalized weakness and fatigue this morning.  She went to the bathroom, felt dizzy, walked back to her bedroom, and passed out.  Unclear if she lost consciousness or how long she was unconscious.  She states she just found herself on the floor and felt dizzy prior to passing out.  At the time of this exam she appears to have mild confusion.  Patient was recently treated outpatient for urinary tract infection and finished a 5-day course of antibiotics.  She saw her primary care provider 2 days ago due to back discomfort, she was restarted on her p.o. antibiotics.  She presented to [facility] ED for further evaluation.  Work-up in the ED revealed sepsis secondary to UTI.  Cultures were obtained and she was started on Rocephin empirically.   TOC PLAN OF CARE: Plan is to return home with self care.  Following for progression and toc needs. Expected Discharge Plan: Home/Self Care Barriers to Discharge: Continued Medical Work up   Patient Goals and CMS Choice Patient states their goals for this hospitalization and ongoing recovery are:: i would like to go home and be well CMS Medicare.gov Compare Post Acute Care list provided to:: Patient    Expected Discharge Plan and Services Expected Discharge Plan: Home/Self Care   Discharge Planning Services: CM Consult   Living arrangements for the past 2 months: Single Family Home                                      Prior Living  Arrangements/Services Living arrangements for the past 2 months: Single Family Home Lives with:: Self Patient language and need for interpreter reviewed:: Yes Do you feel safe going back to the place where you live?: Yes      Need for Family Participation in Patient Care: No (Comment) Care giver support system in place?: No (comment)   Criminal Activity/Legal Involvement Pertinent to Current Situation/Hospitalization: No - Comment as needed  Activities of Daily Living Home Assistive Devices/Equipment: None ADL Screening (condition at time of admission) Patient's cognitive ability adequate to safely complete daily activities?: Yes Is the patient deaf or have difficulty hearing?: No Does the patient have difficulty seeing, even when wearing glasses/contacts?: No Does the patient have difficulty concentrating, remembering, or making decisions?: No Patient able to express need for assistance with ADLs?: Yes Does the patient have difficulty dressing or bathing?: No Independently performs ADLs?: Yes (appropriate for developmental age) Does the patient have difficulty walking or climbing stairs?: No Weakness of Legs: None Weakness of Arms/Hands: None  Permission Sought/Granted                  Emotional Assessment Appearance:: Appears stated age     Orientation: : Oriented to Self, Oriented to Place, Oriented to  Time, Oriented to Situation Alcohol / Substance Use: Not Applicable Psych Involvement: No (comment)  Admission diagnosis:  UTI (urinary tract infection) [N39.0] Fall [W19.XXXA] Acute cystitis without hematuria [N30.00] Syncope, unspecified syncope type [R55] Sepsis, due to unspecified organism, unspecified whether acute organ dysfunction present [facility]) [A41.9] Patient Active Problem List   Diagnosis Date Noted   UTI (urinary tract infection) 04/16/2021   Recurrent urinary tract infection 01/02/2020   Vaginal ulcer 01/02/2020   Chronic venous insufficiency 10/05/2019    Type 2 diabetes mellitus without complication, without long-term current use of insulin ([place]) 08/15/2019   Vitamin D deficiency 07/24/2019   Status post left knee replacement 03/22/2019   Hypomagnesemia 03/09/2019   Acute postoperative pain 03/06/2019   SOB (shortness of breath) 12/15/2017   Hyperlipidemia    Sore throat    Chronic atrial fibrillation ([place]) 80/22/3361   Chronic systolic CHF (congestive heart failure) ([place]) 08/01/2016   Abdominal distension 11/23/2015   Nuclear sclerotic cataract of both eyes 03/29/2014   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/29/2014   Osteoporosis with fracture 08/28/2013   Left-sided weakness 03/13/2013   TIA (transient ischemic attack) 03/13/2013   HTN (hypertension) 03/13/2013   Dyslipidemia 03/13/2013   Depression 03/13/2013   Breast cancer ([place]) 09/01/2012   Deafness, sensorineural 07/31/2012   Hearing aid worn 07/31/2012   Other seborrheic keratosis 04/05/2011   Xerosis cutis 04/05/2011   Anxiety 03/17/2011   Sleep related leg cramps 12/06/2007   Major depressive disorder, single episode 12/03/2007   Arthropathia 11/28/2007   Age-related osteoporosis without current pathological fracture 11/28/2007   Asymptomatic varicose veins 11/28/2007   Pain in limb 11/28/2007   Swelling of limb 11/28/2007   PCP:  Cari Caraway, MD Pharmacy:   [facility], [place] [address] Phone: [phone] Fax: [place], [address] AT [facility] [address] [place] [address] Phone: [phone] Fax: [phone]     Social Determinants of Health (SDOH) Interventions    Readmission Risk Interventions No flowsheet data found.

## 2021-04-18 DIAGNOSIS — F419 Anxiety disorder, unspecified: Secondary | ICD-10-CM | POA: Diagnosis present

## 2021-04-18 DIAGNOSIS — M545 Low back pain, unspecified: Secondary | ICD-10-CM | POA: Diagnosis not present

## 2021-04-18 DIAGNOSIS — I482 Chronic atrial fibrillation, unspecified: Secondary | ICD-10-CM | POA: Diagnosis present

## 2021-04-18 DIAGNOSIS — N3000 Acute cystitis without hematuria: Secondary | ICD-10-CM | POA: Diagnosis not present

## 2021-04-18 DIAGNOSIS — R55 Syncope and collapse: Secondary | ICD-10-CM | POA: Diagnosis present

## 2021-04-18 DIAGNOSIS — Z8673 Personal history of transient ischemic attack (TIA), and cerebral infarction without residual deficits: Secondary | ICD-10-CM | POA: Diagnosis not present

## 2021-04-18 DIAGNOSIS — Z823 Family history of stroke: Secondary | ICD-10-CM | POA: Diagnosis not present

## 2021-04-18 DIAGNOSIS — M810 Age-related osteoporosis without current pathological fracture: Secondary | ICD-10-CM | POA: Diagnosis present

## 2021-04-18 DIAGNOSIS — Z853 Personal history of malignant neoplasm of breast: Secondary | ICD-10-CM | POA: Diagnosis not present

## 2021-04-18 DIAGNOSIS — E785 Hyperlipidemia, unspecified: Secondary | ICD-10-CM | POA: Diagnosis present

## 2021-04-18 DIAGNOSIS — E119 Type 2 diabetes mellitus without complications: Secondary | ICD-10-CM | POA: Diagnosis present

## 2021-04-18 DIAGNOSIS — M1990 Unspecified osteoarthritis, unspecified site: Secondary | ICD-10-CM | POA: Diagnosis present

## 2021-04-18 DIAGNOSIS — D320 Benign neoplasm of cerebral meninges: Secondary | ICD-10-CM | POA: Diagnosis present

## 2021-04-18 DIAGNOSIS — I5022 Chronic systolic (congestive) heart failure: Secondary | ICD-10-CM | POA: Diagnosis not present

## 2021-04-18 DIAGNOSIS — H9190 Unspecified hearing loss, unspecified ear: Secondary | ICD-10-CM | POA: Diagnosis present

## 2021-04-18 DIAGNOSIS — Z8249 Family history of ischemic heart disease and other diseases of the circulatory system: Secondary | ICD-10-CM | POA: Diagnosis not present

## 2021-04-18 DIAGNOSIS — E860 Dehydration: Secondary | ICD-10-CM | POA: Diagnosis present

## 2021-04-18 DIAGNOSIS — A419 Sepsis, unspecified organism: Secondary | ICD-10-CM | POA: Diagnosis present

## 2021-04-18 DIAGNOSIS — I5032 Chronic diastolic (congestive) heart failure: Secondary | ICD-10-CM | POA: Diagnosis present

## 2021-04-18 DIAGNOSIS — Z96652 Presence of left artificial knee joint: Secondary | ICD-10-CM | POA: Diagnosis present

## 2021-04-18 DIAGNOSIS — I110 Hypertensive heart disease with heart failure: Secondary | ICD-10-CM | POA: Diagnosis present

## 2021-04-18 DIAGNOSIS — N390 Urinary tract infection, site not specified: Secondary | ICD-10-CM | POA: Diagnosis present

## 2021-04-18 DIAGNOSIS — Z20822 Contact with and (suspected) exposure to covid-19: Secondary | ICD-10-CM | POA: Diagnosis present

## 2021-04-18 DIAGNOSIS — Z8041 Family history of malignant neoplasm of ovary: Secondary | ICD-10-CM | POA: Diagnosis not present

## 2021-04-18 DIAGNOSIS — F32A Depression, unspecified: Secondary | ICD-10-CM | POA: Diagnosis present

## 2021-04-18 DIAGNOSIS — G9341 Metabolic encephalopathy: Secondary | ICD-10-CM | POA: Diagnosis present

## 2021-04-18 DIAGNOSIS — M7918 Myalgia, other site: Secondary | ICD-10-CM

## 2021-04-18 LAB — CBC WITH DIFFERENTIAL/PLATELET
Abs Immature Granulocytes: 0.04 10*3/uL (ref 0.00–0.07)
Basophils Absolute: 0.1 10*3/uL (ref 0.0–0.1)
Basophils Relative: 1 %
Eosinophils Absolute: 0.3 10*3/uL (ref 0.0–0.5)
Eosinophils Relative: 3 %
HCT: 43.1 % (ref 36.0–46.0)
Hemoglobin: 14.5 g/dL (ref 12.0–15.0)
Immature Granulocytes: 1 %
Lymphocytes Relative: 22 %
Lymphs Abs: 1.9 10*3/uL (ref 0.7–4.0)
MCH: 33.3 pg (ref 26.0–34.0)
MCHC: 33.6 g/dL (ref 30.0–36.0)
MCV: 99.1 fL (ref 80.0–100.0)
Monocytes Absolute: 0.9 10*3/uL (ref 0.1–1.0)
Monocytes Relative: 10 %
Neutro Abs: 5.4 10*3/uL (ref 1.7–7.7)
Neutrophils Relative %: 63 %
Platelets: 217 10*3/uL (ref 150–400)
RBC: 4.35 MIL/uL (ref 3.87–5.11)
RDW: 12.6 % (ref 11.5–15.5)
WBC: 8.4 10*3/uL (ref 4.0–10.5)
nRBC: 0 % (ref 0.0–0.2)

## 2021-04-18 LAB — BASIC METABOLIC PANEL
Anion gap: 7 (ref 5–15)
BUN: 18 mg/dL (ref 8–23)
CO2: 24 mmol/L (ref 22–32)
Calcium: 9 mg/dL (ref 8.9–10.3)
Chloride: 104 mmol/L (ref 98–111)
Creatinine, Ser: 0.64 mg/dL (ref 0.44–1.00)
GFR, Estimated: >60 mL/min (ref >60)
Glucose, Bld: 129 mg/dL — ABNORMAL HIGH (ref 70–99)
Potassium: 4 mmol/L (ref 3.5–5.1)
Sodium: 135 mmol/L (ref 135–145)

## 2021-04-18 LAB — MAGNESIUM: Magnesium: 1.5 mg/dL — ABNORMAL LOW (ref 1.7–2.4)

## 2021-04-18 MED ORDER — TRAMADOL HCL 50 MG PO TABS: 50.0000 mg | ORAL_TABLET | Freq: Four times a day (QID) | ORAL | Status: DC | PRN

## 2021-04-18 MED ORDER — MAGNESIUM SULFATE 4 GM/100ML IV SOLN
4.0000 g | Freq: Once | INTRAVENOUS | Status: AC
  Administered 2021-04-18: 4 g via INTRAVENOUS
  Filled 2021-04-18: qty 100

## 2021-04-18 MED ORDER — TRAMADOL HCL 50 MG PO TABS
50.0000 mg | ORAL_TABLET | Freq: Three times a day (TID) | ORAL | Status: DC
Start: 2021-04-18 — End: 2021-04-20
  Administered 2021-04-18 – 2021-04-20 (×8): 50 mg via ORAL
  Filled 2021-04-18 (×8): qty 1

## 2021-04-18 NOTE — Progress Notes (Signed)
PROGRESS NOTE    Natalie Mendoza  BBC:[id number] DOB: 17-Oct-1932 DOA: 04/16/2021 PCP: Cari Caraway, MD   Chief Complaint  Patient presents with   Fall    Brief Narrative:  Patient is a 87 year old lady with history of chronic A. fib on Eliquis, hypertension, chronic diastolic CHF, hyperlipidemia presented to the ED with dizziness, confusion, passing out and a fall.  Patient with generalized weakness and fatigue the morning of admission went to the bathroom felt dizzy went back to the bedroom and passed out.  Unclear whether patient lost consciousness and how long she was unconscious for.  Patient noted to found herself on the floor and felt dizzy prior to passing out.  Patient noted on admission to have some mild confusion.  Patient noted to have recently been treated for UTI in the outpatient setting and finished a 5-day course of antibiotics.  Patient seen by PCP 2 days prior to admission with complaints of back discomfort and resumed back on oral antibiotics.  Patient presented to freestanding ED and noted to be septic secondary to UTI.  Patient pancultured.  Patient placed empirically on IV antibiotics.  Patient also noted to have borderline blood pressure of 101/64 on presentation.   Assessment & Plan:   Principal Problem:   Sepsis secondary to UTI [facility]) Active Problems:   HTN (hypertension)   Depression   Chronic atrial fibrillation (HCC)   Chronic systolic CHF (congestive heart failure) (HCC)   UTI (urinary tract infection)   Dehydration   Acute metabolic encephalopathy   Syncope: Questionable  #1 sepsis secondary to UTI, POA -Patient on admission met criteria for sepsis with leukocytosis, tachypnea with a respiratory rate of 26, urinalysis positive for pyuria, generalized fatigue, weakness and mild confusion. -Patient pancultured cultures pending with no growth to date. -Urine cultures negative however patient was on antibiotics for 7 days prior to admission. -Chest  x-ray negative for any acute infiltrate. -CT angiogram chest negative for PE and negative for any infiltrate. -Patient currently afebrile. -Leukocytosis is trended down. -Continue empiric IV Rocephin and likely transition to oral Omnicef/Vantin tomorrow if continued improvement.  -Supportive care.  2.??  Syncope/fall likely secondary to problem #1 superimposed on dehydration -On admission patient noted to have a soft blood pressure.  Urinalysis consistent with a UTI. -CT head negative for any acute abnormalities. -Patient pancultured with cultures pending with no growth to date. -Continue empiric IV Rocephin, supportive care. -PT/OT.  3.  Acute metabolic encephalopathy -Likely secondary to acute infection and dehydration. -Clinical improvement.   -Continue IV antibiotics, supportive care.   4.  Hypertension -Blood pressures noted to be soft on admission. -Blood pressure improved with IV fluids.   -Continue to hold home oral antihypertensive medications. -Supportive care.  5.  Chronic A. fib on Eliquis -Continue to hold bisoprolol as patient noted to have a slow ventricular rate on admission of 54.  Heart rate improving in the 60s to 70s. -Eliquis for anticoagulation.  6.  Depression/anxiety -Cymbalta.  7.  Chronic diastolic CHF -Euvolemic on examination.  8.  Hypomagnesemia -Magnesium at 1.5. -Magnesium sulfate 4 g IV x1. -Repeat labs in the morning.  9.  Musculoskeletal lower back pain -Patient with complaints of lower lumbar back pain.  Family concerned may be likely due to acute infection. -Patient with no CVA tenderness. -Patient afebrile, leukocytosis have improved on IV antibiotics. -Lumbar lower back region tender to palpation. -Place on scheduled Ultram. -Warm compresses.   DVT prophylaxis: Eliquis Code Status: Full Family Communication: Updated  patient.  Updated daughter at bedside. Disposition:   Status is: Inpatient  The patient will require care  spanning > 2 midnights and should be moved to inpatient because: Inpatient     Consultants:  None  Procedures:  CT angiogram chest 04/16/2021 CT head 04/16/2021 Chest x-ray 04/16/2021  Antimicrobials:  IV Rocephin 04/16/2021>>>>>   Subjective: Sitting up in recliner.  Complain of lower back pain when she gets up to try to ambulate.  Daughter at bedside.  No flank pain.  No chest pain.  No shortness of breath.  Complaining of pain around IV site.  Objective: Vitals:   04/17/21 1005 04/17/21 1815 04/17/21 2045 04/18/21 0659  BP: 133/73 136/87 131/88 (!) 147/101  Pulse: 69 60 66 67  Resp: 18 18 18 18   Temp: 98.5 F (36.9 C) (!) 97.4 F (36.3 C) 97.8 F (36.6 C) 98.4 F (36.9 C)  TempSrc:    Oral  SpO2: 96% 95% 95% 91%  Weight:      Height:        Intake/Output Summary (Last 24 hours) at 04/18/2021 1217 Last data filed at 04/17/2021 1600 Gross per 24 hour  Intake 331.31 ml  Output --  Net 331.31 ml    Filed Weights   04/16/21 1102  Weight: 66.7 kg    Examination:  General exam: : NAD Respiratory system: CTA B.  No wheezes, no rhonchi.  Speaking in full sentences.  Normal respiratory effort. Cardiovascular system: Regular rate and rhythm no murmurs rubs or gallops.  No JVD.  No lower extremity edema.  Gastrointestinal system: Abdomen soft, nontender, nondistended, positive bowel sounds.  No rebound.  No guarding.  No CVA tenderness. MSK: Lumbar region with tenderness to deep palpation. Central nervous system: Alert and oriented. No focal neurological deficits. Extremities: Symmetric 5 x 5 power. Skin: No rashes, lesions or ulcers Psychiatry: Judgement and insight appear normal. Mood & affect appropriate.   Data Reviewed: I have personally reviewed following labs and imaging studies  CBC: Recent Labs  Lab 04/16/21 1209 04/17/21 0426 04/18/21 0446  WBC 13.9* 7.5 8.4  NEUTROABS  --   --  5.4  HGB 15.5* 14.2 14.5  HCT 45.8 44.3 43.1  MCV 96.6 101.4*  99.1  PLT 226 209 217     Basic Metabolic Panel: Recent Labs  Lab 04/16/21 1209 04/17/21 0426 04/18/21 0446  NA 136 138 135  K 4.8 4.0 4.0  CL 99 106 104  CO2 28 25 24   GLUCOSE 128* 110* 129*  BUN 23 18 18   CREATININE 0.83 0.79 0.64  CALCIUM 9.4 8.9 9.0  MG  --  1.4* 1.5*  PHOS  --  4.0  --      GFR: Estimated Creatinine Clearance: 48.1 mL/min (by C-G formula based on SCr of 0.64 mg/dL).  Liver Function Tests: Recent Labs  Lab 04/16/21 1209  AST 23  ALT 19  ALKPHOS 36*  BILITOT 1.5*  PROT 6.8  ALBUMIN 3.9     CBG: No results for input(s): GLUCAP in the last 168 hours.   Recent Results (from the past 240 hour(s))  Culture, blood (single)     Status: None (Preliminary result)   Collection Time: 04/16/21 12:08 PM   Specimen: BLOOD RIGHT FOREARM  Result Value Ref Range Status   Specimen Description BLOOD RIGHT FOREARM  Final   Special Requests   Final    BOTTLES DRAWN AEROBIC AND ANAEROBIC Blood Culture adequate volume   Culture   Final  NO GROWTH 2 DAYS Performed at [facility], [place] [address], [place], [place] [address]    Report Status PENDING  Incomplete  Resp Panel by RT-PCR (Flu A&B, Covid)     Status: None   Collection Time: 04/16/21 12:09 PM   Specimen: Nasopharyngeal(NP) swabs in vial transport medium  Result Value Ref Range Status   SARS Coronavirus 2 by RT PCR NEGATIVE NEGATIVE Final    Comment: (NOTE) SARS-CoV-2 target nucleic acids are NOT DETECTED.  The SARS-CoV-2 RNA is generally detectable in upper respiratory specimens during the acute phase of infection. The lowest concentration of SARS-CoV-2 viral copies this assay can detect is 138 copies/mL. A negative result does not preclude SARS-Cov-2 infection and should not be used as the sole basis for treatment or other patient management decisions. A negative result may occur with  improper specimen collection/handling, submission of specimen other than nasopharyngeal swab,  presence of viral mutation(s) within the areas targeted by this assay, and inadequate number of viral copies(<138 copies/mL). A negative result must be combined with clinical observations, patient history, and epidemiological information. The expected result is Negative.  Fact Sheet for Patients:  EntrepreneurPulse.com.au  Fact Sheet for Healthcare Providers:  IncredibleEmployment.be  This test is no t yet approved or cleared by the [place] FDA and  has been authorized for detection and/or diagnosis of SARS-CoV-2 by FDA under an Emergency Use Authorization (EUA). This EUA will remain  in effect (meaning this test can be used) for the duration of the COVID-19 declaration under Section 564(b)(1) of the Act, 21 U.S.C.section 360bbb-3(b)(1), unless the authorization is terminated  or revoked sooner.       Influenza A by PCR NEGATIVE NEGATIVE Final   Influenza B by PCR NEGATIVE NEGATIVE Final    Comment: (NOTE) The Xpert Xpress SARS-CoV-2/FLU/RSV plus assay is intended as an aid in the diagnosis of influenza from Nasopharyngeal swab specimens and should not be used as a sole basis for treatment. Nasal washings and aspirates are unacceptable for Xpert Xpress SARS-CoV-2/FLU/RSV testing.  Fact Sheet for Patients: EntrepreneurPulse.com.au  Fact Sheet for Healthcare Providers: IncredibleEmployment.be  This test is not yet approved or cleared by the [place] FDA and has been authorized for detection and/or diagnosis of SARS-CoV-2 by FDA under an Emergency Use Authorization (EUA). This EUA will remain in effect (meaning this test can be used) for the duration of the COVID-19 declaration under Section 564(b)(1) of the Act, 21 U.S.C. section 360bbb-3(b)(1), unless the authorization is terminated or revoked.  Performed at [facility], [address] [place], [place] [address]    Culture, blood (single)     Status: None (Preliminary result)   Collection Time: 04/16/21  2:20 PM   Specimen: BLOOD LEFT FOREARM  Result Value Ref Range Status   Specimen Description   Final    BLOOD LEFT FOREARM Performed at [facility], [address], [place], [place] [address]    Special Requests   Final    BOTTLES DRAWN AEROBIC AND ANAEROBIC Blood Culture adequate volume   Culture   Final    NO GROWTH 2 DAYS Performed at [facility] [address], [place], [place] [address]    Report Status PENDING  Incomplete  Urine Culture     Status: None   Collection Time: 04/16/21  7:06 PM   Specimen: Urine, Clean Catch  Result Value Ref Range Status   Specimen Description   Final    URINE, CLEAN CATCH Performed  at [facility], [address] [place], [place] [address]    Special Requests   Final    NONE Performed at [facility], [address] [place], [place] [address]    Culture   Final    NO GROWTH Performed at [facility], [place] [address], [place], [place] [address]    Report Status 04/17/2021 FINAL  Final          Radiology Studies: CT Angio Chest PE W and/or Wo Contrast  Result Date: 04/16/2021 CLINICAL DATA:  Unresponsive EXAM: CT ANGIOGRAPHY CHEST WITH CONTRAST TECHNIQUE: Multidetector CT imaging of the chest was performed using the standard protocol during bolus administration of intravenous contrast. Multiplanar CT image reconstructions and MIPs were obtained to evaluate the vascular anatomy. CONTRAST:  73m OMNIPAQUE IOHEXOL 350 MG/ML SOLN COMPARISON:  Chest x-ray earlier the same day FINDINGS: Cardiovascular: No pulmonary embolism identified. Main pulmonary artery is normal caliber. Heart is enlarged with no pericardial effusion identified. Thoracic aorta is tortuous and normal caliber. Mediastinum/Nodes: A few mildly enlarged mediastinal lymph nodes measuring up to 10 mm in short axis  precarinal. No bulky hilar or axillary lymphadenopathy identified. Lungs/Pleura: No focal consolidation identified in the lungs. Mild subsegmental atelectatic changes bilaterally mostly in the lower lobes. Mild biapical pleural thickening. No pleural effusion or pneumothorax. Upper Abdomen: No acute process identified in the visualized upper abdomen. Colonic diverticulosis visualized. Musculoskeletal: Degenerative changes of the thoracic spine. No suspicious bony lesions identified. Review of the MIP images confirms the above findings. IMPRESSION: 1. No pulmonary embolism identified. 2. Mild subsegmental atelectatic changes in the lungs. 3. Cardiomegaly. 4. Colonic diverticulosis. Electronically Signed   By: DOfilia NeasM.D.   On: 04/16/2021 13:49        Scheduled Meds:  acetaminophen  500 mg Oral TID   apixaban  5 mg Oral BID   DULoxetine  30 mg Oral QHS   influenza vaccine adjuvanted  0.5 mL Intramuscular Tomorrow-1000   traMADol  50 mg Oral TID   Continuous Infusions:  cefTRIAXone (ROCEPHIN)  IV 2 g (04/18/21 0929)   magnesium sulfate bolus IVPB 4 g (04/18/21 1118)     LOS: 0 days    Time spent: 40 minutes    DIrine Seal MD [facility]   To contact the attending provider between 7A-7P or the covering provider during after hours 7P-7A, please log into the web site [email or web address] and access using universal [employer] password for that web site. If you do not have the password, please call the hospital operator.  04/18/2021, 12:17 PM

## 2021-04-19 DIAGNOSIS — I5022 Chronic systolic (congestive) heart failure: Secondary | ICD-10-CM | POA: Diagnosis not present

## 2021-04-19 DIAGNOSIS — A419 Sepsis, unspecified organism: Secondary | ICD-10-CM | POA: Diagnosis not present

## 2021-04-19 DIAGNOSIS — I482 Chronic atrial fibrillation, unspecified: Secondary | ICD-10-CM | POA: Diagnosis not present

## 2021-04-19 DIAGNOSIS — G9341 Metabolic encephalopathy: Secondary | ICD-10-CM | POA: Diagnosis not present

## 2021-04-19 LAB — CBC
HCT: 45.6 % (ref 36.0–46.0)
Hemoglobin: 15.3 g/dL — ABNORMAL HIGH (ref 12.0–15.0)
MCH: 32.8 pg (ref 26.0–34.0)
MCHC: 33.6 g/dL (ref 30.0–36.0)
MCV: 97.6 fL (ref 80.0–100.0)
Platelets: 206 10*3/uL (ref 150–400)
RBC: 4.67 MIL/uL (ref 3.87–5.11)
RDW: 12.4 % (ref 11.5–15.5)
WBC: 9.3 10*3/uL (ref 4.0–10.5)
nRBC: 0 % (ref 0.0–0.2)

## 2021-04-19 LAB — BASIC METABOLIC PANEL
Anion gap: 6 (ref 5–15)
BUN: 15 mg/dL (ref 8–23)
CO2: 30 mmol/L (ref 22–32)
Calcium: 9.4 mg/dL (ref 8.9–10.3)
Chloride: 100 mmol/L (ref 98–111)
Creatinine, Ser: 0.81 mg/dL (ref 0.44–1.00)
GFR, Estimated: >60 mL/min (ref >60)
Glucose, Bld: 125 mg/dL — ABNORMAL HIGH (ref 70–99)
Potassium: 4.5 mmol/L (ref 3.5–5.1)
Sodium: 136 mmol/L (ref 135–145)

## 2021-04-19 LAB — MAGNESIUM: Magnesium: 1.6 mg/dL — ABNORMAL LOW (ref 1.7–2.4)

## 2021-04-19 MED ORDER — BISOPROLOL FUMARATE 5 MG PO TABS
5.0000 mg | ORAL_TABLET | Freq: Every day | ORAL | Status: DC
  Administered 2021-04-19 – 2021-04-20 (×2): 5 mg via ORAL
  Filled 2021-04-19 (×2): qty 1

## 2021-04-19 MED ORDER — CEFDINIR 300 MG PO CAPS
300.0000 mg | ORAL_CAPSULE | Freq: Two times a day (BID) | ORAL | Status: DC
  Administered 2021-04-19 – 2021-04-20 (×3): 300 mg via ORAL
  Filled 2021-04-19 (×4): qty 1

## 2021-04-19 MED ORDER — VITAMIN D 25 MCG (1000 UNIT) PO TABS
2000.0000 [IU] | ORAL_TABLET | Freq: Every day | ORAL | Status: DC
  Administered 2021-04-20: 2000 [IU] via ORAL
  Filled 2021-04-19: qty 2

## 2021-04-19 MED ORDER — CHOLECALCIFEROL 25 MCG (1000 UT) PO TABS: 2000.0000 [IU] | ORAL_TABLET | Freq: Every day | ORAL | Status: DC

## 2021-04-19 MED ORDER — MAGNESIUM OXIDE -MG SUPPLEMENT 400 (240 MG) MG PO TABS: 400.0000 mg | ORAL_TABLET | Freq: Two times a day (BID) | ORAL | Status: DC

## 2021-04-19 MED ORDER — MAGNESIUM OXIDE -MG SUPPLEMENT 400 (240 MG) MG PO TABS
800.0000 mg | ORAL_TABLET | Freq: Two times a day (BID) | ORAL | Status: DC
  Administered 2021-04-19 – 2021-04-20 (×2): 800 mg via ORAL
  Filled 2021-04-19 (×2): qty 2

## 2021-04-19 MED ORDER — MAGNESIUM SULFATE 4 GM/100ML IV SOLN
4.0000 g | Freq: Once | INTRAVENOUS | Status: DC
  Administered 2021-04-19: 4 g via INTRAVENOUS
  Filled 2021-04-19: qty 100

## 2021-04-19 NOTE — Progress Notes (Signed)
PROGRESS NOTE    Natalie Mendoza  IZT:[id number] DOB: 1933-03-08 DOA: 04/16/2021 PCP: Cari Caraway, MD   Chief Complaint  Patient presents with   Fall    Brief Narrative:  Patient is a 87 year old lady with history of chronic A. fib on Eliquis, hypertension, chronic diastolic CHF, hyperlipidemia presented to the ED with dizziness, confusion, passing out and a fall.  Patient with generalized weakness and fatigue the morning of admission went to the bathroom felt dizzy went back to the bedroom and passed out.  Unclear whether patient lost consciousness and how long she was unconscious for.  Patient noted to found herself on the floor and felt dizzy prior to passing out.  Patient noted on admission to have some mild confusion.  Patient noted to have recently been treated for UTI in the outpatient setting and finished a 5-day course of antibiotics.  Patient seen by PCP 2 days prior to admission with complaints of back discomfort and resumed back on oral antibiotics.  Patient presented to freestanding ED and noted to be septic secondary to UTI.  Patient pancultured.  Patient placed empirically on IV antibiotics.  Patient also noted to have borderline blood pressure of 101/64 on presentation.   Assessment & Plan:   Principal Problem:   Sepsis secondary to UTI [facility]) Active Problems:   HTN (hypertension)   Depression   Chronic atrial fibrillation (HCC)   Chronic systolic CHF (congestive heart failure) (HCC)   UTI (urinary tract infection)   Dehydration   Acute metabolic encephalopathy   Syncope: Questionable  1 sepsis secondary to UTI, POA -Patient on admission met criteria for sepsis with leukocytosis, tachypnea with a respiratory rate of 26, urinalysis positive for pyuria, generalized fatigue, weakness and mild confusion. -Patient pancultured cultures pending with no growth to date. -Urine cultures negative however patient was on antibiotics for 7 days prior to admission. -Chest  x-ray negative for any acute infiltrate. -CT angiogram chest negative for PE and negative for any infiltrate. -Patient currently afebrile. -Leukocytosis is trended down. -We will transition from IV Rocephin to Omnicef to complete a course of antibiotic treatment. -Supportive care.   2.??  Syncope/fall likely secondary to problem #1 superimposed on dehydration -On admission patient noted to have a soft blood pressure.  Urinalysis consistent with a UTI. -CT head negative for any acute abnormalities. -Patient pancultured with cultures pending with no growth to date. -Continue empiric antibiotics, supportive care. -PT/OT.  3.  Acute metabolic encephalopathy -Likely secondary to acute infection and dehydration. -Clinical improvement.   -Continue empiric antibiotics, supportive care.   4.  Hypertension -Blood pressures noted to be soft on admission. -Blood pressure improved with IV fluids.   -Supportive care.  5.  Chronic A. fib on Eliquis -On admission patient's bisoprolol was initially held, as patient noted to have a slow ventricular rate on admission of 54.  Heart rate improving in the 70s to 90s. -Resume home regimen bisoprolol. -Eliquis for anticoagulation.  6.  Depression/anxiety -Continue Cymbalta.  7.  Chronic diastolic CHF -Euvolemic on examination.  8.  Hypomagnesemia -Magnesium at 1.6. -Magnesium sulfate 4 g IV x1 ordered however patient with complaints of burning the IV site and would like IV magnesium discontinued. -Place on oral magnesium sulfate 800 mg twice daily.. -Repeat labs in the morning. -Will need close outpatient monitoring with PCP in the outpatient setting.  9.  Musculoskeletal lower back pain -Patient with complaints of lower lumbar back pain.  Family concerned may be likely due to acute infection. -  Patient with no CVA tenderness. -Patient afebrile, leukocytosis have improved on IV antibiotics. -Lumbar lower back region less tender to palpation and  improved on current scheduled Ultram and K pad  -Supportive care.     DVT prophylaxis: Eliquis Code Status: Full Family Communication: Updated patient.  Updated daughter at bedside. Disposition:   Status is: Inpatient  The patient will require care spanning > 2 midnights and should be moved to inpatient because: Inpatient     Consultants:  None  Procedures:  CT angiogram chest 04/16/2021 CT head 04/16/2021 Chest x-ray 04/16/2021  Antimicrobials:  IV Rocephin 04/16/2021>>>>> 04/19/2021 Omnicef 04/19/2021>>>> 04/23/2021   Subjective: Patient sitting up in recliner.  Denies any chest pain.  No shortness of breath.  States IV site burning with magnesium infusion.  Lower back pain with some improvement with K pad and scheduled Ultram.  Daughter at bedside.  Overall slowly feeling better.    Objective: Vitals:   04/18/21 0659 04/18/21 1404 04/18/21 2038 04/19/21 0433  BP: (!) 147/101 (!) 136/91 120/89 (!) 142/87  Pulse: 67 78 78 78  Resp: 18 18 18 16   Temp: 98.4 F (36.9 C) 97.9 F (36.6 C) 98.9 F (37.2 C) 98.4 F (36.9 C)  TempSrc: Oral Oral Oral Oral  SpO2: 91% 96% 93% 92%  Weight:      Height:        Intake/Output Summary (Last 24 hours) at 04/19/2021 1249 Last data filed at 04/18/2021 1810 Gross per 24 hour  Intake 100 ml  Output --  Net 100 ml    Filed Weights   04/16/21 1102  Weight: 66.7 kg    Examination:  General exam: : NAD. Respiratory system: Lungs clear to auscultation bilaterally.  No wheezes, no crackles, no rhonchi.  Normal respiratory effort.  Speaking in full sentences.   Cardiovascular system: RRR no murmurs rubs or gallops.  No JVD.  No lower extremity edema.  Gastrointestinal system: Abdomen is soft, nontender, nondistended, positive bowel sounds.  No rebound.  No guarding.  No CVA tenderness. MSK: Lower lumbar region less tender to deep palpation. Central nervous system: Alert and oriented. No focal neurological  deficits. Extremities: Symmetric 5 x 5 power. Skin: No rashes, lesions or ulcers Psychiatry: Judgement and insight appear normal. Mood & affect appropriate.   Data Reviewed: I have personally reviewed following labs and imaging studies  CBC: Recent Labs  Lab 04/16/21 1209 04/17/21 0426 04/18/21 0446 04/19/21 0435  WBC 13.9* 7.5 8.4 9.3  NEUTROABS  --   --  5.4  --   HGB 15.5* 14.2 14.5 15.3*  HCT 45.8 44.3 43.1 45.6  MCV 96.6 101.4* 99.1 97.6  PLT 226 209 217 206     Basic Metabolic Panel: Recent Labs  Lab 04/16/21 1209 04/17/21 0426 04/18/21 0446 04/19/21 0435  NA 136 138 135 136  K 4.8 4.0 4.0 4.5  CL 99 106 104 100  CO2 28 25 24 30   GLUCOSE 128* 110* 129* 125*  BUN 23 18 18 15   CREATININE 0.83 0.79 0.64 0.81  CALCIUM 9.4 8.9 9.0 9.4  MG  --  1.4* 1.5* 1.6*  PHOS  --  4.0  --   --      GFR: Estimated Creatinine Clearance: 47.5 mL/min (by C-G formula based on SCr of 0.81 mg/dL).  Liver Function Tests: Recent Labs  Lab 04/16/21 1209  AST 23  ALT 19  ALKPHOS 36*  BILITOT 1.5*  PROT 6.8  ALBUMIN 3.9     CBG: No  results for input(s): GLUCAP in the last 168 hours.   Recent Results (from the past 240 hour(s))  Culture, blood (single)     Status: None (Preliminary result)   Collection Time: 04/16/21 12:08 PM   Specimen: BLOOD RIGHT FOREARM  Result Value Ref Range Status   Specimen Description BLOOD RIGHT FOREARM  Final   Special Requests   Final    BOTTLES DRAWN AEROBIC AND ANAEROBIC Blood Culture adequate volume   Culture   Final    NO GROWTH 3 DAYS Performed at [facility], [address] [place], [place] [address]    Report Status PENDING  Incomplete  Resp Panel by RT-PCR (Flu A&B, Covid)     Status: None   Collection Time: 04/16/21 12:09 PM   Specimen: Nasopharyngeal(NP) swabs in vial transport medium  Result Value Ref Range Status   SARS Coronavirus 2 by RT PCR NEGATIVE NEGATIVE Final    Comment: (NOTE) SARS-CoV-2 target  nucleic acids are NOT DETECTED.  The SARS-CoV-2 RNA is generally detectable in upper respiratory specimens during the acute phase of infection. The lowest concentration of SARS-CoV-2 viral copies this assay can detect is 138 copies/mL. A negative result does not preclude SARS-Cov-2 infection and should not be used as the sole basis for treatment or other patient management decisions. A negative result may occur with  improper specimen collection/handling, submission of specimen other than nasopharyngeal swab, presence of viral mutation(s) within the areas targeted by this assay, and inadequate number of viral copies(<138 copies/mL). A negative result must be combined with clinical observations, patient history, and epidemiological information. The expected result is Negative.  Fact Sheet for Patients:  EntrepreneurPulse.com.au  Fact Sheet for Healthcare Providers:  IncredibleEmployment.be  This test is no t yet approved or cleared by the [place] FDA and  has been authorized for detection and/or diagnosis of SARS-CoV-2 by FDA under an Emergency Use Authorization (EUA). This EUA will remain  in effect (meaning this test can be used) for the duration of the COVID-19 declaration under Section 564(b)(1) of the Act, 21 U.S.C.section 360bbb-3(b)(1), unless the authorization is terminated  or revoked sooner.       Influenza A by PCR NEGATIVE NEGATIVE Final   Influenza B by PCR NEGATIVE NEGATIVE Final    Comment: (NOTE) The Xpert Xpress SARS-CoV-2/FLU/RSV plus assay is intended as an aid in the diagnosis of influenza from Nasopharyngeal swab specimens and should not be used as a sole basis for treatment. Nasal washings and aspirates are unacceptable for Xpert Xpress SARS-CoV-2/FLU/RSV testing.  Fact Sheet for Patients: EntrepreneurPulse.com.au  Fact Sheet for Healthcare  Providers: IncredibleEmployment.be  This test is not yet approved or cleared by the [place] FDA and has been authorized for detection and/or diagnosis of SARS-CoV-2 by FDA under an Emergency Use Authorization (EUA). This EUA will remain in effect (meaning this test can be used) for the duration of the COVID-19 declaration under Section 564(b)(1) of the Act, 21 U.S.C. section 360bbb-3(b)(1), unless the authorization is terminated or revoked.  Performed at [facility], [address] [place], [place] [address]   Culture, blood (single)     Status: None (Preliminary result)   Collection Time: 04/16/21  2:20 PM   Specimen: BLOOD LEFT FOREARM  Result Value Ref Range Status   Specimen Description   Final    BLOOD LEFT FOREARM Performed at [facility], [address], [place], [place] [address]    Special Requests   Final  BOTTLES DRAWN AEROBIC AND ANAEROBIC Blood Culture adequate volume   Culture   Final    NO GROWTH 3 DAYS Performed at [facility], [place] [address] [place], [place] [address]    Report Status PENDING  Incomplete  Urine Culture     Status: None   Collection Time: 04/16/21  7:06 PM   Specimen: Urine, Clean Catch  Result Value Ref Range Status   Specimen Description   Final    URINE, CLEAN CATCH Performed at [facility], [address], [place], [place] [address]    Special Requests   Final    NONE Performed at [facility], [address] [place], [place] [address]    Culture   Final    NO GROWTH Performed at [facility], [place] [address], [place], [place] [address]    Report Status 04/17/2021 FINAL  Final          Radiology Studies: No results found.      Scheduled Meds:  apixaban  5 mg Oral BID   cefdinir  300 mg Oral Q12H   DULoxetine  30 mg Oral QHS   influenza vaccine adjuvanted  0.5 mL Intramuscular Tomorrow-1000    magnesium oxide  800 mg Oral BID   traMADol  50 mg Oral TID   Continuous Infusions:     LOS: 1 day    Time spent: 35 minutes    Natalie Seal, MD [facility]   To contact the attending provider between 7A-7P or the covering provider during after hours 7P-7A, please log into the web site [email or web address] and access using universal [place] password for that web site. If you do not have the password, please call the hospital operator.  04/19/2021, 12:49 PM

## 2021-04-19 NOTE — Progress Notes (Signed)
In to give PM medications.  Pts. Family says that lower back pain on the right has returned.  Toradol given this evening.

## 2021-04-19 NOTE — Progress Notes (Signed)
If D/C 10/31, family request 1 additional PT session prior to D/C.

## 2021-04-20 DIAGNOSIS — A419 Sepsis, unspecified organism: Secondary | ICD-10-CM | POA: Diagnosis not present

## 2021-04-20 DIAGNOSIS — I482 Chronic atrial fibrillation, unspecified: Secondary | ICD-10-CM | POA: Diagnosis not present

## 2021-04-20 DIAGNOSIS — N3000 Acute cystitis without hematuria: Secondary | ICD-10-CM | POA: Diagnosis not present

## 2021-04-20 DIAGNOSIS — G9341 Metabolic encephalopathy: Secondary | ICD-10-CM | POA: Diagnosis not present

## 2021-04-20 LAB — BASIC METABOLIC PANEL
Anion gap: 7 (ref 5–15)
BUN: 16 mg/dL (ref 8–23)
CO2: 27 mmol/L (ref 22–32)
Calcium: 9.1 mg/dL (ref 8.9–10.3)
Chloride: 97 mmol/L — ABNORMAL LOW (ref 98–111)
Creatinine, Ser: 0.68 mg/dL (ref 0.44–1.00)
GFR, Estimated: >60 mL/min (ref >60)
Glucose, Bld: 139 mg/dL — ABNORMAL HIGH (ref 70–99)
Potassium: 3.9 mmol/L (ref 3.5–5.1)
Sodium: 131 mmol/L — ABNORMAL LOW (ref 135–145)

## 2021-04-20 LAB — MAGNESIUM: Magnesium: 1.5 mg/dL — ABNORMAL LOW (ref 1.7–2.4)

## 2021-04-20 MED ORDER — MAGNESIUM OXIDE -MG SUPPLEMENT 400 (240 MG) MG PO TABS: 800.0000 mg | ORAL_TABLET | Freq: Two times a day (BID) | ORAL | 0 refills | Status: AC

## 2021-04-20 MED ORDER — SPIRONOLACTONE 25 MG PO TABS: 25.0000 mg | ORAL_TABLET | Freq: Every evening | ORAL | Status: DC

## 2021-04-20 MED ORDER — ACETAMINOPHEN 325 MG PO TABS: 650.0000 mg | ORAL_TABLET | Freq: Four times a day (QID) | ORAL | Status: DC | PRN

## 2021-04-20 MED ORDER — VALSARTAN 80 MG PO TABS: 80.0000 mg | ORAL_TABLET | Freq: Every evening | ORAL | Status: DC

## 2021-04-20 MED ORDER — TRAMADOL HCL 50 MG PO TABS: 50.0000 mg | ORAL_TABLET | Freq: Three times a day (TID) | ORAL | 0 refills | Status: DC | PRN

## 2021-04-20 MED ORDER — CEFDINIR 300 MG PO CAPS: 300.0000 mg | ORAL_CAPSULE | Freq: Two times a day (BID) | ORAL | 0 refills | Status: AC

## 2021-04-20 MED ORDER — DILTIAZEM HCL ER 60 MG PO CP12: 60.0000 mg | ORAL_CAPSULE | Freq: Every evening | ORAL | Status: AC

## 2021-04-20 NOTE — Progress Notes (Signed)
Provided and discussed discharge instructions. Addressed all questions and concerns. IV removed intact. PT recommended home health PT, messaged CM for set up.  Jerene Pitch

## 2021-04-20 NOTE — Discharge Summary (Signed)
Physician Discharge Summary  COPELAND LAPIER OEH:[id number] DOB: 1932/09/27 DOA: 04/16/2021  PCP: Cari Caraway, MD  Admit date: 04/16/2021 Discharge date: 04/20/2021  Time spent: 55 minutes  Recommendations for Outpatient Follow-up:  Follow-up with Cari Caraway, MD in 1 week.  On follow-up patient will need a basic metabolic profile, magnesium level checked to follow-up on electrolytes and renal function.  Blood cultures obtained during the hospitalization need to be followed up upon.  Patient's blood pressure need to be reassessed as patient's ARB, Cardizem, spironolactone were held to be resumed 1 week post discharge.   Discharge Diagnoses:  Principal Problem:   Sepsis secondary to UTI [facility]) Active Problems:   HTN (hypertension)   Depression   Chronic atrial fibrillation (HCC)   Chronic systolic CHF (congestive heart failure) (HCC)   UTI (urinary tract infection)   Dehydration   Acute metabolic encephalopathy   Syncope: Questionable   Discharge Condition: Stable and improved  Diet recommendation: Heart healthy  Filed Weights   04/16/21 1102  Weight: 66.7 kg    History of present illness:  HPI per Dr. Lenise Herald Natalie Mendoza is a 87 y.o. female with medical history significant for chronic atrial fibrillation on Eliquis, essential hypertension, chronic diastolic CHF, hyperlipidemia, who presented due to confusion, dizziness, passing out and fall.  Associated with generalized weakness and fatigue this morning.  She went to the bathroom, felt dizzy, walked back to her bedroom, and passed out.  Unclear if she lost consciousness or how long she was unconscious.  She stated she just found herself on the floor and felt dizzy prior to passing out.  At the time of this exam she appears to have mild confusion.  Patient was recently treated outpatient for urinary tract infection and finished a 5-day course of antibiotics.  She saw her primary care provider 2 days ago due to back  discomfort, she was restarted on her p.o. antibiotics.  She presented to [facility] ED for further evaluation.  Work-up in the ED revealed sepsis secondary to UTI.  Cultures were obtained and she was started on Rocephin empirically.  TRH, hospitalist team, was asked to admit.  Patient admitted as direct admit to [facility] long hospital progressive unit.   ED Course: Temperature 97.9.  BP 101/64, pulse 58, respiratory 17, saturation 92% on room air.  Lab studies remarkable for elevated T bilirubin 1.5, elevated lactic acid 2.0, WBC 13.9, hemoglobin 15.5, UA positive for pyuria.  COVID-19 screening test negative.  Hospital Course:  1 sepsis secondary to UTI, POA -Patient on admission met criteria for sepsis with leukocytosis, tachypnea with a respiratory rate of 26, urinalysis positive for pyuria, generalized fatigue, weakness and mild confusion. -Patient pancultured cultures pending with no growth to date. -Urine cultures negative however patient was on antibiotics for 7 days prior to admission. -Chest x-ray negative for any acute infiltrate. -CT angiogram chest negative for PE and negative for any infiltrate. -Patient remained afebrile.   -Leukocytosis trended down and had resolved by day of discharge.  -Patient initially placed on IV Rocephin and subsequently transition to oral Omnicef and patient was discharged on 3 more days of oral Omnicef to complete a course of antibiotic treatment.  2.??  Syncope/fall likely secondary to problem #1 superimposed on dehydration -On admission patient noted to have a soft blood pressure.  Urinalysis consistent with a UTI. -CT head negative for any acute abnormalities. -Patient pancultured with cultures pending with no growth to date. -Patient maintained on IV antibiotics, patient's antihypertensive medications were  all initially held and patient's bisoprolol subsequently resumed.   -Patient with no further syncopal episodes during the hospitalization.  -Patient  spironolactone, ARB and Cardizem will be resumed 1 week post discharge.   -Patient seen by PT/OT and no further PT recommendations made on discharge.   -Outpatient follow-up with PCP.   3.  Acute metabolic encephalopathy -Likely secondary to acute infection and dehydration. -Patient treated with empiric IV antibiotics.   -Patient improved clinically and was back to baseline by day of discharge.   4.  Hypertension -Blood pressures noted to be soft on admission. -Blood pressure improved with IV fluids.  -Patient's bisoprolol was resumed during the hospitalization. -Patient spironolactone, ARB, Cardizem were held during the hospitalization will be resumed 1 week postdischarge.  5.  Chronic A. fib on Eliquis -On admission patient's bisoprolol was initially held, as patient noted to have a slow ventricular rate on admission of 54.  Heart rate improved to the 70s to 90s. -Resumed home regimen bisoprolol. -Resume held and will be resumed 1 week post discharge. -Patient maintained on Eliquis for anticoagulation.  6.  Depression/anxiety -Patient maintained on home regimen Cymbalta.  7.  Chronic diastolic CHF -Euvolemic on examination. -Patient's beta-blocker was resumed during the hospitalization. -Due to soft blood pressure patient spironolactone, ARB, Cardizem were held during the hospitalization and will be resumed 1 week post discharge.  8.  Hypomagnesemia -Magnesium sulfate 4 g IV x1 ordered however patient with complaints of burning the IV site and would like IV magnesium discontinued. -Placed on oral magnesium sulfate 800 mg twice daily.. -Patient was discharged home on magnesium sulfate 800 mg twice daily x7 days.   -Outpatient follow-up with PCP.  9.  Musculoskeletal lower back pain -Patient with complaints of lower lumbar back pain.  Family concerned may be likely due to acute infection. -Patient with no CVA tenderness. -Patient afebrile, leukocytosis have improved on IV  antibiotics. -Lumbar lower back region less tender to palpation and improved on scheduled Ultram and K pad. -Patient improved clinically and be discharged home on Ultram as needed with close outpatient follow-up with PCP.    Procedures: CT angiogram chest 04/16/2021 CT head 04/16/2021 Chest x-ray 04/16/2021  Consultations: None  Discharge Exam: Vitals:   04/20/21 1344 04/20/21 1535  BP: (!) 86/63 102/84  Pulse: 74   Resp: 18   Temp: 97.6 F (36.4 C)   SpO2: 90%     General: NAD Cardiovascular: RRR no murmurs rubs or gallops.  No JVD.  No lower extremity edema Respiratory: Clear to auscultation bilaterally.  No wheezes, no crackles, no rhonchi.  Discharge Instructions   Discharge Instructions     Diet - low sodium heart healthy   Complete by: As directed    Increase activity slowly   Complete by: As directed       Allergies as of 04/20/2021   No Known Allergies      Medication List     STOP taking these medications    nitrofurantoin (macrocrystal-monohydrate) 100 MG capsule Commonly known as: MACROBID       TAKE these medications    acetaminophen 325 MG tablet Commonly known as: TYLENOL Take 2 tablets (650 mg total) by mouth every 6 (six) hours as needed for mild pain, fever or headache.   anastrozole 1 MG tablet Commonly known as: ARIMIDEX Take 1 mg by mouth daily.   apixaban 5 MG Tabs tablet Commonly known as: ELIQUIS Take 1 tablet (5 mg total) by mouth 2 (two) times daily.  bisoprolol 5 MG tablet Commonly known as: ZEBETA Take 5 mg by mouth daily.   cefdinir 300 MG capsule Commonly known as: OMNICEF Take 1 capsule (300 mg total) by mouth every 12 (twelve) hours for 3 days.   Cholecalciferol 25 MCG (1000 UT) tablet Take 2,000 Units by mouth daily.   diltiazem 60 MG 12 hr capsule Commonly known as: CARDIZEM SR Take 1 capsule (60 mg total) by mouth every evening. Start taking on: April 27, 2021 What changed:  when to take  this These instructions start on April 27, 2021. If you are unsure what to do until then, ask your doctor or other care provider.   DULoxetine 30 MG capsule Commonly known as: CYMBALTA Take 30 mg by mouth at bedtime.   erythromycin ophthalmic ointment Place 1 application into both eyes 3 (three) times daily.   estradiol 0.1 MG/GM vaginal cream Commonly known as: ESTRACE Place 1 Applicatorful vaginally 2 (two) times a week.   magnesium oxide 400 (240 Mg) MG tablet Commonly known as: MAG-OX Take 2 tablets (800 mg total) by mouth 2 (two) times daily for 7 days.   Prolia 60 MG/ML Sosy injection Generic drug: denosumab Inject 60 mg into the skin every 6 (six) months.   spironolactone 25 MG tablet Commonly known as: ALDACTONE Take 1 tablet (25 mg total) by mouth every evening. Start taking on: April 27, 2021 What changed: These instructions start on April 27, 2021. If you are unsure what to do until then, ask your doctor or other care provider.   traMADol 50 MG tablet Commonly known as: ULTRAM Take 1 tablet (50 mg total) by mouth 3 (three) times daily as needed.   valsartan 80 MG tablet Commonly known as: DIOVAN Take 1 tablet (80 mg total) by mouth every evening. Start taking on: April 27, 2021 What changed: These instructions start on April 27, 2021. If you are unsure what to do until then, ask your doctor or other care provider.       No Known Allergies  Follow-up Information     Cari Caraway, MD. Schedule an appointment as soon as possible for a visit in 1 week(s).   Specialty: Family Medicine Contact information: [place] [id number] [phone]                  The results of significant diagnostics from this hospitalization (including imaging, microbiology, ancillary and laboratory) are listed below for reference.    Significant Diagnostic Studies: CT HEAD WO CONTRAST  Result Date: 04/16/2021 CLINICAL DATA:  Head  trauma. EXAM: CT HEAD WITHOUT CONTRAST TECHNIQUE: Contiguous axial images were obtained from the base of the skull through the vertex without intravenous contrast. COMPARISON:  03/26/2021 FINDINGS: Brain: There is no evidence for acute hemorrhage, hydrocephalus, or abnormal extra-axial fluid collection. No definite CT evidence for acute infarction. Diffuse loss of parenchymal volume is consistent with atrophy. Patchy low attenuation in the deep hemispheric and periventricular white matter is nonspecific, but likely reflects chronic microvascular ischemic demyelination. 10 mm calcified left frontal meningioma is stable. Chronic posterior right frontal lobe infarct again noted. Old right occipital lobe infarct again noted. Vascular: No hyperdense vessel or unexpected calcification. Skull: No evidence for fracture. No worrisome lytic or sclerotic lesion. Sinuses/Orbits: The visualized paranasal sinuses and mastoid air cells are clear. Visualized portions of the globes and intraorbital fat are unremarkable. Other: None. IMPRESSION: 1. Stable.  No acute intracranial abnormality. 2. Atrophy with chronic small vessel white matter ischemic  disease. 3. Stable 10 mm calcified left frontal meningioma. 4. Old right occipital lobe infarct. Electronically Signed   By: Misty Stanley M.D.   On: 04/16/2021 11:37   CT Head Wo Contrast  Result Date: 03/26/2021 CLINICAL DATA:  Left-sided mono ocular vision loss EXAM: CT HEAD WITHOUT CONTRAST TECHNIQUE: Contiguous axial images were obtained from the base of the skull through the vertex without intravenous contrast. COMPARISON:  07/15/2020 FINDINGS: Brain: Normal anatomic configuration. Parenchymal volume loss is commensurate with the patient's age. Mild periventricular white matter changes are present likely reflecting the sequela of small vessel ischemia. Remote right occipital cortical infarct and right inferior frontal cortical infarct are unchanged. Stable 10 mm left frontal  meningioma along the frontal convexity abutting the precentral gyrus. No abnormal intra or extra-axial fluid collection. No abnormal mass effect or midline shift. No evidence of acute intracranial hemorrhage or infarct. Ventricular size is normal. Cerebellum unremarkable. Vascular: No asymmetric hyperdense vasculature at the skull base. Skull: Intact Sinuses/Orbits: Paranasal sinuses are clear. Orbits are unremarkable. Other: Mastoid air cells and middle ear cavities are clear. IMPRESSION: No acute intracranial hemorrhage or infarct. Stable remote right frontal and occipital cortical infarcts. Unchanged 10 mm left frontal meningioma. Stable senescent change. Electronically Signed   By: Fidela Salisbury M.D.   On: 03/26/2021 20:36   CT Angio Chest PE W and/or Wo Contrast  Result Date: 04/16/2021 CLINICAL DATA:  Unresponsive EXAM: CT ANGIOGRAPHY CHEST WITH CONTRAST TECHNIQUE: Multidetector CT imaging of the chest was performed using the standard protocol during bolus administration of intravenous contrast. Multiplanar CT image reconstructions and MIPs were obtained to evaluate the vascular anatomy. CONTRAST:  69m OMNIPAQUE IOHEXOL 350 MG/ML SOLN COMPARISON:  Chest x-ray earlier the same day FINDINGS: Cardiovascular: No pulmonary embolism identified. Main pulmonary artery is normal caliber. Heart is enlarged with no pericardial effusion identified. Thoracic aorta is tortuous and normal caliber. Mediastinum/Nodes: A few mildly enlarged mediastinal lymph nodes measuring up to 10 mm in short axis precarinal. No bulky hilar or axillary lymphadenopathy identified. Lungs/Pleura: No focal consolidation identified in the lungs. Mild subsegmental atelectatic changes bilaterally mostly in the lower lobes. Mild biapical pleural thickening. No pleural effusion or pneumothorax. Upper Abdomen: No acute process identified in the visualized upper abdomen. Colonic diverticulosis visualized. Musculoskeletal: Degenerative changes of  the thoracic spine. No suspicious bony lesions identified. Review of the MIP images confirms the above findings. IMPRESSION: 1. No pulmonary embolism identified. 2. Mild subsegmental atelectatic changes in the lungs. 3. Cardiomegaly. 4. Colonic diverticulosis. Electronically Signed   By: DOfilia NeasM.D.   On: 04/16/2021 13:49   DG Chest Port 1 View  Result Date: 04/16/2021 CLINICAL DATA:  Fall EXAM: PORTABLE CHEST 1 VIEW COMPARISON:  09/18/2017 FINDINGS: Transverse diameter of heart is increased. Mediastinum is unremarkable. There are no signs of pulmonary edema or new focal infiltrates. There is no significant pleural effusion or pneumothorax. Surgical clips are seen in right chest wall. IMPRESSION: Cardiomegaly. There are no signs of pulmonary edema or new focal infiltrates. [place] Electronically Signed   By: PElmer PickerM.D.   On: 04/16/2021 11:36    Microbiology: Recent Results (from the past 240 hour(s))  Culture, blood (single)     Status: None (Preliminary result)   Collection Time: 04/16/21 12:08 PM   Specimen: BLOOD RIGHT FOREARM  Result Value Ref Range Status   Specimen Description BLOOD RIGHT FOREARM  Final   Special Requests   Final    BOTTLES DRAWN AEROBIC  AND ANAEROBIC Blood Culture adequate volume   Culture   Final    NO GROWTH 4 DAYS Performed at [facility] [address] [place], [place] [address]    Report Status PENDING  Incomplete  Resp Panel by RT-PCR (Flu A&B, Covid)     Status: None   Collection Time: 04/16/21 12:09 PM   Specimen: Nasopharyngeal(NP) swabs in vial transport medium  Result Value Ref Range Status   SARS Coronavirus 2 by RT PCR NEGATIVE NEGATIVE Final    Comment: (NOTE) SARS-CoV-2 target nucleic acids are NOT DETECTED.  The SARS-CoV-2 RNA is generally detectable in upper respiratory specimens during the acute phase of infection. The lowest concentration of SARS-CoV-2 viral copies this assay can  detect is 138 copies/mL. A negative result does not preclude SARS-Cov-2 infection and should not be used as the sole basis for treatment or other patient management decisions. A negative result may occur with  improper specimen collection/handling, submission of specimen other than nasopharyngeal swab, presence of viral mutation(s) within the areas targeted by this assay, and inadequate number of viral copies(<138 copies/mL). A negative result must be combined with clinical observations, patient history, and epidemiological information. The expected result is Negative.  Fact Sheet for Patients:  EntrepreneurPulse.com.au  Fact Sheet for Healthcare Providers:  IncredibleEmployment.be  This test is no t yet approved or cleared by the [place] FDA and  has been authorized for detection and/or diagnosis of SARS-CoV-2 by FDA under an Emergency Use Authorization (EUA). This EUA will remain  in effect (meaning this test can be used) for the duration of the COVID-19 declaration under Section 564(b)(1) of the Act, 21 U.S.C.section 360bbb-3(b)(1), unless the authorization is terminated  or revoked sooner.       Influenza A by PCR NEGATIVE NEGATIVE Final   Influenza B by PCR NEGATIVE NEGATIVE Final    Comment: (NOTE) The Xpert Xpress SARS-CoV-2/FLU/RSV plus assay is intended as an aid in the diagnosis of influenza from Nasopharyngeal swab specimens and should not be used as a sole basis for treatment. Nasal washings and aspirates are unacceptable for Xpert Xpress SARS-CoV-2/FLU/RSV testing.  Fact Sheet for Patients: EntrepreneurPulse.com.au  Fact Sheet for Healthcare Providers: IncredibleEmployment.be  This test is not yet approved or cleared by the [place] FDA and has been authorized for detection and/or diagnosis of SARS-CoV-2 by FDA under an Emergency Use Authorization (EUA). This EUA will remain in  effect (meaning this test can be used) for the duration of the COVID-19 declaration under Section 564(b)(1) of the Act, 21 U.S.C. section 360bbb-3(b)(1), unless the authorization is terminated or revoked.  Performed at [facility], [address] [place], [place] [address]   Culture, blood (single)     Status: None (Preliminary result)   Collection Time: 04/16/21  2:20 PM   Specimen: BLOOD LEFT FOREARM  Result Value Ref Range Status   Specimen Description   Final    BLOOD LEFT FOREARM Performed at [facility], [address], [place], [place] [address]    Special Requests   Final    BOTTLES DRAWN AEROBIC AND ANAEROBIC Blood Culture adequate volume   Culture   Final    NO GROWTH 4 DAYS Performed at [facility] [address] [place],  [address]    Report Status PENDING  Incomplete  Urine Culture     Status: None   Collection Time: 04/16/21  7:06 PM   Specimen: Urine, Clean Catch  Result Value Ref Range  Status   Specimen Description   Final    URINE, CLEAN CATCH Performed at [facility], [address] [place], [place] [address]    Special Requests   Final    NONE Performed at [facility], [address], [place], [place] [address]    Culture   Final    NO GROWTH Performed at [facility], [employer] [employer] [address] [place], [place] [address]    Report Status 04/17/2021 FINAL  Final     Labs: Basic Metabolic Panel: Recent Labs  Lab 04/16/21 1209 04/17/21 0426 04/18/21 0446 04/19/21 0435 04/20/21 0449  NA 136 138 135 136 131*  K 4.8 4.0 4.0 4.5 3.9  CL 99 106 104 100 97*  CO2 _0 GLUCOSE 128* 110* 129* 125* 139*  BUN _1 CREATININE 0.83 0.79 0.64 0.81 0.68  CALCIUM 9.4 8.9 9.0 9.4 9.1  MG  --  1.4* 1.5* 1.6* 1.5*  PHOS  --  4.0  --   --   --    Liver Function Tests: Recent Labs  Lab 04/16/21 1209  AST 23  ALT 19  ALKPHOS 36*   BILITOT 1.5*  PROT 6.8  ALBUMIN 3.9   No results for input(s): LIPASE, AMYLASE in the last 168 hours. No results for input(s): AMMONIA in the last 168 hours. CBC: Recent Labs  Lab 04/16/21 1209 04/17/21 0426 04/18/21 0446 04/19/21 0435  WBC 13.9* 7.5 8.4 9.3  NEUTROABS  --   --  5.4  --   HGB 15.5* 14.2 14.5 15.3*  HCT 45.8 44.3 43.1 45.6  MCV 96.6 101.4* 99.1 97.6  PLT 226 209 217 206   Cardiac Enzymes: No results for input(s): CKTOTAL, CKMB, CKMBINDEX, TROPONINI in the last 168 hours. BNP: BNP (last 3 results) No results for input(s): BNP in the last 8760 hours.  ProBNP (last 3 results) No results for input(s): PROBNP in the last 8760 hours.  CBG: No results for input(s): GLUCAP in the last 168 hours.     Signed:  Irine Seal MD.  [facility] 04/20/2021, 3:36 PM

## 2021-04-20 NOTE — Progress Notes (Signed)
Physical Therapy Treatment Patient Details Name: Natalie Mendoza MRN: [id number] DOB: 16-Jun-1933 Today's Date: 04/20/2021   History of Present Illness 87yo female who presented on 10/27 with confusion, dizziness, syncope with fall. Found to have septic UTI at [facility] ED and transferred to [facility] long for further care. PMH Afib, breast CA, CHF, HOH, HTN, OA, osteoporosis, TIA, L TKR    PT Comments    General Comments: AxO x 2.5 very pleasant but easily distracted and required repeat VC's to complete 2 to 3 step command.  Cooperative.  Slighly anxious. General bed mobility comments: self able with increased time and use of rail but present with c/o back pain (RIGHT sciatic area)General transfer comment: pt self able with use of B UE's to steady self and slightly impulsive.General Gait Details: amb without AD as prior level but present with mild instability and need to steady self 50% of time.  Appears to be a "furniture walker" as she declines need for a walker.  Easily distracted and driffting right/left.  Increased c/o R back sciatic pain. Pt is a fall risk and would benefit from [facility] PT and initial 24/7 supervision at home.    Recommendations for follow up therapy are one component of a multi-disciplinary discharge planning process, led by the attending physician.  Recommendations may be updated based on patient status, additional functional criteria and insurance authorization.  Follow Up Recommendations  Home health PT     Assistance Recommended at Discharge    Equipment Recommendations       Recommendations for Other Services       Precautions / Restrictions Precautions Precautions: Fall Precaution Comments: present with early signs of cognitive impairements but very pleasant     Mobility  Bed Mobility Overal bed mobility: Needs Assistance Bed Mobility: Supine to Sit;Sit to Supine     Supine to sit: Supervision Sit to supine: Supervision   General bed mobility  comments: self able with increased time and use of rail but present with c/o back pain (RIGHT sciatic area)    Transfers Overall transfer level: Needs assistance Equipment used: None Transfers: Sit to/from [employer] Transfers Sit to Stand: Supervision Stand pivot transfers: Supervision;Min guard         General transfer comment: pt self able with use of B UE's to steady self and slightly impulsive.    Ambulation/Gait Ambulation/Gait assistance: Supervision Gait Distance (Feet): 175 Feet Assistive device: None Gait Pattern/deviations: Step-through pattern;WFL(Within Functional Limits);Drifts right/left Gait velocity: WNL   General Gait Details: amb without AD as prior level but present with mild instability and need to steady self 50% of time.  Appears to be a "furniture walker" as she declines need for a walker.  Easily distracted and driffting right/left.  Increased c/o R back sciatic pain.   Stairs             Wheelchair Mobility    Modified Rankin (Stroke Patients Only)       Balance                                            Cognition Arousal/Alertness: Awake/alert Behavior During Therapy: WFL for tasks assessed/performed Overall Cognitive Status: No family/caregiver present to determine baseline cognitive functioning  General Comments: AxO x 2.5 very pleasant but easily distracted and required repeat VC's to complete 2 to 3 step command.  Cooperative.  Slighly anxious.        Exercises      General Comments        Pertinent Vitals/Pain Pain Assessment: Faces Faces Pain Scale: Hurts little more Pain Location: back Pain Descriptors / Indicators: Discomfort;Grimacing;Sharp Pain Intervention(s): Monitored during session;Heat applied (K pad already in room)    Home Living                          Prior Function            PT Goals (current goals can now be  found in the care plan section) Progress towards PT goals: Progressing toward goals    Frequency    Min 2X/week      PT Plan Current plan remains appropriate    Co-evaluation              AM-PAC PT "6 Clicks" Mobility   Outcome Measure  Help needed turning from your back to your side while in a flat bed without using bedrails?: A Little Help needed moving from lying on your back to sitting on the side of a flat bed without using bedrails?: A Little Help needed moving to and from a bed to a chair (including a wheelchair)?: A Little Help needed standing up from a chair using your arms (e.g., wheelchair or bedside chair)?: A Little Help needed to walk in hospital room?: A Little Help needed climbing 3-5 steps with a railing? : A Little 6 Click Score: 18    End of Session Equipment Utilized During Treatment: Gait belt Activity Tolerance: Patient tolerated treatment well Patient left: with call bell/phone within reach;with chair alarm set;in bed Nurse Communication: Mobility status PT Visit Diagnosis: Muscle weakness (generalized) (M62.81);History of falling (Z91.81)     Time: 3437-3578 PT Time Calculation (min) (ACUTE ONLY): 18 min  Charges:  $Gait Training: 8-22 mins                     {Demetrica Zipp  PTA Acute  Rehabilitation [employer]      [phone] Office      [phone]

## 2021-04-20 NOTE — Plan of Care (Signed)
  Problem: Clinical Measurements: Goal: Respiratory complications will improve Outcome: Progressing Goal: Cardiovascular complication will be avoided Outcome: Progressing   Problem: Coping: Goal: Level of anxiety will decrease Outcome: Progressing   

## 2021-04-21 LAB — CULTURE, BLOOD (SINGLE)
Culture: NO GROWTH
Culture: NO GROWTH
Special Requests: ADEQUATE
Special Requests: ADEQUATE

## 2021-04-21 NOTE — Care Management (Signed)
-  after d/c note. Received message for HHPT ordered yesterday for set up today. Left vm w/dtr Lattie Haw, & son Shanon Brow for preference of [facility] agency-contacted [facility] rep Stacie for HHPT-able to accept.

## 2021-05-19 IMAGING — US US EXTREM LOW VENOUS*L*
1 series · 13 of 24 positions shown · non-contrast
Comparison: None.

CLINICAL DATA: Left lower extremity varicose veins, left leg pain,
anticoagulated

EXAM:
LEFT LOWER EXTREMITY VENOUS DOPPLER ULTRASOUND
TECHNIQUE: Gray-scale sonography with compression, as well as color and duplex
ultrasound, were performed to evaluate the deep venous system(s)
from the level of the common femoral vein through the popliteal and
proximal calf veins.

[Series 1: us extrem low venous*left* · 13 of 34 slices shown]
[im 1/34]
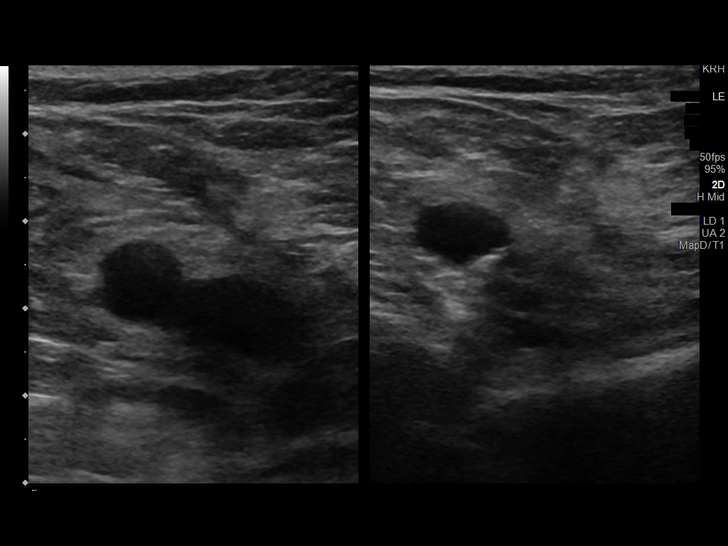
[im 3/34]
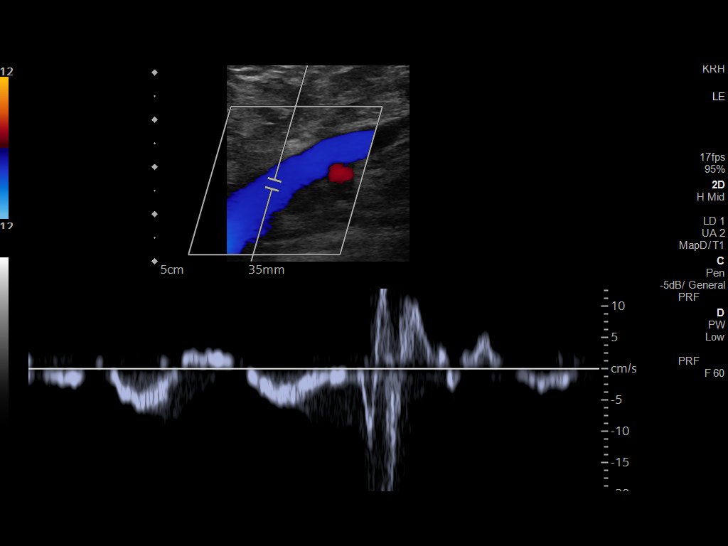
[im 6/34]
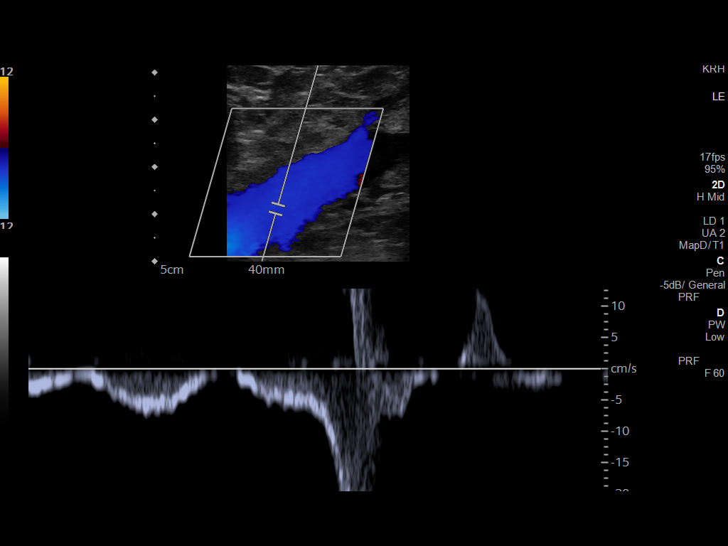
[im 9/34]
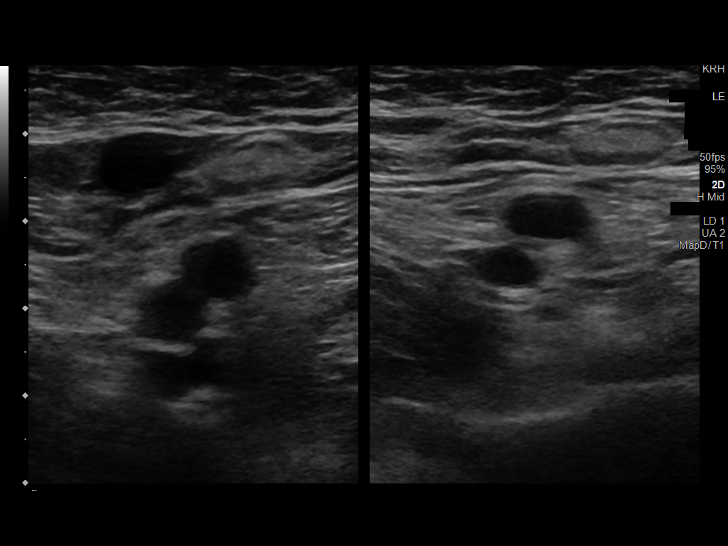
[im 12/34]
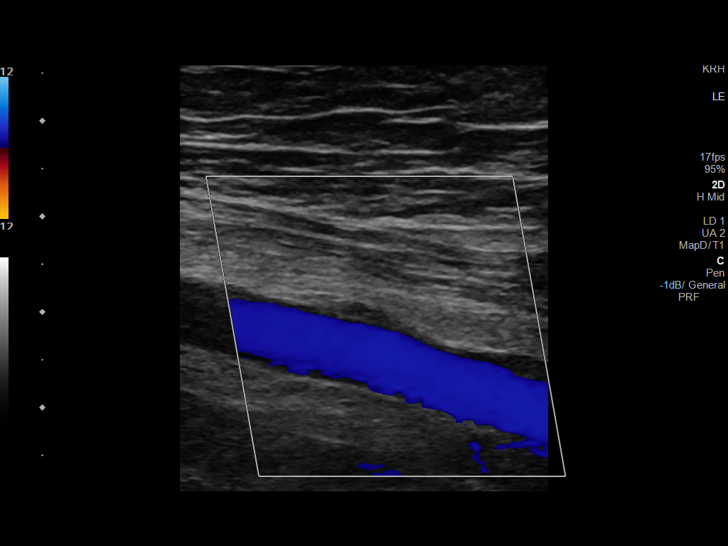
[im 15/34]
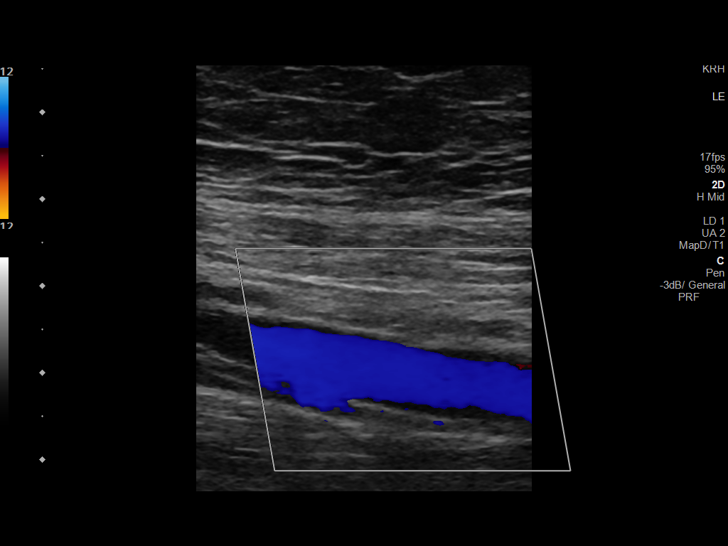
[im 18/34]
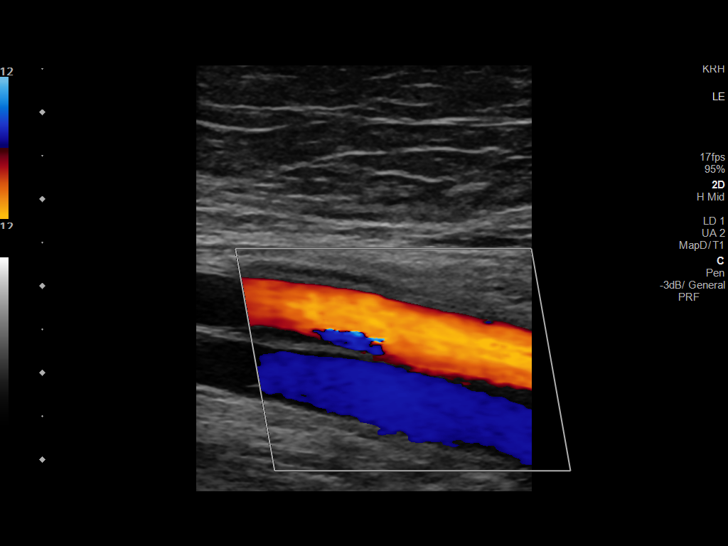
[im 19/34]
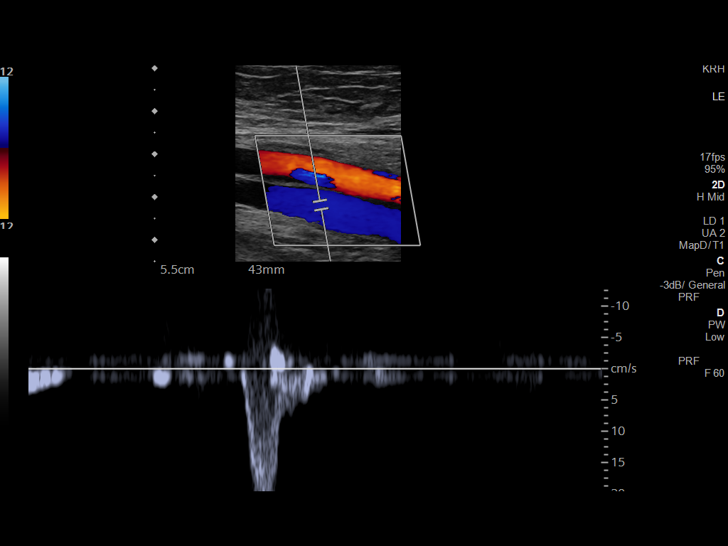
[im 22/34]
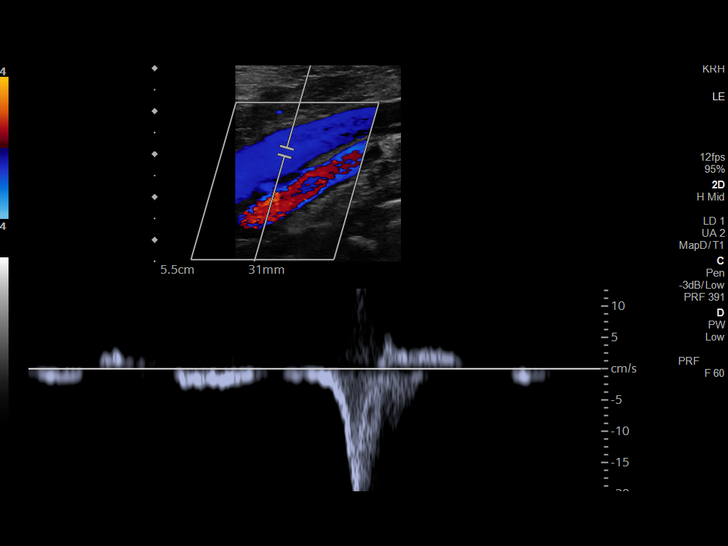
[im 25/34]
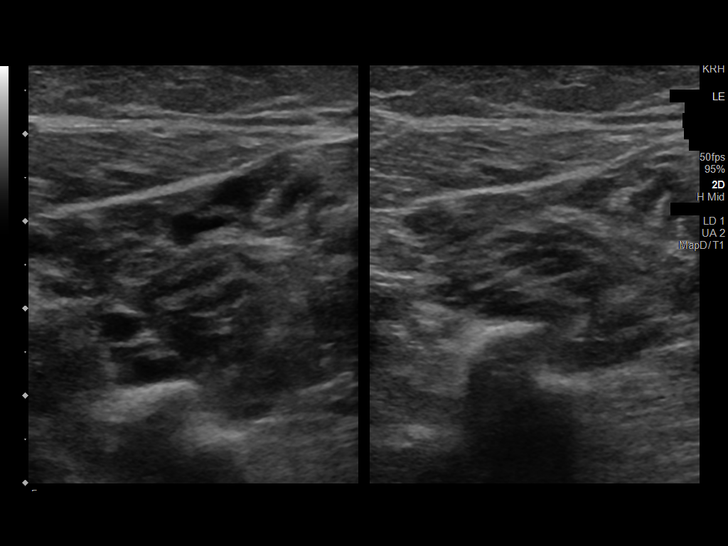
[im 28/34]
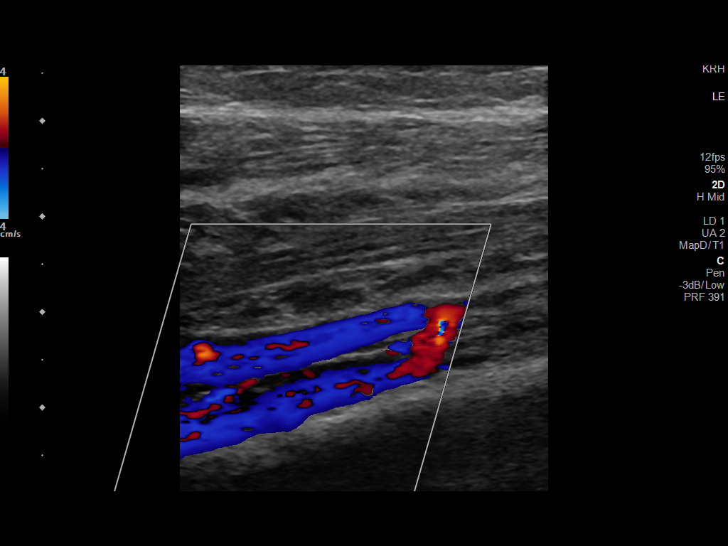
[im 31/34]
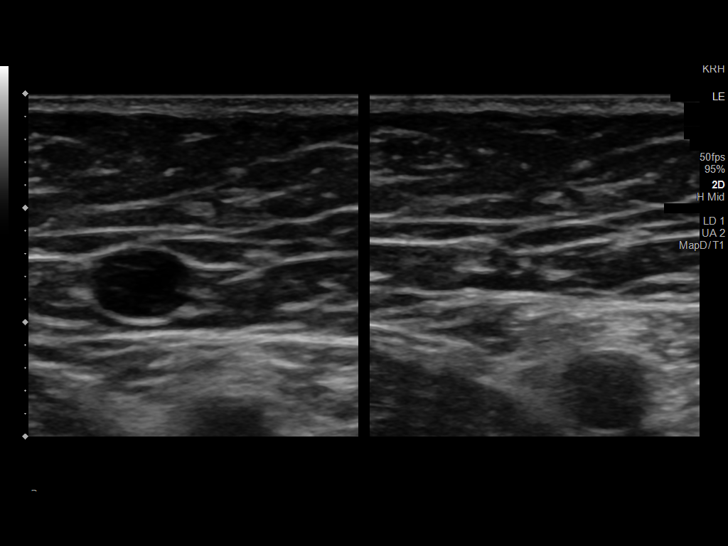
[im 34/34]
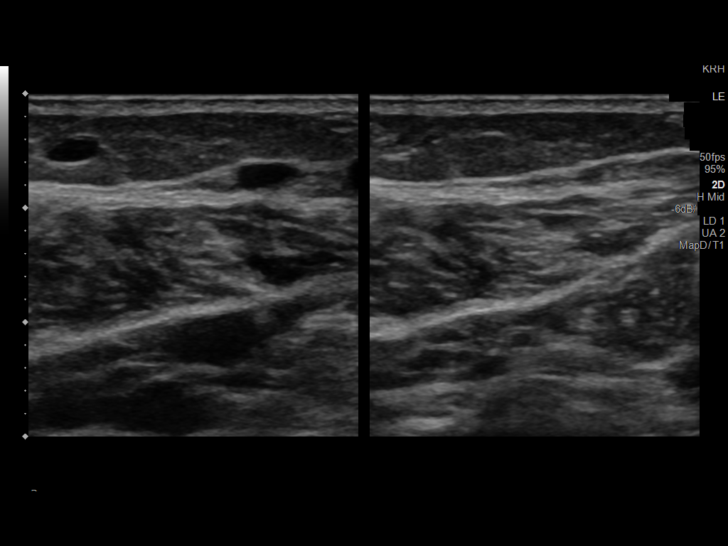

[13 of 24 positions shown; findings below may reference images not displayed]

FINDINGS: VENOUS

Normal compressibility of the common femoral, superficial femoral,
and popliteal veins, as well as the visualized calf veins.
Visualized portions of profunda femoral vein and great saphenous
vein unremarkable. No filling defects to suggest DVT on grayscale or
color Doppler imaging. Doppler waveforms show normal direction of
venous flow, normal respiratory phasicity and response to
augmentation.

Limited views of the contralateral common femoral vein are
unremarkable.

OTHER

Numerous varicosities within the left lower extremity are completely
compressible without superficial thrombophlebitis.

Limitations: none
IMPRESSION: No femoropopliteal DVT nor evidence of DVT within the visualized
calf veins.

If clinical symptoms are inconsistent or if there are persistent or
worsening symptoms, further imaging (possibly involving the iliac
veins) may be warranted.

## 2021-05-28 ENCOUNTER — Other Ambulatory Visit: Payer: Self-pay

## 2021-05-28 ENCOUNTER — Emergency Department (HOSPITAL_BASED_OUTPATIENT_CLINIC_OR_DEPARTMENT_OTHER): Payer: Medicare Other

## 2021-05-28 ENCOUNTER — Emergency Department (HOSPITAL_BASED_OUTPATIENT_CLINIC_OR_DEPARTMENT_OTHER): Payer: Medicare Other | Admitting: Radiology

## 2021-05-28 ENCOUNTER — Encounter (HOSPITAL_BASED_OUTPATIENT_CLINIC_OR_DEPARTMENT_OTHER): Payer: Self-pay | Admitting: *Deleted

## 2021-05-28 ENCOUNTER — Inpatient Hospital Stay (HOSPITAL_BASED_OUTPATIENT_CLINIC_OR_DEPARTMENT_OTHER)
Admission: EM | Admit: 2021-05-28 | Discharge: 2021-05-30 | DRG: 177 | Disposition: A | Discharge status: Home / Self Care | Payer: Medicare Other | Attending: Family Medicine | Admitting: Family Medicine

## 2021-05-28 DIAGNOSIS — Z7901 Long term (current) use of anticoagulants: Secondary | ICD-10-CM | POA: Diagnosis not present

## 2021-05-28 DIAGNOSIS — I130 Hypertensive heart and chronic kidney disease with heart failure and stage 1 through stage 4 chronic kidney disease, or unspecified chronic kidney disease: Secondary | ICD-10-CM | POA: Diagnosis present

## 2021-05-28 DIAGNOSIS — Z96652 Presence of left artificial knee joint: Secondary | ICD-10-CM | POA: Diagnosis present

## 2021-05-28 DIAGNOSIS — Z79899 Other long term (current) drug therapy: Secondary | ICD-10-CM

## 2021-05-28 DIAGNOSIS — N182 Chronic kidney disease, stage 2 (mild): Secondary | ICD-10-CM | POA: Diagnosis present

## 2021-05-28 DIAGNOSIS — Z853 Personal history of malignant neoplasm of breast: Secondary | ICD-10-CM | POA: Diagnosis not present

## 2021-05-28 DIAGNOSIS — Z79818 Long term (current) use of other agents affecting estrogen receptors and estrogen levels: Secondary | ICD-10-CM | POA: Diagnosis not present

## 2021-05-28 DIAGNOSIS — Z8673 Personal history of transient ischemic attack (TIA), and cerebral infarction without residual deficits: Secondary | ICD-10-CM | POA: Diagnosis not present

## 2021-05-28 DIAGNOSIS — R0602 Shortness of breath: Secondary | ICD-10-CM

## 2021-05-28 DIAGNOSIS — E1122 Type 2 diabetes mellitus with diabetic chronic kidney disease: Secondary | ICD-10-CM | POA: Diagnosis present

## 2021-05-28 DIAGNOSIS — Z823 Family history of stroke: Secondary | ICD-10-CM

## 2021-05-28 DIAGNOSIS — Z79811 Long term (current) use of aromatase inhibitors: Secondary | ICD-10-CM | POA: Diagnosis not present

## 2021-05-28 DIAGNOSIS — J9601 Acute respiratory failure with hypoxia: Secondary | ICD-10-CM | POA: Diagnosis present

## 2021-05-28 DIAGNOSIS — I482 Chronic atrial fibrillation, unspecified: Secondary | ICD-10-CM | POA: Diagnosis present

## 2021-05-28 DIAGNOSIS — M810 Age-related osteoporosis without current pathological fracture: Secondary | ICD-10-CM | POA: Diagnosis present

## 2021-05-28 DIAGNOSIS — Z86011 Personal history of benign neoplasm of the brain: Secondary | ICD-10-CM | POA: Diagnosis not present

## 2021-05-28 DIAGNOSIS — Z8249 Family history of ischemic heart disease and other diseases of the circulatory system: Secondary | ICD-10-CM | POA: Diagnosis not present

## 2021-05-28 DIAGNOSIS — U071 COVID-19: Principal | ICD-10-CM | POA: Diagnosis present

## 2021-05-28 DIAGNOSIS — J1282 Pneumonia due to coronavirus disease 2019: Secondary | ICD-10-CM

## 2021-05-28 DIAGNOSIS — H9190 Unspecified hearing loss, unspecified ear: Secondary | ICD-10-CM | POA: Diagnosis present

## 2021-05-28 DIAGNOSIS — E119 Type 2 diabetes mellitus without complications: Secondary | ICD-10-CM

## 2021-05-28 DIAGNOSIS — J189 Pneumonia, unspecified organism: Secondary | ICD-10-CM | POA: Diagnosis not present

## 2021-05-28 DIAGNOSIS — I10 Essential (primary) hypertension: Secondary | ICD-10-CM | POA: Diagnosis present

## 2021-05-28 DIAGNOSIS — F419 Anxiety disorder, unspecified: Secondary | ICD-10-CM | POA: Diagnosis present

## 2021-05-28 DIAGNOSIS — R0902 Hypoxemia: Secondary | ICD-10-CM

## 2021-05-28 DIAGNOSIS — I5022 Chronic systolic (congestive) heart failure: Secondary | ICD-10-CM | POA: Diagnosis present

## 2021-05-28 LAB — RESP PANEL BY RT-PCR (FLU A&B, COVID) ARPGX2
Influenza A by PCR: NEGATIVE
Influenza B by PCR: NEGATIVE
SARS Coronavirus 2 by RT PCR: POSITIVE — AB

## 2021-05-28 LAB — CBC
HCT: 44.6 % (ref 36.0–46.0)
Hemoglobin: 14.4 g/dL (ref 12.0–15.0)
MCH: 31.6 pg (ref 26.0–34.0)
MCHC: 32.3 g/dL (ref 30.0–36.0)
MCV: 98 fL (ref 80.0–100.0)
Platelets: 252 10*3/uL (ref 150–400)
RBC: 4.55 MIL/uL (ref 3.87–5.11)
RDW: 12.8 % (ref 11.5–15.5)
WBC: 6.4 10*3/uL (ref 4.0–10.5)
nRBC: 0 % (ref 0.0–0.2)

## 2021-05-28 LAB — HEMOGLOBIN A1C
Hgb A1c MFr Bld: 6.6 % — ABNORMAL HIGH (ref 4.8–5.6)
Mean Plasma Glucose: 142.72 mg/dL

## 2021-05-28 LAB — BASIC METABOLIC PANEL
Anion gap: 10 (ref 5–15)
BUN: 13 mg/dL (ref 8–23)
CO2: 30 mmol/L (ref 22–32)
Calcium: 9.9 mg/dL (ref 8.9–10.3)
Chloride: 93 mmol/L — ABNORMAL LOW (ref 98–111)
Creatinine, Ser: 0.95 mg/dL (ref 0.44–1.00)
GFR, Estimated: 59 mL/min — ABNORMAL LOW (ref >60)
Glucose, Bld: 138 mg/dL — ABNORMAL HIGH (ref 70–99)
Potassium: 4.7 mmol/L (ref 3.5–5.1)
Sodium: 133 mmol/L — ABNORMAL LOW (ref 135–145)

## 2021-05-28 LAB — TROPONIN I (HIGH SENSITIVITY)
Troponin I (High Sensitivity): 7 ng/L (ref <18)
Troponin I (High Sensitivity): 6 ng/L (ref <18)

## 2021-05-28 LAB — PROTIME-INR
INR: 1.3 — ABNORMAL HIGH (ref 0.8–1.2)
Prothrombin Time: 16.5 seconds — ABNORMAL HIGH (ref 11.4–15.2)

## 2021-05-28 LAB — PROCALCITONIN: Procalcitonin: <0.1 ng/mL

## 2021-05-28 MED ORDER — INSULIN ASPART 100 UNIT/ML IJ SOLN
0.0000 [IU] | Freq: Three times a day (TID) | INTRAMUSCULAR | Status: DC
  Administered 2021-05-29 (×2): 2 [IU] via SUBCUTANEOUS
  Administered 2021-05-29: 3 [IU] via SUBCUTANEOUS

## 2021-05-28 MED ORDER — ONDANSETRON HCL 4 MG PO TABS: 4.0000 mg | ORAL_TABLET | Freq: Four times a day (QID) | ORAL | Status: DC | PRN

## 2021-05-28 MED ORDER — DILTIAZEM HCL ER 60 MG PO CP12
60.0000 mg | ORAL_CAPSULE | Freq: Every day | ORAL | Status: DC
  Administered 2021-05-29 (×2): 60 mg via ORAL
  Filled 2021-05-28 (×3): qty 1

## 2021-05-28 MED ORDER — SPIRONOLACTONE 25 MG PO TABS
25.0000 mg | ORAL_TABLET | Freq: Every day | ORAL | Status: DC
  Administered 2021-05-29 (×2): 25 mg via ORAL
  Filled 2021-05-28 (×2): qty 1

## 2021-05-28 MED ORDER — DULOXETINE HCL 30 MG PO CPEP
30.0000 mg | ORAL_CAPSULE | Freq: Every day | ORAL | Status: DC
  Administered 2021-05-29 (×2): 30 mg via ORAL
  Filled 2021-05-28 (×2): qty 1

## 2021-05-28 MED ORDER — APIXABAN 5 MG PO TABS
5.0000 mg | ORAL_TABLET | Freq: Two times a day (BID) | ORAL | Status: DC
  Administered 2021-05-29 – 2021-05-30 (×4): 5 mg via ORAL
  Filled 2021-05-28 (×4): qty 1

## 2021-05-28 MED ORDER — INSULIN ASPART 100 UNIT/ML IJ SOLN
0.0000 [IU] | Freq: Every day | INTRAMUSCULAR | Status: DC
  Administered 2021-05-29: 4 [IU] via SUBCUTANEOUS

## 2021-05-28 MED ORDER — MELATONIN 5 MG PO TABS
5.0000 mg | ORAL_TABLET | Freq: Every day | ORAL | Status: DC
  Administered 2021-05-29 (×2): 5 mg via ORAL
  Filled 2021-05-28 (×2): qty 1

## 2021-05-28 MED ORDER — ONDANSETRON HCL 4 MG/2ML IJ SOLN: 4.0000 mg | Freq: Four times a day (QID) | INTRAMUSCULAR | Status: DC | PRN

## 2021-05-28 MED ORDER — IOHEXOL 350 MG/ML SOLN
80.0000 mL | Freq: Once | INTRAVENOUS | Status: AC | PRN
  Administered 2021-05-28: 80 mL via INTRAVENOUS

## 2021-05-28 MED ORDER — BISOPROLOL FUMARATE 5 MG PO TABS
5.0000 mg | ORAL_TABLET | Freq: Every day | ORAL | Status: DC
  Administered 2021-05-29 (×2): 5 mg via ORAL
  Filled 2021-05-28 (×2): qty 1

## 2021-05-28 MED ORDER — SODIUM CHLORIDE 0.9 % IV SOLN
200.0000 mg | Freq: Once | INTRAVENOUS | Status: AC
  Administered 2021-05-29: 200 mg via INTRAVENOUS
  Filled 2021-05-28: qty 40

## 2021-05-28 MED ORDER — ALBUTEROL SULFATE HFA 108 (90 BASE) MCG/ACT IN AERS
2.0000 | INHALATION_SPRAY | RESPIRATORY_TRACT | Status: DC | PRN
  Administered 2021-05-28: 2 via RESPIRATORY_TRACT
  Filled 2021-05-28 (×2): qty 6.7

## 2021-05-28 MED ORDER — IRBESARTAN 75 MG PO TABS
75.0000 mg | ORAL_TABLET | Freq: Every day | ORAL | Status: DC
  Administered 2021-05-29 (×2): 75 mg via ORAL
  Filled 2021-05-28 (×2): qty 1

## 2021-05-28 MED ORDER — AEROCHAMBER PLUS FLO-VU MISC
1.0000 | Freq: Once | Status: AC
  Administered 2021-05-28: 1
  Filled 2021-05-28: qty 1

## 2021-05-28 MED ORDER — ACETAMINOPHEN 325 MG PO TABS: 650.0000 mg | ORAL_TABLET | Freq: Four times a day (QID) | ORAL | Status: DC | PRN

## 2021-05-28 MED ORDER — SODIUM CHLORIDE 0.9 % IV SOLN
100.0000 mg | Freq: Every day | INTRAVENOUS | Status: DC
  Administered 2021-05-29: 100 mg via INTRAVENOUS
  Filled 2021-05-28 (×2): qty 20

## 2021-05-28 MED ORDER — DEXAMETHASONE 6 MG PO TABS
6.0000 mg | ORAL_TABLET | Freq: Every day | ORAL | Status: DC
  Administered 2021-05-29 – 2021-05-30 (×3): 6 mg via ORAL
  Filled 2021-05-28 (×3): qty 1

## 2021-05-28 MED ORDER — ACETAMINOPHEN 650 MG RE SUPP: 650.0000 mg | Freq: Four times a day (QID) | RECTAL | Status: DC | PRN

## 2021-05-28 NOTE — ED Provider Notes (Signed)
[facility] EMERGENCY DEPT Provider Note   CSN: [id number] Arrival date & time: 05/28/21  1617     History Chief Complaint  Patient presents with   Fever   Cough   Pneumonia    Natalie Mendoza is a 87 y.o. female.   Fever Associated symptoms: congestion, cough, headaches, myalgias, nausea, sore throat and vomiting   Associated symptoms: no chest pain and no dysuria   Cough Associated symptoms: fever, headaches, myalgias, shortness of breath and sore throat   Associated symptoms: no chest pain     Patient with history of CHF, atrial fibrillation on anticoagulation, hypertension presents due to cough x11 days.  It was initially productive, the last 48 hours it has not been.  She has been seen at urgent care and by her PCP for this, she has tried [employer] as well as Tussionex which have somewhat improved the cough.  2 days ago she was started on amoxicillin for suspected pneumonia, she has not improved.  Today she is satting at about 88% on room air, the daughter called the primary care doctor who advised to go to the ED for additional evaluation.  She does feel short of breath, denies any chest pain.  Past Medical History:  Diagnosis Date   Arrhythmia    Atrial Fib   Atrial fibrillation ([place])    Breast cancer (HCC)    CHF (congestive heart failure) (HCC)    Chronic systolic CHF (congestive heart failure) ([place])    Depression    Hearing impairment    Hx of joint problems    Hypertension    Osteoarthritis    Osteoporosis    Pelvic relaxation    Stroke ([place])    fully resolved TIA   Vaginal pessary present     Patient Active Problem List   Diagnosis Date Noted   Sepsis secondary to UTI ([place]) 04/17/2021   Dehydration 01/74/9449   Acute metabolic encephalopathy 67/59/1638   Syncope: Questionable 04/17/2021   UTI (urinary tract infection) 04/16/2021   Recurrent urinary tract infection 01/02/2020   Vaginal ulcer 01/02/2020   Chronic venous  insufficiency 10/05/2019   Type 2 diabetes mellitus without complication, without long-term current use of insulin ([place]) 08/15/2019   Vitamin D deficiency 07/24/2019   Status post left knee replacement 03/22/2019   Hypomagnesemia 03/09/2019   Acute postoperative pain 03/06/2019   SOB (shortness of breath) 12/15/2017   Hyperlipidemia    Sore throat    Chronic atrial fibrillation ([place]) 46/65/9935   Chronic systolic CHF (congestive heart failure) ([place]) 08/01/2016   Abdominal distension 11/23/2015   Nuclear sclerotic cataract of both eyes 03/29/2014   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/29/2014   Osteoporosis with fracture 08/28/2013   Left-sided weakness 03/13/2013   TIA (transient ischemic attack) 03/13/2013   HTN (hypertension) 03/13/2013   Dyslipidemia 03/13/2013   Depression 03/13/2013   Breast cancer ([place]) 09/01/2012   Deafness, sensorineural 07/31/2012   Hearing aid worn 07/31/2012   Other seborrheic keratosis 04/05/2011   Xerosis cutis 04/05/2011   Anxiety 03/17/2011   Sleep related leg cramps 12/06/2007   Major depressive disorder, single episode 12/03/2007   Arthropathia 11/28/2007   Age-related osteoporosis without current pathological fracture 11/28/2007   Asymptomatic varicose veins 11/28/2007   Pain in limb 11/28/2007   Swelling of limb 11/28/2007    Past Surgical History:  Procedure Laterality Date   BREAST LUMPECTOMY     CATARACT EXTRACTION W/ INTRAOCULAR LENS  IMPLANT, BILATERAL Bilateral    REPLACEMENT TOTAL KNEE  left     OB History     Gravida  2   Para  2   Term      Preterm      AB      Living         SAB      IAB      Ectopic      Multiple      Live Births              Family History  Problem Relation Age of Onset   Hypertension Mother    Stroke Mother    Ovarian cancer Maternal Grandmother     Social History   Tobacco Use   Smoking status: Never   Smokeless tobacco: Never  Vaping Use   Vaping Use: Never used   Substance Use Topics   Alcohol use: No   Drug use: No    Home Medications Prior to Admission medications   Medication Sig Start Date End Date Taking? Authorizing Provider  acetaminophen (TYLENOL) 325 MG tablet Take 2 tablets (650 mg total) by mouth every 6 (six) hours as needed for mild pain, fever or headache. 04/20/21   Eugenie Filler, MD  anastrozole (ARIMIDEX) 1 MG tablet Take 1 mg by mouth daily. Patient not taking: Reported on 04/16/2021    [provider]  apixaban (ELIQUIS) 5 MG TABS tablet Take 1 tablet (5 mg total) by mouth 2 (two) times daily. 03/15/13   Monika Salk, MD  bisoprolol (ZEBETA) 5 MG tablet Take 5 mg by mouth daily. 05/03/20   [provider]  Cholecalciferol 25 MCG (1000 UT) tablet Take 2,000 Units by mouth daily.    [provider]  denosumab (PROLIA) 60 MG/ML SOSY injection Inject 60 mg into the skin every 6 (six) months.    [provider]  diltiazem (CARDIZEM SR) 60 MG 12 hr capsule Take 1 capsule (60 mg total) by mouth every evening. 04/27/21   Eugenie Filler, MD  DULoxetine (CYMBALTA) 30 MG capsule Take 30 mg by mouth at bedtime.    [provider]  erythromycin ophthalmic ointment Place 1 application into both eyes 3 (three) times daily.    [provider]  estradiol (ESTRACE) 0.1 MG/GM vaginal cream Place 1 Applicatorful vaginally 2 (two) times a week.    [provider]  spironolactone (ALDACTONE) 25 MG tablet Take 1 tablet (25 mg total) by mouth every evening. 04/27/21   Eugenie Filler, MD  traMADol (ULTRAM) 50 MG tablet Take 1 tablet (50 mg total) by mouth 3 (three) times daily as needed. 04/20/21   Eugenie Filler, MD  valsartan (DIOVAN) 80 MG tablet Take 1 tablet (80 mg total) by mouth every evening. 04/27/21   Eugenie Filler, MD    Allergies    Patient has no known allergies.  Review of Systems   Review of Systems  Constitutional:  Positive for fatigue and fever.  HENT:   Positive for congestion and sore throat.   Respiratory:  Positive for cough and shortness of breath.   Cardiovascular:  Negative for chest pain.  Gastrointestinal:  Positive for nausea and vomiting. Negative for abdominal pain and anal bleeding.  Genitourinary:  Negative for dysuria.  Musculoskeletal:  Positive for myalgias.  Neurological:  Positive for headaches. Negative for syncope.   Physical Exam Updated Vital Signs BP 91/73 (BP Location: Right Arm)   Pulse 78   Temp 98.4 F (36.9 C)   Resp 18  Ht 5' 5.5" (1.664 m)   Wt 68 kg   SpO2 95%   BMI 24.58 kg/m   Physical Exam Vitals and nursing note reviewed. Exam conducted with a chaperone present.  Constitutional:      Appearance: Normal appearance. She is ill-appearing.     Comments: Patient is pale, ill-appearing but not toxic  HENT:     Head: Normocephalic and atraumatic.     Mouth/Throat:     Mouth: Mucous membranes are dry.  Eyes:     General: No scleral icterus.       Right eye: No discharge.        Left eye: No discharge.     Extraocular Movements: Extraocular movements intact.     Pupils: Pupils are equal, round, and reactive to light.  Cardiovascular:     Rate and Rhythm: Normal rate and regular rhythm.     Pulses: Normal pulses.     Heart sounds: Normal heart sounds. No murmur heard.   No friction rub. No gallop.  Pulmonary:     Effort: Pulmonary effort is normal. No respiratory distress.     Comments: Diminished lung sounds, no accessory muscle use or tachypnea Abdominal:     General: Abdomen is flat. Bowel sounds are normal. There is no distension.     Palpations: Abdomen is soft.     Tenderness: There is no abdominal tenderness.  Skin:    General: Skin is warm and dry.     Coloration: Skin is pale. Skin is not jaundiced.  Neurological:     Mental Status: She is alert. Mental status is at baseline.     Coordination: Coordination normal.    ED Results / Procedures / Treatments   Labs (all labs  ordered are listed, but only abnormal results are displayed) Labs Reviewed  RESP PANEL BY RT-PCR (FLU A&B, COVID) ARPGX2 - Abnormal; Notable for the following components:      Result Value   SARS Coronavirus 2 by RT PCR POSITIVE (*)    All other components within normal limits  BASIC METABOLIC PANEL - Abnormal; Notable for the following components:   Sodium 133 (*)    Chloride 93 (*)    Glucose, Bld 138 (*)    GFR, Estimated 59 (*)    All other components within normal limits  PROTIME-INR - Abnormal; Notable for the following components:   Prothrombin Time 16.5 (*)    INR 1.3 (*)    All other components within normal limits  CBC  TROPONIN I (HIGH SENSITIVITY)    EKG EKG Interpretation  Date/Time:  Thursday May 28 2021 16:50:53 EST Ventricular Rate:  74 PR Interval:    QRS Duration: 138 QT Interval:  388 QTC Calculation: 430 R Axis:   -80 Text Interpretation: Atrial fibrillation with a competing junctional pacemaker Left axis deviation Non-specific intra-ventricular conduction block Minimal voltage criteria for LVH, may be normal variant ( Cornell product ) Inferior infarct , age undetermined Abnormal ECG When compared to prior, similar appearance with faster rate. No STEMI Confirmed by Antony Blackbird [phone]) on 05/28/2021 5:12:48 PM  Radiology DG Chest 2 View  Result Date: 05/28/2021 CLINICAL DATA:  Shortness of breath EXAM: CHEST - 2 VIEW COMPARISON:  04/16/2021 FINDINGS: Redemonstrated cardiomegaly. Heterogeneous opacities in the right lateral lung base. No pleural effusion or pneumothorax. No other focal pulmonary opacity. No acute osseous abnormality. Surgical clips overlie the right hemithorax. IMPRESSION: Mild heterogeneous opacities in the right lateral lung base, which are nonspecific but could  be infectious, inflammatory, or atelectasis. Electronically Signed   By: Merilyn Baba M.D.   On: 05/28/2021 17:13    Procedures Procedures   Medications Ordered in  ED Medications  albuterol (VENTOLIN HFA) 108 (90 Base) MCG/ACT inhaler 2 puff (has no administration in time range)    ED Course  I have reviewed the triage vital signs and the nursing notes.  Pertinent labs & imaging results that were available during my care of the patient were reviewed by me and considered in my medical decision making (see chart for details).    MDM Rules/Calculators/A&P                           Patient is soft blood pressures, intermittent hypoxia.  She has COVID-positive, also with a right-sided pneumonia suspected to be bacterial on amoxicillin for 2 days without improvement.  She has been having hypoxia on the monitor while observed, x-ray does show evidence of what could be consistent with a right-sided infection.  Unclear if secondary to COVID or if a separate bacterial pneumonia.  She is already taking amoxicillin for 2 days without improvement.  Doubt PE, no tachycardia.  I do think patient needs admission for COVID with hypoxia.   Discussed HPI, physical exam and plan of care for this patient with attending Marda Stalker. The attending physician evaluated this patient as part of a shared visit and agrees with plan of care.   Spoke with admitting hospitalist who is agreeable with admission.  She does request ordering CTA to evaluate for PE given new hypoxia.  Also added procalcitonin, appreciate her consultation.  Final Clinical Impression(s) / ED Diagnoses Final diagnoses:  None    Rx / DC Orders ED Discharge Orders     None        Sherrill Raring, [place] 05/28/21 1928    Tegeler, Gwenyth Allegra, MD 05/29/21 2242

## 2021-05-28 NOTE — H&P (Signed)
History and Physical    Natalie Mendoza FMB:[id number] DOB: [birth year] DOA: 05/28/2021  PCP: Cari Caraway, MD   Patient coming from: Home  I have personally briefly reviewed patient's old medical records in [place]  CC: cough, hypoxia HPI: 87 year old female with a history of type 2 diabetes, chronic A. fib on Eliquis, hypertension, chronic systolic heart failure, chronic anxiety presents to the ER today as a transfer from [place].  Patient with approximately 10-day history of cough.  Intermittent fevers.  Patient seen at [facility] urgent care on 05/22/2021.  She tested negative for COVID, flu a and flu B.  Patient went home.  She has been having increasing cough.  Daughters been checking her oxygen saturations.  Today was less than 88% at home.  She was brought to the ER.  She was tested positive for COVID.  CTPA demonstrated COVID-pneumonia.  She was hypoxic.  Required supplemental oxygen.  Patient transferred to [facility] for further care.  Patient refused to give me a history or review of systems.  She deferred all HPI to her daughter.   ED Course: covid positive, CTPA negative for pe. RA sats 88% in office.  Review of Systems:  Review of Systems  Unable to perform ROS: Other  Pt refusing to answer questions  Past Medical History:  Diagnosis Date   Arrhythmia    Atrial Fib   Atrial fibrillation ([place])    Breast cancer (HCC)    CHF (congestive heart failure) (HCC)    Chronic systolic CHF (congestive heart failure) ([place])    Depression    Hearing impairment    Hx of joint problems    Hypertension    Osteoarthritis    Osteoporosis    Pelvic relaxation    Stroke ([place])    fully resolved TIA   Vaginal pessary present     Past Surgical History:  Procedure Laterality Date   BREAST LUMPECTOMY     CATARACT EXTRACTION W/ INTRAOCULAR LENS  IMPLANT, BILATERAL Bilateral    REPLACEMENT TOTAL KNEE     left     reports that she has never smoked. She has never used  smokeless tobacco. She reports that she does not drink alcohol and does not use drugs.  No Known Allergies  Family History  Problem Relation Age of Onset   Hypertension Mother    Stroke Mother    Ovarian cancer Maternal Grandmother     Prior to Admission medications   Medication Sig Start Date End Date Taking? Authorizing Provider  acetaminophen (TYLENOL) 325 MG tablet Take 2 tablets (650 mg total) by mouth every 6 (six) hours as needed for mild pain, fever or headache. 04/20/21   Eugenie Filler, MD  anastrozole (ARIMIDEX) 1 MG tablet Take 1 mg by mouth daily. Patient not taking: Reported on 04/16/2021    [provider]  apixaban (ELIQUIS) 5 MG TABS tablet Take 1 tablet (5 mg total) by mouth 2 (two) times daily. 03/15/13   Monika Salk, MD  bisoprolol (ZEBETA) 5 MG tablet Take 5 mg by mouth daily. 05/03/20   [provider]  Cholecalciferol 25 MCG (1000 UT) tablet Take 2,000 Units by mouth daily.    [provider]  denosumab (PROLIA) 60 MG/ML SOSY injection Inject 60 mg into the skin every 6 (six) months.    [provider]  diltiazem (CARDIZEM SR) 60 MG 12 hr capsule Take 1 capsule (60 mg total) by mouth every evening. 04/27/21   Eugenie Filler,  MD  DULoxetine (CYMBALTA) 30 MG capsule Take 30 mg by mouth at bedtime.    [provider]  erythromycin ophthalmic ointment Place 1 application into both eyes 3 (three) times daily.    [provider]  estradiol (ESTRACE) 0.1 MG/GM vaginal cream Place 1 Applicatorful vaginally 2 (two) times a week.    [provider]  spironolactone (ALDACTONE) 25 MG tablet Take 1 tablet (25 mg total) by mouth every evening. 04/27/21   Eugenie Filler, MD  traMADol (ULTRAM) 50 MG tablet Take 1 tablet (50 mg total) by mouth 3 (three) times daily as needed. 04/20/21   Eugenie Filler, MD  valsartan (DIOVAN) 80 MG tablet Take 1 tablet (80 mg total) by mouth every evening. 04/27/21   Eugenie Filler, MD    Physical Exam: Vitals:   05/28/21 1900 05/28/21 2000 05/28/21 2202 05/28/21 2203  BP: 107/79 128/71  104/75  Pulse: 88 80  83  Resp: 19 18  18   Temp:    98.4 F (36.9 C)  TempSrc:    Oral  SpO2: 97% 97%  98%  Weight:   67.6 kg   Height:   5\' 6"  (1.676 m)     Physical Exam Vitals and nursing note reviewed.  Constitutional:      General: She is not in acute distress.    Appearance: Normal appearance. She is not ill-appearing, toxic-appearing or diaphoretic.  HENT:     Head: Normocephalic and atraumatic.     Nose: Nose normal. No rhinorrhea.  Eyes:     General: No scleral icterus.       Right eye: No discharge.        Left eye: No discharge.  Cardiovascular:     Rate and Rhythm: Normal rate. Rhythm irregular.     Pulses: Normal pulses.  Pulmonary:     Breath sounds: Examination of the right-lower field reveals rales. Examination of the left-lower field reveals rales. Rales present. No wheezing or rhonchi.  Abdominal:     General: Bowel sounds are normal. There is no distension.     Palpations: Abdomen is soft.     Tenderness: There is no abdominal tenderness.     Hernia: No hernia is present.  Musculoskeletal:     Right lower leg: No edema.     Left lower leg: No edema.  Skin:    General: Skin is warm and dry.  Neurological:     General: No focal deficit present.     Mental Status: She is oriented to person, place, and time.  Psychiatric:        Mood and Affect: Mood is anxious.        Behavior: Behavior is agitated.     Labs on Admission: I have personally reviewed following labs and imaging studies  CBC: Recent Labs  Lab 05/28/21 1655  WBC 6.4  HGB 14.4  HCT 44.6  MCV 98.0  PLT 527   Basic Metabolic Panel: Recent Labs  Lab 05/28/21 1655  NA 133*  K 4.7  CL 93*  CO2 30  GLUCOSE 138*  BUN 13  CREATININE 0.95  CALCIUM 9.9   GFR: Estimated Creatinine Clearance: 40.5 mL/min (by C-G formula based on SCr of 0.95 mg/dL). Liver  Function Tests: No results for input(s): AST, ALT, ALKPHOS, BILITOT, PROT, ALBUMIN in the last 168 hours. No results for input(s): LIPASE, AMYLASE in the last 168 hours. No results for input(s): AMMONIA in the last 168 hours. Coagulation Profile: Recent  Labs  Lab 05/28/21 1655  INR 1.3*   Cardiac Enzymes: No results for input(s): CKTOTAL, CKMB, CKMBINDEX, TROPONINI in the last 168 hours. BNP (last 3 results) No results for input(s): PROBNP in the last 8760 hours. HbA1C: No results for input(s): HGBA1C in the last 72 hours. CBG: No results for input(s): GLUCAP in the last 168 hours. Lipid Profile: No results for input(s): CHOL, HDL, LDLCALC, TRIG, CHOLHDL, LDLDIRECT in the last 72 hours. Thyroid Function Tests: No results for input(s): TSH, T4TOTAL, FREET4, T3FREE, THYROIDAB in the last 72 hours. Anemia Panel: No results for input(s): VITAMINB12, FOLATE, FERRITIN, TIBC, IRON, RETICCTPCT in the last 72 hours. Urine analysis:    Component Value Date/Time   COLORURINE YELLOW 04/16/2021 1208   APPEARANCEUR HAZY (A) 04/16/2021 1208   LABSPEC 1.018 04/16/2021 1208   PHURINE 8.0 04/16/2021 1208   GLUCOSEU NEGATIVE 04/16/2021 1208   HGBUR NEGATIVE 04/16/2021 [place] 04/16/2021 1208   BILIRUBINUR negative 09/01/2012 1506   KETONESUR NEGATIVE 04/16/2021 1208   PROTEINUR TRACE (A) 04/16/2021 1208   UROBILINOGEN 0.2 09/27/2014 1145   NITRITE NEGATIVE 04/16/2021 1208   LEUKOCYTESUR LARGE (A) 04/16/2021 1208    Radiological Exams on Admission: I have personally reviewed images DG Chest 2 View  Result Date: 05/28/2021 CLINICAL DATA:  Shortness of breath EXAM: CHEST - 2 VIEW COMPARISON:  04/16/2021 FINDINGS: Redemonstrated cardiomegaly. Heterogeneous opacities in the right lateral lung base. No pleural effusion or pneumothorax. No other focal pulmonary opacity. No acute osseous abnormality. Surgical clips overlie the right hemithorax. IMPRESSION: Mild heterogeneous  opacities in the right lateral lung base, which are nonspecific but could be infectious, inflammatory, or atelectasis. Electronically Signed   By: Merilyn Baba M.D.   On: 05/28/2021 17:13   CT Angio Chest PE W/Cm &/Or Wo Cm  Result Date: 05/28/2021 CLINICAL DATA:  Shortness of breath and fevers with cough and history of COVID-19 positivity, initial encounter EXAM: CT ANGIOGRAPHY CHEST WITH CONTRAST TECHNIQUE: Multidetector CT imaging of the chest was performed using the standard protocol during bolus administration of intravenous contrast. Multiplanar CT image reconstructions and MIPs were obtained to evaluate the vascular anatomy. CONTRAST:  6mL OMNIPAQUE IOHEXOL 350 MG/ML SOLN COMPARISON:  Chest x-ray from earlier in the same day. FINDINGS: Cardiovascular: Atherosclerotic calcifications of the thoracic aorta are noted without aneurysmal dilatation. Heart is at the upper limits of normal in size. The pulmonary artery is well visualized within normal enhancement pattern. No filling defect to suggest pulmonary embolism is identified. Mediastinum/Nodes: Thoracic inlet is within normal limits. No sizable hilar or mediastinal adenopathy is noted. Some small reactive lymph nodes are seen within the hila and mediastinum. The esophagus as visualized is within normal limits. Lungs/Pleura: Lungs are well aerated bilaterally. Patchy atelectatic changes are seen bilaterally. No focal infiltrate or sizable effusion is seen. Upper Abdomen: No acute abnormality. Musculoskeletal: No acute rib abnormality is noted. Degenerative changes of the thoracic spine are seen. Review of the MIP images confirms the above findings. IMPRESSION: No evidence of pulmonary emboli. Patchy atelectatic changes bilaterally. These correspond to that seen on recent plain film examination. Aortic Atherosclerosis (ICD10-I70.0). Electronically Signed   By: Inez Catalina M.D.   On: 05/28/2021 19:47    EKG: I have personally reviewed EKG:  afib   Assessment/Plan Principal Problem:   Pneumonia due to COVID-19 virus Active Problems:   Acute hypoxemic respiratory failure due to COVID-19 [facility])   HTN (hypertension)   Anxiety   Chronic atrial fibrillation (HCC)  Chronic systolic CHF (congestive heart failure) (HCC)   Type 2 diabetes mellitus without complication, without long-term current use of insulin (HCC)    Pneumonia due to COVID-19 virus Admit to telemetry bed. IV remdesivir. Po decadron. Follow inflammatory markers. procalcitonin negative. Doubt superimposed pneumonia. No abx for now. Discussed with pt's dtr lisa at bedside.  Acute hypoxemic respiratory failure due to COVID-19 ([place]) Continue supplemental O2  HTN (hypertension) Continue home meds.  Anxiety Continue cymbalta  Chronic atrial fibrillation ([place]) Continue zebeta, cardizem and eliquis.  Chronic systolic CHF (congestive heart failure) (HCC) Stable. Continue zebeta, aldcatone, ARB.  Type 2 diabetes mellitus without complication, without long-term current use of insulin (HCC) Add SSI.  DVT prophylaxis: Eliquis Code Status: Full Code Family Communication: discussed with pt dtr lisa at bedside  Disposition Plan: return home  Consults called: none  Admission status: Inpatient, Med-Surg   Kristopher Oppenheim, DO [facility] 05/28/2021, 10:54 PM

## 2021-05-28 NOTE — Assessment & Plan Note (Signed)
-   Continue supplemental O2 

## 2021-05-28 NOTE — Assessment & Plan Note (Signed)
Add SSI. ?

## 2021-05-28 NOTE — ED Notes (Signed)
Called [facility] to transport patient to [facility] 51M room 17

## 2021-05-28 NOTE — Assessment & Plan Note (Signed)
Continue zebeta, cardizem and eliquis.

## 2021-05-28 NOTE — Subjective & Objective (Signed)
CC: cough, hypoxia HPI: 87 year old female with a history of type 2 diabetes, chronic A. fib on Eliquis, hypertension, chronic systolic heart failure, chronic anxiety presents to the ER today as a transfer from [place].  Patient with approximately 10-day history of cough.  Intermittent fevers.  Patient seen at [facility] urgent care on 05/22/2021.  She tested negative for COVID, flu a and flu B.  Patient went home.  She has been having increasing cough.  Daughters been checking her oxygen saturations.  Today was less than 88% at home.  She was brought to the ER.  She was tested positive for COVID.  CTPA demonstrated COVID-pneumonia.  She was hypoxic.  Required supplemental oxygen.  Patient transferred to [facility] for further care.  Patient refused to give me a history or review of systems.  She deferred all HPI to her daughter.

## 2021-05-28 NOTE — Assessment & Plan Note (Signed)
Admit to telemetry bed. IV remdesivir. Po decadron. Follow inflammatory markers. procalcitonin negative. Doubt superimposed pneumonia. No abx for now. Discussed with pt's dtr lisa at bedside.

## 2021-05-28 NOTE — ED Notes (Signed)
Pt educated on proper use of MDI w/spacer by previous RT.

## 2021-05-28 NOTE — Assessment & Plan Note (Signed)
-   Continue home meds °

## 2021-05-28 NOTE — Assessment & Plan Note (Signed)
Continue cymbalta  

## 2021-05-28 NOTE — Assessment & Plan Note (Signed)
Stable. Continue zebeta, aldcatone, ARB.

## 2021-05-28 NOTE — ED Triage Notes (Signed)
11 days of cough, shob, and fever today, started on antibiotics on Tuesday for ? Pneumonia. Pt has occ productive cough,  Pt had sepsis beginning of November

## 2021-05-29 DIAGNOSIS — J1282 Pneumonia due to coronavirus disease 2019: Secondary | ICD-10-CM

## 2021-05-29 DIAGNOSIS — U071 COVID-19: Principal | ICD-10-CM

## 2021-05-29 LAB — CBC WITH DIFFERENTIAL/PLATELET
Abs Immature Granulocytes: 0.2 10*3/uL — ABNORMAL HIGH (ref 0.00–0.07)
Band Neutrophils: 6 %
Basophils Absolute: 0.1 10*3/uL (ref 0.0–0.1)
Basophils Relative: 1 %
Eosinophils Absolute: 0.1 10*3/uL (ref 0.0–0.5)
Eosinophils Relative: 1 %
HCT: 41.5 % (ref 36.0–46.0)
Hemoglobin: 14.1 g/dL (ref 12.0–15.0)
Lymphocytes Relative: 9 %
Lymphs Abs: 0.5 10*3/uL — ABNORMAL LOW (ref 0.7–4.0)
MCH: 32.9 pg (ref 26.0–34.0)
MCHC: 34 g/dL (ref 30.0–36.0)
MCV: 97 fL (ref 80.0–100.0)
Metamyelocytes Relative: 1 %
Monocytes Absolute: 0.2 10*3/uL (ref 0.1–1.0)
Monocytes Relative: 4 %
Myelocytes: 3 %
Neutro Abs: 4.9 10*3/uL (ref 1.7–7.7)
Neutrophils Relative %: 75 %
Platelets: 226 10*3/uL (ref 150–400)
RBC: 4.28 MIL/uL (ref 3.87–5.11)
RDW: 12.7 % (ref 11.5–15.5)
WBC: 6 10*3/uL (ref 4.0–10.5)
nRBC: 0 % (ref 0.0–0.2)
nRBC: 0 /100 WBC

## 2021-05-29 LAB — GLUCOSE, CAPILLARY
Glucose-Capillary: 111 mg/dL — ABNORMAL HIGH (ref 70–99)
Glucose-Capillary: 165 mg/dL — ABNORMAL HIGH (ref 70–99)
Glucose-Capillary: 173 mg/dL — ABNORMAL HIGH (ref 70–99)
Glucose-Capillary: 249 mg/dL — ABNORMAL HIGH (ref 70–99)
Glucose-Capillary: 310 mg/dL — ABNORMAL HIGH (ref 70–99)

## 2021-05-29 LAB — COMPREHENSIVE METABOLIC PANEL
ALT: 26 U/L (ref 0–44)
AST: 39 U/L (ref 15–41)
Albumin: 2.9 g/dL — ABNORMAL LOW (ref 3.5–5.0)
Alkaline Phosphatase: 42 U/L (ref 38–126)
Anion gap: 11 (ref 5–15)
BUN: 12 mg/dL (ref 8–23)
CO2: 25 mmol/L (ref 22–32)
Calcium: 9 mg/dL (ref 8.9–10.3)
Chloride: 98 mmol/L (ref 98–111)
Creatinine, Ser: 0.79 mg/dL (ref 0.44–1.00)
GFR, Estimated: >60 mL/min (ref >60)
Glucose, Bld: 123 mg/dL — ABNORMAL HIGH (ref 70–99)
Potassium: 4.5 mmol/L (ref 3.5–5.1)
Sodium: 134 mmol/L — ABNORMAL LOW (ref 135–145)
Total Bilirubin: 0.7 mg/dL (ref 0.3–1.2)
Total Protein: 6 g/dL — ABNORMAL LOW (ref 6.5–8.1)

## 2021-05-29 LAB — C-REACTIVE PROTEIN: CRP: 2.6 mg/dL — ABNORMAL HIGH (ref <1.0)

## 2021-05-29 LAB — LACTATE DEHYDROGENASE: LDH: 207 U/L — ABNORMAL HIGH (ref 98–192)

## 2021-05-29 LAB — FERRITIN: Ferritin: 210 ng/mL (ref 11–307)

## 2021-05-29 NOTE — Progress Notes (Signed)
Patient on room air saturating at 97% while ambulated and at rest.

## 2021-05-29 NOTE — Progress Notes (Signed)
PROGRESS NOTE   Natalie Mendoza  OFB:[id number] DOB: 11-26-1932 DOA: 05/28/2021 PCP: Cari Caraway, MD  Brief Narrative:  87 year old home dwelling white female Known history DM TY 2 with CKD 2 Chronic A. fib CHADS2 score >5 on Eliquis with right-sided stroke 2014,, possible TIA 2018 10 mm meningioma HFrEF 45-50% 2014 Day history of cough with intermittent fevers tested 05/22/2021 negative for COVID and flu Return to emergency room 12/8 with increasing cough found to have low O2 sats at home brought to ED Found to be COVID-positive at [place] but admitted secondary to hypoxia on admission  Hospital-Problem based course  COVID-positive Will utilize standard therapies remdesivir, Decadron 6 mg Trend inflammatory markers daily CT chest negative for pulmonary embolism Chronic A. fib CHADS2 score >4 on Eliquis Continue chronic anticoagulation Eliquis 5 twice daily, rate control Cardizem 60 at bedtime, Zebeta 5 at bedtime HFrEF Continue Avapro 75 at bedtime, Aldactone 25 at bedtime DM TY 2 Blood sugars ranging 162 250 Continue sliding scale at this time and expect poor control as is on Decadron Note not on any hypoglycemic agents prior to admission-we will need to consider oral meds on discharge if remains above 180 Prior CVA 2014 and 10 mm meningioma Anticoagulation as above Will need consideration for [employer] a reductase inhibitor as an outpatient  DVT prophylaxis:  Code Status:  Family Communication: called and d/w Sunday Spillers [phone] and left VM Disposition:  Status is: Inpatient  Remains inpatient appropriate because: Need for oxygen etc.  Consultants:    Procedures:   Antimicrobials:  Remdesivir   Subjective: Looks well very hard of hearing-does not feel short winded   Objective: Vitals:   05/28/21 2203 05/29/21 0653 05/29/21 0951 05/29/21 1606  BP: 104/75 101/74 (!) 88/61 (!) 97/58  Pulse: 83 61 62 (!) 55  Resp: 18 18 17 16   Temp: 98.4 F (36.9  C)  98.9 F (37.2 C) (!) 97.5 F (36.4 C)  TempSrc: Oral     SpO2: 98% 98% 93% 98%  Weight:      Height:        Intake/Output Summary (Last 24 hours) at 05/29/2021 1712 Last data filed at 05/29/2021 0850 Gross per 24 hour  Intake 480 ml  Output --  Net 480 ml   Filed Weights   05/28/21 1638 05/28/21 2202  Weight: 68 kg 67.6 kg    Examination:  Frail cachectic white female CTA B no rales rhonchi Abdomen soft no rebound no guarding Chest relatively clear no added sound S1-S2 irregularly irregular A. fib on monitors Abdomen soft no rebound no guarding no HSM No lower extremity edema  Data Reviewed: personally reviewed   CBC    Component Value Date/Time   WBC 6.0 05/29/2021 0319   RBC 4.28 05/29/2021 0319   HGB 14.1 05/29/2021 0319   HCT 41.5 05/29/2021 0319   PLT 226 05/29/2021 0319   MCV 97.0 05/29/2021 0319   MCH 32.9 05/29/2021 0319   MCHC 34.0 05/29/2021 0319   RDW 12.7 05/29/2021 0319   LYMPHSABS 0.5 (L) 05/29/2021 0319   MONOABS 0.2 05/29/2021 0319   EOSABS 0.1 05/29/2021 0319   BASOSABS 0.1 05/29/2021 0319   CMP Latest Ref Rng & Units 05/29/2021 05/28/2021 04/20/2021  Glucose 70 - 99 mg/dL 123(H) 138(H) 139(H)  BUN 8 - 23 mg/dL 12 13 16   Creatinine 0.44 - 1.00 mg/dL 0.79 0.95 0.68  Sodium 135 - 145 mmol/L 134(L) 133(L) 131(L)  Potassium 3.5 - 5.1 mmol/L 4.5 4.7 3.9  Chloride 98 - 111 mmol/L 98 93(L) 97(L)  CO2 22 - 32 mmol/L 25 30 27   Calcium 8.9 - 10.3 mg/dL 9.0 9.9 9.1  Total Protein 6.5 - 8.1 g/dL 6.0(L) - -  Total Bilirubin 0.3 - 1.2 mg/dL 0.7 - -  Alkaline Phos 38 - 126 U/L 42 - -  AST 15 - 41 U/L 39 - -  ALT 0 - 44 U/L 26 - -     Radiology Studies: DG Chest 2 View  Result Date: 05/28/2021 CLINICAL DATA:  Shortness of breath EXAM: CHEST - 2 VIEW COMPARISON:  04/16/2021 FINDINGS: Redemonstrated cardiomegaly. Heterogeneous opacities in the right lateral lung base. No pleural effusion or pneumothorax. No other focal pulmonary opacity. No acute  osseous abnormality. Surgical clips overlie the right hemithorax. IMPRESSION: Mild heterogeneous opacities in the right lateral lung base, which are nonspecific but could be infectious, inflammatory, or atelectasis. Electronically Signed   By: Merilyn Baba M.D.   On: 05/28/2021 17:13   CT Angio Chest PE W/Cm &/Or Wo Cm  Result Date: 05/28/2021 CLINICAL DATA:  Shortness of breath and fevers with cough and history of COVID-19 positivity, initial encounter EXAM: CT ANGIOGRAPHY CHEST WITH CONTRAST TECHNIQUE: Multidetector CT imaging of the chest was performed using the standard protocol during bolus administration of intravenous contrast. Multiplanar CT image reconstructions and MIPs were obtained to evaluate the vascular anatomy. CONTRAST:  63mL OMNIPAQUE IOHEXOL 350 MG/ML SOLN COMPARISON:  Chest x-ray from earlier in the same day. FINDINGS: Cardiovascular: Atherosclerotic calcifications of the thoracic aorta are noted without aneurysmal dilatation. Heart is at the upper limits of normal in size. The pulmonary artery is well visualized within normal enhancement pattern. No filling defect to suggest pulmonary embolism is identified. Mediastinum/Nodes: Thoracic inlet is within normal limits. No sizable hilar or mediastinal adenopathy is noted. Some small reactive lymph nodes are seen within the hila and mediastinum. The esophagus as visualized is within normal limits. Lungs/Pleura: Lungs are well aerated bilaterally. Patchy atelectatic changes are seen bilaterally. No focal infiltrate or sizable effusion is seen. Upper Abdomen: No acute abnormality. Musculoskeletal: No acute rib abnormality is noted. Degenerative changes of the thoracic spine are seen. Review of the MIP images confirms the above findings. IMPRESSION: No evidence of pulmonary emboli. Patchy atelectatic changes bilaterally. These correspond to that seen on recent plain film examination. Aortic Atherosclerosis (ICD10-I70.0). Electronically Signed   By:  Inez Catalina M.D.   On: 05/28/2021 19:47     Scheduled Meds:  apixaban  5 mg Oral BID   bisoprolol  5 mg Oral QHS   dexamethasone  6 mg Oral Daily   diltiazem  60 mg Oral QHS   DULoxetine  30 mg Oral QHS   insulin aspart  0-5 Units Subcutaneous QHS   insulin aspart  0-9 Units Subcutaneous TID WC   irbesartan  75 mg Oral QHS   melatonin  5 mg Oral QHS   spironolactone  25 mg Oral QHS   Continuous Infusions:  remdesivir 100 mg in NS 100 mL 100 mg (05/29/21 1305)     LOS: 1 day   Time spent: [place], MD [facility] To contact the attending provider between 7A-7P or the covering provider during after hours 7P-7A, please log into the web site [email or web address] and access using universal [place] password for that web site. If you do not have the password, please call the hospital operator.  05/29/2021, 5:12 PM

## 2021-05-29 NOTE — Progress Notes (Signed)
SATURATION QUALIFICATIONS: (This note is used to comply with regulatory documentation for home oxygen)  Patient Saturations on Room Air at Rest = 97%  Patient Saturations on Room Air while Ambulating = 97%  

## 2021-05-29 NOTE — Plan of Care (Signed)
  Problem: Clinical Measurements: Goal: Diagnostic test results will improve Outcome: Progressing   Problem: Clinical Measurements: Goal: Respiratory complications will improve Outcome: Progressing   

## 2021-05-30 ENCOUNTER — Other Ambulatory Visit: Payer: Self-pay | Admitting: Family Medicine

## 2021-05-30 LAB — COMPREHENSIVE METABOLIC PANEL
ALT: 33 U/L (ref 0–44)
AST: 38 U/L (ref 15–41)
Albumin: 3.4 g/dL — ABNORMAL LOW (ref 3.5–5.0)
Alkaline Phosphatase: 48 U/L (ref 38–126)
Anion gap: 12 (ref 5–15)
BUN: 35 mg/dL — ABNORMAL HIGH (ref 8–23)
CO2: 26 mmol/L (ref 22–32)
Calcium: 9.5 mg/dL (ref 8.9–10.3)
Chloride: 97 mmol/L — ABNORMAL LOW (ref 98–111)
Creatinine, Ser: 1 mg/dL (ref 0.44–1.00)
GFR, Estimated: 55 mL/min — ABNORMAL LOW (ref >60)
Glucose, Bld: 82 mg/dL (ref 70–99)
Potassium: 4.8 mmol/L (ref 3.5–5.1)
Sodium: 135 mmol/L (ref 135–145)
Total Bilirubin: 0.5 mg/dL (ref 0.3–1.2)
Total Protein: 7 g/dL (ref 6.5–8.1)

## 2021-05-30 LAB — CBC WITH DIFFERENTIAL/PLATELET
Abs Immature Granulocytes: 0.14 10*3/uL — ABNORMAL HIGH (ref 0.00–0.07)
Basophils Absolute: 0 10*3/uL (ref 0.0–0.1)
Basophils Relative: 0 %
Eosinophils Absolute: 0 10*3/uL (ref 0.0–0.5)
Eosinophils Relative: 0 %
HCT: 45.4 % (ref 36.0–46.0)
Hemoglobin: 15 g/dL (ref 12.0–15.0)
Immature Granulocytes: 1 %
Lymphocytes Relative: 11 %
Lymphs Abs: 1.5 10*3/uL (ref 0.7–4.0)
MCH: 32.1 pg (ref 26.0–34.0)
MCHC: 33 g/dL (ref 30.0–36.0)
MCV: 97.2 fL (ref 80.0–100.0)
Monocytes Absolute: 1.3 10*3/uL — ABNORMAL HIGH (ref 0.1–1.0)
Monocytes Relative: 9 %
Neutro Abs: 11.4 10*3/uL — ABNORMAL HIGH (ref 1.7–7.7)
Neutrophils Relative %: 79 %
Platelets: 288 10*3/uL (ref 150–400)
RBC: 4.67 MIL/uL (ref 3.87–5.11)
RDW: 12.5 % (ref 11.5–15.5)
WBC: 14.3 10*3/uL — ABNORMAL HIGH (ref 4.0–10.5)
nRBC: 0 % (ref 0.0–0.2)

## 2021-05-30 LAB — D-DIMER, QUANTITATIVE: D-Dimer, Quant: 0.27 ug/mL-FEU (ref 0.00–0.50)

## 2021-05-30 LAB — C-REACTIVE PROTEIN: CRP: 1.3 mg/dL — ABNORMAL HIGH (ref <1.0)

## 2021-05-30 LAB — GLUCOSE, CAPILLARY: Glucose-Capillary: 125 mg/dL — ABNORMAL HIGH (ref 70–99)

## 2021-05-30 LAB — PROCALCITONIN: Procalcitonin: 9.74 ng/mL

## 2021-05-30 MED ORDER — BLOOD GLUCOSE MONITOR KIT: PACK | 0 refills | Status: AC

## 2021-05-30 MED ORDER — FREESTYLE TEST VI STRP: ORAL_STRIP | 12 refills | Status: AC

## 2021-05-30 MED ORDER — FREESTYLE LANCETS MISC: 0 refills | Status: AC

## 2021-05-30 MED ORDER — GLIPIZIDE 5 MG PO TABS: 2.5000 mg | ORAL_TABLET | Freq: Two times a day (BID) | ORAL | 0 refills | Status: DC

## 2021-05-30 MED ORDER — SODIUM CHLORIDE 0.9 % IV SOLN: INTRAVENOUS | Status: DC

## 2021-05-30 MED ORDER — BLOOD GLUCOSE MONITOR KIT: PACK | 0 refills | Status: DC

## 2021-05-30 MED ORDER — DEXAMETHASONE 6 MG PO TABS: 6.0000 mg | ORAL_TABLET | Freq: Every day | ORAL | 0 refills | Status: DC

## 2021-05-30 NOTE — Progress Notes (Signed)
Daughter at bedside. PIV removed. AVS discussed with both patient and daughter, daughter verbalizes understanding of necessary medication regimen and follow up appointments. Educated daughter regarding steroids and increased glucose levels, demonstrated utilizing teach-back method correct steps for CBG monitoring. Meter, lancets and test strips called into [facility] per MD. Understanding conveyed by patient's daughter.

## 2021-05-30 NOTE — Plan of Care (Signed)

## 2021-05-30 NOTE — Progress Notes (Deleted)
Physician Discharge Summary  Natalie Mendoza:[id number] DOB: [birth year] DOA: 05/28/2021  PCP: Cari Caraway, MD  Admit date: 05/28/2021 Discharge date: 05/30/2021  Time spent: 46 minutes  Recommendations for Outpatient Follow-up:  Requires Chem-7, CBC in about a week Meds discontinued-losartan Aldactone Started this admission-glipizide 2.5 twice daily and given glucometer, also will go home on Decadron 6 mg until 12/13 for COVID  Discharge Diagnoses:  MAIN problem for hospitalization   Coronavirus and hypoxia  Please see below for itemized issues addressed in [place]- refer to other progress notes for clarity if needed  Discharge Condition: Improved  Diet recommendation: Regular  Filed Weights   05/28/21 1638 05/28/21 2202  Weight: 68 kg 67.6 kg    History of present illness:  87 year old home dwelling white female Known history diet controlled DM TY 2 with CKD 2 Chronic A. fib CHADS2 score >5 on Eliquis with right-sided stroke 2014, possible TIA 2018 10 mm meningioma HFrEF 45-50% 2014 Day history of cough with intermittent fevers tested 05/22/2021 negative for COVID and flu Return to emergency room 12/8 with increasing cough found to have low O2 sats at home brought to ED Found to be COVID-positive at [facility] but admitted secondary to hypoxia on admission  Hospital Course:  COVID-positive Received during hospital stay remdesivir 2 doses, was started on Decadron 6 mg orally CT chest was negative for pulmonary embolism Hypoxia on admission Hypoxia completely resolved on discharge Shared discussion making patient family with regards to Paxlovid--given this is renally cleared, patient is stable and not hypoxic and is already on Decadron which will help blunt the disease process I do not think there is necessity to prescribe the same at this time Mild AKI on admission Secondary to losartan, Aldactone which were held can be resumed afte repeat labs in about 5 to 7  days r Chronic A. fib CHADS2 score >4 on Eliquis Continue chronic anticoagulation Eliquis 5 twice daily, rate control Cardizem 60 at bedtime, Zebeta 5 at bedtime HFrEF Was on Ava pro/Aldactone which were discontinued as per above DM TY 2 Blood sugars ranging 162 250 poor control as is on Decadron Prescribed on discharge glipizide 2.5 twice daily Given glucometer teaching to be done with nursing prior to discharge Prior CVA 2014 and 10 mm meningioma Anticoagulation as above Will need consideration for [employer] a reductase inhibitor as an outpatient   Discharge Exam: Vitals:   05/29/21 2051 05/30/21 0405  BP: 101/70 102/73  Pulse: 85 (!) 59  Resp: 18 16  Temp: (!) 97.4 F (36.3 C) 97.6 F (36.4 C)  SpO2: 94% 94%    Subj on day of d/c   Awake coherent no distress slight confusion remembers me from yesterday has tried to get off the unit several times-seems quite anxious  General Exam on discharge  EOMI NCAT no focal deficit but a little confused CTA B no added sound no rales no rhonchi no wheeze S1-S2 no murmur no rub no gallop atrial fibrillation rate controlled on monitors Abdomen soft no rebound no guarding Neurologically intact Psych anxious appearing  Discharge Instructions   Discharge Instructions     Diet - low sodium heart healthy   Complete by: As directed    Discharge instructions   Complete by: As directed    Your diagnosis hospital stay with coronavirus and your symptoms were mild I do think that you will require steroids for about 3 days after discharge but do not think that you need any other specific targeted therapy at  this time-I think that the benefit of this is outweighed by the risk because of a little bit of dehydration that you had As a result we will request that you drink about 1.5 to 2 L of water a day, On steroids you can have slightly elevated blood sugars so we have started a low-dose of medication called glipizide and given you a limited  prescription of this We have recommended that he use a glucometer to check your blood sugars and get a close follow-up with your primary care physician in the outpatient setting as you may require either refills of this or discussions about next steps Overall you have not required any oxygen and have stabilized pretty well-please ensure that you get labs in the outpatient setting and follow-up 10 days from your diagnosis of COVID/85, anytime after 85 December It would be a good idea to maintain some level of isolation from other people other than close family members and continue wearing an N95 for at least 5 days after the diagnosis/85   Increase activity slowly   Complete by: As directed       Allergies as of 05/30/2021   No Known Allergies      Medication List     STOP taking these medications    amoxicillin-clavulanate 875-125 MG tablet Commonly known as: AUGMENTIN   Cholecalciferol 25 MCG (1000 UT) tablet   estradiol 0.1 MG/GM vaginal cream Commonly known as: ESTRACE   spironolactone 25 MG tablet Commonly known as: ALDACTONE   traMADol 50 MG tablet Commonly known as: ULTRAM   Tylenol Cold Multi-Symptom 5-6.25-10-325 MG/15ML Liqd Generic drug: Phenyleph-Doxylamine-DM-APAP   valsartan 80 MG tablet Commonly known as: DIOVAN       TAKE these medications    acetaminophen 650 MG CR tablet Commonly known as: TYLENOL Take 1,300 mg by mouth every 8 (eight) hours as needed for pain.   acetaminophen 325 MG tablet Commonly known as: TYLENOL Take 2 tablets (650 mg total) by mouth every 6 (six) hours as needed for mild pain, fever or headache.   apixaban 5 MG Tabs tablet Commonly known as: ELIQUIS Take 1 tablet (5 mg total) by mouth 2 (two) times daily.   bisoprolol 5 MG tablet Commonly known as: ZEBETA Take 5 mg by mouth daily.   blood glucose meter kit and supplies Kit Dispense based on patient and insurance preference. Use up to four times daily as directed.    dexamethasone 6 MG tablet Commonly known as: DECADRON Take 1 tablet (6 mg total) by mouth daily.   diltiazem 60 MG 12 hr capsule Commonly known as: CARDIZEM SR Take 1 capsule (60 mg total) by mouth every evening.   DULoxetine 30 MG capsule Commonly known as: CYMBALTA Take 30 mg by mouth at bedtime.   glipiZIDE 5 MG tablet Commonly known as: GLUCOTROL Take 0.5 tablets (2.5 mg total) by mouth 2 (two) times daily before a meal.   guaiFENesin-codeine 100-10 MG/5ML syrup Take 5 mLs by mouth every 6 (six) hours as needed for cough.   melatonin 5 MG Tabs Take 5 mg by mouth at bedtime.   Prolia 60 MG/ML Sosy injection Generic drug: denosumab Inject 60 mg into the skin every 6 (six) months.   Systane 0.4-0.3 % Gel ophthalmic gel Generic drug: Polyethyl Glycol-Propyl Glycol Place 1 application into both eyes 2 (two) times daily.       No Known Allergies    The results of significant diagnostics from this hospitalization (including imaging, microbiology, ancillary and  laboratory) are listed below for reference.    Significant Diagnostic Studies: DG Chest 2 View  Result Date: 05/28/2021 CLINICAL DATA:  Shortness of breath EXAM: CHEST - 2 VIEW COMPARISON:  04/16/2021 FINDINGS: Redemonstrated cardiomegaly. Heterogeneous opacities in the right lateral lung base. No pleural effusion or pneumothorax. No other focal pulmonary opacity. No acute osseous abnormality. Surgical clips overlie the right hemithorax. IMPRESSION: Mild heterogeneous opacities in the right lateral lung base, which are nonspecific but could be infectious, inflammatory, or atelectasis. Electronically Signed   By: Merilyn Baba M.D.   On: 05/28/2021 17:13   CT Angio Chest PE W/Cm &/Or Wo Cm  Result Date: 05/28/2021 CLINICAL DATA:  Shortness of breath and fevers with cough and history of COVID-19 positivity, initial encounter EXAM: CT ANGIOGRAPHY CHEST WITH CONTRAST TECHNIQUE: Multidetector CT imaging of the chest was  performed using the standard protocol during bolus administration of intravenous contrast. Multiplanar CT image reconstructions and MIPs were obtained to evaluate the vascular anatomy. CONTRAST:  56m OMNIPAQUE IOHEXOL 350 MG/ML SOLN COMPARISON:  Chest x-ray from earlier in the same day. FINDINGS: Cardiovascular: Atherosclerotic calcifications of the thoracic aorta are noted without aneurysmal dilatation. Heart is at the upper limits of normal in size. The pulmonary artery is well visualized within normal enhancement pattern. No filling defect to suggest pulmonary embolism is identified. Mediastinum/Nodes: Thoracic inlet is within normal limits. No sizable hilar or mediastinal adenopathy is noted. Some small reactive lymph nodes are seen within the hila and mediastinum. The esophagus as visualized is within normal limits. Lungs/Pleura: Lungs are well aerated bilaterally. Patchy atelectatic changes are seen bilaterally. No focal infiltrate or sizable effusion is seen. Upper Abdomen: No acute abnormality. Musculoskeletal: No acute rib abnormality is noted. Degenerative changes of the thoracic spine are seen. Review of the MIP images confirms the above findings. IMPRESSION: No evidence of pulmonary emboli. Patchy atelectatic changes bilaterally. These correspond to that seen on recent plain film examination. Aortic Atherosclerosis (ICD10-I70.0). Electronically Signed   By: MInez CatalinaM.D.   On: 05/28/2021 19:47    Microbiology: Recent Results (from the past 240 hour(s))  Resp Panel by RT-PCR (Flu A&B, Covid) Nasopharyngeal Swab     Status: Abnormal   Collection Time: 05/28/21  4:45 PM   Specimen: Nasopharyngeal Swab; Nasopharyngeal(NP) swabs in vial transport medium  Result Value Ref Range Status   SARS Coronavirus 2 by RT PCR POSITIVE (A) NEGATIVE Final    Comment: RESULT CALLED TO, READ BACK BY AND VERIFIED WITH: MLazaro Arms RN [id number] [place]ford(NOTE) SARS-CoV-2 target nucleic acids are  DETECTED.  The SARS-CoV-2 RNA is generally detectable in upper respiratory specimens during the acute phase of infection. Positive results are indicative of the presence of the identified virus, but do not rule out bacterial infection or co-infection with other pathogens not detected by the test. Clinical correlation with patient history and other diagnostic information is necessary to determine patient infection status. The expected result is Negative.  Fact Sheet for Patients: hEntrepreneurPulse.com.au Fact Sheet for Healthcare Providers: hIncredibleEmployment.be This test is not yet approved or cleared by the [place] and  has been authorized for detection and/or diagnosis of SARS-CoV-2 by FDA under an Emergency Use Authorization (EUA).  This EUA will remain in effect (meaning this test ca n be used) for the duration of  the COVID-19 declaration under Section 564(b)(1) of the Act, 21 U.S.C. section 360bbb-3(b)(1), unless the authorization is terminated or revoked sooner.     Influenza A by  PCR NEGATIVE NEGATIVE Final   Influenza B by PCR NEGATIVE NEGATIVE Final    Comment: (NOTE) The Xpert Xpress SARS-CoV-2/FLU/RSV plus assay is intended as an aid in the diagnosis of influenza from Nasopharyngeal swab specimens and should not be used as a sole basis for treatment. Nasal washings and aspirates are unacceptable for Xpert Xpress SARS-CoV-2/FLU/RSV testing.  Fact Sheet for Patients: EntrepreneurPulse.com.au  Fact Sheet for Healthcare Providers: IncredibleEmployment.be  This test is not yet approved or cleared by the [place] FDA and has been authorized for detection and/or diagnosis of SARS-CoV-2 by FDA under an Emergency Use Authorization (EUA). This EUA will remain in effect (meaning this test can be used) for the duration of the COVID-19 declaration under Section 564(b)(1) of the Act, 21  U.S.C. section 360bbb-3(b)(1), unless the authorization is terminated or revoked.  Performed at [facility], [address], [place], [place] [address]      Labs: Basic Metabolic Panel: Recent Labs  Lab 05/28/21 1655 05/29/21 0319 05/30/21 0237  NA 133* 134* 135  K 4.7 4.5 4.8  CL 93* 98 97*  CO2 30 25 26   GLUCOSE 138* 123* 82  BUN 13 12 35*  CREATININE 0.95 0.79 1.00  CALCIUM 9.9 9.0 9.5   Liver Function Tests: Recent Labs  Lab 05/29/21 0319 05/30/21 0237  AST 39 38  ALT 26 33  ALKPHOS 42 48  BILITOT 0.7 0.5  PROT 6.0* 7.0  ALBUMIN 2.9* 3.4*   No results for input(s): LIPASE, AMYLASE in the last 168 hours. No results for input(s): AMMONIA in the last 168 hours. CBC: Recent Labs  Lab 05/28/21 1655 05/29/21 0319 05/30/21 0237  WBC 6.4 6.0 14.3*  NEUTROABS  --  4.9 11.4*  HGB 14.4 14.1 15.0  HCT 44.6 41.5 45.4  MCV 98.0 97.0 97.2  PLT 252 226 288   Cardiac Enzymes: No results for input(s): CKTOTAL, CKMB, CKMBINDEX, TROPONINI in the last 168 hours. BNP: BNP (last 3 results) No results for input(s): BNP in the last 8760 hours.  ProBNP (last 3 results) No results for input(s): PROBNP in the last 8760 hours.  CBG: Recent Labs  Lab 05/29/21 0021 05/29/21 0628 05/29/21 1130 05/29/21 1604 05/29/21 2109  GLUCAP 111* 173* 249* 165* 310*       Signed:  Nita Sells MD   [facility] 05/30/2021, 9:31 AM

## 2021-06-02 NOTE — Discharge Summary (Signed)
Physician Discharge Summary  Natalie Mendoza MLY:[id number] DOB: 03-May-1933 DOA: 05/28/2021  PCP: Cari Caraway, MD  Admit date: 05/28/2021 Discharge date: 06/02/2021  Time spent: 46 minutes  Recommendations for Outpatient Follow-up:  Requires Chem-7, CBC in about a week Meds discontinued-losartan Aldactone Started this admission-glipizide 2.5 twice daily and given glucometer, also will go home on Decadron 6 mg until 12/13 for COVID  Discharge Diagnoses:  MAIN problem for hospitalization   Coronavirus and hypoxia  Please see below for itemized issues addressed in [place]- refer to other progress notes for clarity if needed  Discharge Condition: Improved  Diet recommendation: Regular  Filed Weights   05/28/21 1638 05/28/21 2202  Weight: 68 kg 67.6 kg    History of present illness:  87 year old home dwelling white female Known history diet controlled DM TY 2 with CKD 2 Chronic A. fib CHADS2 score >5 on Eliquis with right-sided stroke 2014, possible TIA 2018 10 mm meningioma HFrEF 45-50% 2014 Day history of cough with intermittent fevers tested 05/22/2021 negative for COVID and flu Return to emergency room 12/8 with increasing cough found to have low O2 sats at home brought to ED Found to be COVID-positive at [facility] but admitted secondary to hypoxia on admission  Hospital Course:  COVID-positive Received during hospital stay remdesivir 2 doses, was started on Decadron 6 mg orally CT chest was negative for pulmonary embolism Hypoxia on admission Hypoxia completely resolved on discharge Shared discussion making patient family with regards to Paxlovid--given this is renally cleared, patient is stable and not hypoxic and is already on Decadron which will help blunt the disease process I do not think there is necessity to prescribe the same at this time Mild AKI on admission Secondary to losartan, Aldactone which were held can be resumed afte repeat labs in about 5 to 7  days r Chronic A. fib CHADS2 score >4 on Eliquis Continue chronic anticoagulation Eliquis 5 twice daily, rate control Cardizem 60 at bedtime, Zebeta 5 at bedtime HFrEF Was on Ava pro/Aldactone which were discontinued as per above DM TY 2 Blood sugars ranging 162 250 poor control as is on Decadron Prescribed on discharge glipizide 2.5 twice daily Given glucometer teaching to be done with nursing prior to discharge Prior CVA 2014 and 10 mm meningioma Anticoagulation as above Will need consideration for [employer] a reductase inhibitor as an outpatient   Discharge Exam: Vitals:   05/30/21 0405 05/30/21 0905  BP: 102/73 94/79  Pulse: (!) 59 (!) 59  Resp: 16 18  Temp: 97.6 F (36.4 C) 97.8 F (36.6 C)  SpO2: 94% 93%    Subj on day of d/c   Awake coherent no distress slight confusion remembers me from yesterday has tried to get off the unit several times-seems quite anxious  General Exam on discharge  EOMI NCAT no focal deficit but a little confused CTA B no added sound no rales no rhonchi no wheeze S1-S2 no murmur no rub no gallop atrial fibrillation rate controlled on monitors Abdomen soft no rebound no guarding Neurologically intact Psych anxious appearing  Discharge Instructions   Discharge Instructions     Diet - low sodium heart healthy   Complete by: As directed    Discharge instructions   Complete by: As directed    Your diagnosis hospital stay with coronavirus and your symptoms were mild I do think that you will require steroids for about 3 days after discharge but do not think that you need any other specific targeted therapy at  this time-I think that the benefit of this is outweighed by the risk because of a little bit of dehydration that you had As a result we will request that you drink about 1.5 to 2 L of water a day, On steroids you can have slightly elevated blood sugars so we have started a low-dose of medication called glipizide and given you a limited  prescription of this We have recommended that he use a glucometer to check your blood sugars and get a close follow-up with your primary care physician in the outpatient setting as you may require either refills of this or discussions about next steps Overall you have not required any oxygen and have stabilized pretty well-please ensure that you get labs in the outpatient setting and follow-up 10 days from your diagnosis of COVID, anytime after 17 December It would be a good idea to maintain some level of isolation from other people other than close family members and continue wearing an N95 for at least 5 days after the diagnosis/05/2019   Increase activity slowly   Complete by: As directed       Allergies as of 05/30/2021   No Known Allergies      Medication List     STOP taking these medications    amoxicillin-clavulanate 875-125 MG tablet Commonly known as: AUGMENTIN   Cholecalciferol 25 MCG (1000 UT) tablet   estradiol 0.1 MG/GM vaginal cream Commonly known as: ESTRACE   spironolactone 25 MG tablet Commonly known as: ALDACTONE   traMADol 50 MG tablet Commonly known as: ULTRAM   Tylenol Cold Multi-Symptom 5-6.25-10-325 MG/15ML Liqd Generic drug: Phenyleph-Doxylamine-DM-APAP   valsartan 80 MG tablet Commonly known as: DIOVAN       TAKE these medications    acetaminophen 650 MG CR tablet Commonly known as: TYLENOL Take 1,300 mg by mouth every 8 (eight) hours as needed for pain.   acetaminophen 325 MG tablet Commonly known as: TYLENOL Take 2 tablets (650 mg total) by mouth every 6 (six) hours as needed for mild pain, fever or headache.   apixaban 5 MG Tabs tablet Commonly known as: ELIQUIS Take 1 tablet (5 mg total) by mouth 2 (two) times daily.   bisoprolol 5 MG tablet Commonly known as: ZEBETA Take 5 mg by mouth daily.   dexamethasone 6 MG tablet Commonly known as: DECADRON Take 1 tablet (6 mg total) by mouth daily.   diltiazem 60 MG 12 hr  capsule Commonly known as: CARDIZEM SR Take 1 capsule (60 mg total) by mouth every evening.   DULoxetine 30 MG capsule Commonly known as: CYMBALTA Take 30 mg by mouth at bedtime.   glipiZIDE 5 MG tablet Commonly known as: GLUCOTROL Take 0.5 tablets (2.5 mg total) by mouth 2 (two) times daily before a meal.   guaiFENesin-codeine 100-10 MG/5ML syrup Take 5 mLs by mouth every 6 (six) hours as needed for cough.   melatonin 5 MG Tabs Take 5 mg by mouth at bedtime.   Prolia 60 MG/ML Sosy injection Generic drug: denosumab Inject 60 mg into the skin every 6 (six) months.   Systane 0.4-0.3 % Gel ophthalmic gel Generic drug: Polyethyl Glycol-Propyl Glycol Place 1 application into both eyes 2 (two) times daily.       No Known Allergies    The results of significant diagnostics from this hospitalization (including imaging, microbiology, ancillary and laboratory) are listed below for reference.    Significant Diagnostic Studies: DG Chest 2 View  Result Date: 05/28/2021 CLINICAL DATA:  Shortness  of breath EXAM: CHEST - 2 VIEW COMPARISON:  04/16/2021 FINDINGS: Redemonstrated cardiomegaly. Heterogeneous opacities in the right lateral lung base. No pleural effusion or pneumothorax. No other focal pulmonary opacity. No acute osseous abnormality. Surgical clips overlie the right hemithorax. IMPRESSION: Mild heterogeneous opacities in the right lateral lung base, which are nonspecific but could be infectious, inflammatory, or atelectasis. Electronically Signed   By: Merilyn Baba M.D.   On: 05/28/2021 17:13   CT Angio Chest PE W/Cm &/Or Wo Cm  Result Date: 05/28/2021 CLINICAL DATA:  Shortness of breath and fevers with cough and history of COVID-19 positivity, initial encounter EXAM: CT ANGIOGRAPHY CHEST WITH CONTRAST TECHNIQUE: Multidetector CT imaging of the chest was performed using the standard protocol during bolus administration of intravenous contrast. Multiplanar CT image reconstructions  and MIPs were obtained to evaluate the vascular anatomy. CONTRAST:  35mL OMNIPAQUE IOHEXOL 350 MG/ML SOLN COMPARISON:  Chest x-ray from earlier in the same day. FINDINGS: Cardiovascular: Atherosclerotic calcifications of the thoracic aorta are noted without aneurysmal dilatation. Heart is at the upper limits of normal in size. The pulmonary artery is well visualized within normal enhancement pattern. No filling defect to suggest pulmonary embolism is identified. Mediastinum/Nodes: Thoracic inlet is within normal limits. No sizable hilar or mediastinal adenopathy is noted. Some small reactive lymph nodes are seen within the hila and mediastinum. The esophagus as visualized is within normal limits. Lungs/Pleura: Lungs are well aerated bilaterally. Patchy atelectatic changes are seen bilaterally. No focal infiltrate or sizable effusion is seen. Upper Abdomen: No acute abnormality. Musculoskeletal: No acute rib abnormality is noted. Degenerative changes of the thoracic spine are seen. Review of the MIP images confirms the above findings. IMPRESSION: No evidence of pulmonary emboli. Patchy atelectatic changes bilaterally. These correspond to that seen on recent plain film examination. Aortic Atherosclerosis (ICD10-I70.0). Electronically Signed   By: Inez Catalina M.D.   On: 05/28/2021 19:47    Microbiology: Recent Results (from the past 240 hour(s))  Resp Panel by RT-PCR (Flu A&B, Covid) Nasopharyngeal Swab     Status: Abnormal   Collection Time: 05/28/21  4:45 PM   Specimen: Nasopharyngeal Swab; Nasopharyngeal(NP) swabs in vial transport medium  Result Value Ref Range Status   SARS Coronavirus 2 by RT PCR POSITIVE (A) NEGATIVE Final    Comment: RESULT CALLED TO, READ BACK BY AND VERIFIED WITH: Lazaro Arms, RN [id number] 05/28/21 [place] (NOTE) SARS-CoV-2 target nucleic acids are DETECTED.  The SARS-CoV-2 RNA is generally detectable in upper respiratory specimens during the acute phase of infection. Positive  results are indicative of the presence of the identified virus, but do not rule out bacterial infection or co-infection with other pathogens not detected by the test. Clinical correlation with patient history and other diagnostic information is necessary to determine patient infection status. The expected result is Negative.  Fact Sheet for Patients: EntrepreneurPulse.com.au  Fact Sheet for Healthcare Providers: IncredibleEmployment.be  This test is not yet approved or cleared by the [place] FDA and  has been authorized for detection and/or diagnosis of SARS-CoV-2 by FDA under an Emergency Use Authorization (EUA).  This EUA will remain in effect (meaning this test ca n be used) for the duration of  the COVID-19 declaration under Section 564(b)(1) of the Act, 21 U.S.C. section 360bbb-3(b)(1), unless the authorization is terminated or revoked sooner.     Influenza A by PCR NEGATIVE NEGATIVE Final   Influenza B by PCR NEGATIVE NEGATIVE Final    Comment: (NOTE) The Xpert Xpress SARS-CoV-2/FLU/RSV plus assay  is intended as an aid in the diagnosis of influenza from Nasopharyngeal swab specimens and should not be used as a sole basis for treatment. Nasal washings and aspirates are unacceptable for Xpert Xpress SARS-CoV-2/FLU/RSV testing.  Fact Sheet for Patients: EntrepreneurPulse.com.au  Fact Sheet for Healthcare Providers: IncredibleEmployment.be  This test is not yet approved or cleared by the [place] FDA and has been authorized for detection and/or diagnosis of SARS-CoV-2 by FDA under an Emergency Use Authorization (EUA). This EUA will remain in effect (meaning this test can be used) for the duration of the COVID-19 declaration under Section 564(b)(1) of the Act, 21 U.S.C. section 360bbb-3(b)(1), unless the authorization is terminated or revoked.  Performed at [facility],  [address] [place], [place] [address]      Labs: Basic Metabolic Panel: Recent Labs  Lab 05/28/21 1655 05/29/21 0319 05/30/21 0237  NA 133* 134* 135  K 4.7 4.5 4.8  CL 93* 98 97*  CO2 30 25 26   GLUCOSE 138* 123* 82  BUN 13 12 35*  CREATININE 0.95 0.79 1.00  CALCIUM 9.9 9.0 9.5    Liver Function Tests: Recent Labs  Lab 05/29/21 0319 05/30/21 0237  AST 39 38  ALT 26 33  ALKPHOS 42 48  BILITOT 0.7 0.5  PROT 6.0* 7.0  ALBUMIN 2.9* 3.4*    No results for input(s): LIPASE, AMYLASE in the last 168 hours. No results for input(s): AMMONIA in the last 168 hours. CBC: Recent Labs  Lab 05/28/21 1655 05/29/21 0319 05/30/21 0237  WBC 6.4 6.0 14.3*  NEUTROABS  --  4.9 11.4*  HGB 14.4 14.1 15.0  HCT 44.6 41.5 45.4  MCV 98.0 97.0 97.2  PLT 252 226 288    Cardiac Enzymes: No results for input(s): CKTOTAL, CKMB, CKMBINDEX, TROPONINI in the last 168 hours. BNP: BNP (last 3 results) No results for input(s): BNP in the last 8760 hours.  ProBNP (last 3 results) No results for input(s): PROBNP in the last 8760 hours.  CBG: Recent Labs  Lab 05/29/21 0628 05/29/21 1130 05/29/21 1604 05/29/21 2109 05/30/21 1117  GLUCAP 173* 249* 165* 310* 125*        Signed:  Nita Sells MD   [facility] 06/02/2021, 4:22 PM

## 2021-07-02 ENCOUNTER — Ambulatory Visit
Admission: RE | Admit: 2021-07-02 | Discharge: 2021-07-02 | Disposition: A | Discharge status: Home / Self Care | Payer: Medicare Other | Source: Ambulatory Visit | Attending: Family Medicine | Admitting: Family Medicine

## 2021-07-02 ENCOUNTER — Other Ambulatory Visit: Payer: Self-pay | Admitting: Family Medicine

## 2021-07-02 DIAGNOSIS — M545 Low back pain, unspecified: Secondary | ICD-10-CM

## 2021-07-02 DIAGNOSIS — R059 Cough, unspecified: Secondary | ICD-10-CM

## 2022-05-04 ENCOUNTER — Ambulatory Visit
Admission: RE | Admit: 2022-05-04 | Discharge: 2022-05-04 | Disposition: A | Discharge status: Home / Self Care | Payer: Medicare Other | Source: Ambulatory Visit | Attending: Family Medicine | Admitting: Family Medicine

## 2022-05-04 ENCOUNTER — Other Ambulatory Visit: Payer: Self-pay | Admitting: Family Medicine

## 2022-05-04 DIAGNOSIS — R198 Other specified symptoms and signs involving the digestive system and abdomen: Secondary | ICD-10-CM

## 2022-05-27 ENCOUNTER — Encounter: Payer: Self-pay | Admitting: Internal Medicine

## 2022-07-15 ENCOUNTER — Ambulatory Visit
Admission: RE | Admit: 2022-07-15 | Discharge: 2022-07-15 | Disposition: A | Discharge status: Home / Self Care | Payer: Medicare Other | Source: Ambulatory Visit | Attending: Family Medicine | Admitting: Family Medicine

## 2022-07-15 ENCOUNTER — Other Ambulatory Visit: Payer: Self-pay | Admitting: Family Medicine

## 2022-07-15 DIAGNOSIS — R059 Cough, unspecified: Secondary | ICD-10-CM

## 2022-07-16 ENCOUNTER — Encounter: Payer: Self-pay | Admitting: *Deleted

## 2022-08-04 ENCOUNTER — Encounter: Payer: Self-pay | Admitting: Internal Medicine

## 2022-08-04 ENCOUNTER — Ambulatory Visit (INDEPENDENT_AMBULATORY_CARE_PROVIDER_SITE_OTHER): Payer: Medicare Other | Admitting: Internal Medicine

## 2022-08-04 VITALS — BP 106/68 | HR 65 | Ht 66.0 in | Wt 152.0 lb

## 2022-08-04 DIAGNOSIS — R159 Full incontinence of feces: Secondary | ICD-10-CM | POA: Diagnosis not present

## 2022-08-04 DIAGNOSIS — R198 Other specified symptoms and signs involving the digestive system and abdomen: Secondary | ICD-10-CM | POA: Diagnosis not present

## 2022-08-04 DIAGNOSIS — K5900 Constipation, unspecified: Secondary | ICD-10-CM | POA: Diagnosis not present

## 2022-08-04 DIAGNOSIS — Z7901 Long term (current) use of anticoagulants: Secondary | ICD-10-CM | POA: Diagnosis not present

## 2022-08-04 MED ORDER — NA SULFATE-K SULFATE-MG SULF 17.5-3.13-1.6 GM/177ML PO SOLN: 1.0000 | ORAL | 0 refills | Status: DC

## 2022-08-04 NOTE — Progress Notes (Signed)
Patient ID: Natalie Mendoza, female   DOB: [birth year], 89 y.o.   MRN: [id number] HPI: Natalie Mendoza is a 89 year old female with a past medical history of rheumatoid arthritis, atrial fibrillation on Eliquis, CHF with a EF last 50% by echo, CKD, type 2 diabetes, hypertension, vitamin D deficiency, pelvic prolapse with pessary in place who is seen to evaluate encopresis.  She is here today with her daughter.  She reports that for the last 1-1/2 to 2 years she has lost control of her bowels.  This limits her activities of daily living because she worries about passing stool uncontrollably.  She describes this as relatively small amounts which are passed without warning spontaneously.  There is no pain associated with this.  She has not seen recent blood in her stool.  She describes that a small amount of stool simply leaks out and can either be in her underwear or if she is not wearing closed would fall onto the floor.  She prior to the last 1 to 2 weeks was having a bowel movement every 1 to 3 days they were often hard small balls.  For the last couple of weeks she has had more firm form stool with better evacuation.  The stool symptoms predate the pessary placement.  She denies upper GI and hepatobiliary complaint.  Unclear if she is ever had a colonoscopy.  Her daughter states she has been actively involved in her medical care on a regular basis knows that she has not had a colonoscopy at least since 2007.  She will be seeing pulmonary next week for consultation for subacute cough without dyspnea or chest pain.  She follows with [facility] cardiology Dr. Charlane Ferretti and we will see him next Wednesday.  Past Medical History:  Diagnosis Date   Arrhythmia    Atrial Fib   Atrial fibrillation ([place])    failed cardioversion x 3 12/2013 at [facility]   Breast cancer [facility])    CHF (congestive heart failure) (HCC)    Chronic systolic CHF (congestive heart failure) (HCC)    CKD (chronic kidney disease) stage 2, GFR 60-89  ml/min    Diabetes mellitus, type II ([place])    Gallstone    Hearing impairment    Hx of joint problems    Hypertension    Major depressive disorder    Osteoarthritis    Osteoporosis    Pelvic relaxation    Rheumatoid arthritis ([place])    Stroke ([place])    fully resolved TIA   Transient cerebral ischemic attack    Vaginal pessary present    Vitamin D insufficiency     Past Surgical History:  Procedure Laterality Date   BREAST LUMPECTOMY     CATARACT EXTRACTION W/ INTRAOCULAR LENS  IMPLANT, BILATERAL Bilateral    REPLACEMENT TOTAL KNEE     left    Outpatient Medications Prior to Visit  Medication Sig Dispense Refill   acetaminophen (TYLENOL) 325 MG tablet Take 2 tablets (650 mg total) by mouth every 6 (six) hours as needed for mild pain, fever or headache.     acetaminophen (TYLENOL) 650 MG CR tablet Take 1,300 mg by mouth every 8 (eight) hours as needed for pain.     apixaban (ELIQUIS) 5 MG TABS tablet Take 1 tablet (5 mg total) by mouth 2 (two) times daily. 60 tablet 0   bisoprolol (ZEBETA) 5 MG tablet Take 5 mg by mouth daily.     blood glucose meter kit and supplies KIT Dispense based on patient  and insurance preference. Use up to four times daily as directed. 1 each 0   denosumab (PROLIA) 60 MG/ML SOSY injection Inject 60 mg into the skin every 6 (six) months.     diltiazem (CARDIZEM SR) 60 MG 12 hr capsule Take 1 capsule (60 mg total) by mouth every evening.     DULoxetine (CYMBALTA) 30 MG capsule Take 30 mg by mouth at bedtime.     glucose blood (FREESTYLE TEST STRIPS) test strip Please give generic test strips 60 for 1 month supply to be tested x 2 per day 100 each 12   guaiFENesin-codeine 100-10 MG/5ML syrup Take 5 mLs by mouth every 6 (six) hours as needed for cough.     melatonin 5 MG TABS Take 5 mg by mouth at bedtime.     Polyethyl Glycol-Propyl Glycol (SYSTANE) 0.4-0.3 % GEL ophthalmic gel Place 1 application into both eyes 2 (two) times daily.     spironolactone  (ALDACTONE) 25 MG tablet      blood glucose meter kit and supplies KIT Dispense based on patient and insurance preference. Use up to four times daily as directed. (Patient not taking: Reported on 08/04/2022) 1 each 0   dexamethasone (DECADRON) 6 MG tablet Take 1 tablet (6 mg total) by mouth daily. (Patient not taking: Reported on 08/04/2022) 3 tablet 0   Lancets (FREESTYLE) lancets Please give generic lancets 60 for 1 month supply to be tested x 2 per day (Patient not taking: Reported on 08/04/2022) 60 each 0   glipiZIDE (GLUCOTROL) 5 MG tablet Take 0.5 tablets (2.5 mg total) by mouth 2 (two) times daily before a meal. 20 tablet 0   No facility-administered medications prior to visit.    No Known Allergies  Family History  Problem Relation Age of Onset   Hypertension Mother    Stroke Mother    Ovarian cancer Maternal Grandmother    Liver disease Neg Hx    Esophageal cancer Neg Hx    Colon cancer Neg Hx     Social History   Tobacco Use   Smoking status: Never   Smokeless tobacco: Never  Vaping Use   Vaping Use: Never used  Substance Use Topics   Alcohol use: No   Drug use: No    ROS: As per history of present illness, otherwise negative  BP 106/68   Pulse 65   Ht 5' 6"$  (1.676 m)   Wt 152 lb (68.9 kg)   SpO2 95%   BMI 24.53 kg/m  Gen: awake, alert, NAD HEENT: anicteric  Abd: soft, NT/ND, +BS throughout Rectal: Small prolapsed internal hemorrhoids at the anal opening, no external hemorrhoids, no rash, the rectal tone is good there is no stool in the vault, with Valsalva there is rectal descent but mild paradoxical tightening of the anal sphincter; exam was nontender; no masses Ext: no c/c/e, changes of chronic venous stasis varicose veins Neuro: nonfocal  ASSESSMENT/PLAN:  89 year old female with a past medical history of rheumatoid arthritis, atrial fibrillation on Eliquis, CHF with a EF last 50% by echo, CKD, type 2 diabetes, hypertension, vitamin D deficiency, pelvic  prolapse with pessary in place who is seen to evaluate encopresis.  Encopresis/altered bowel habit and incomplete defecation --rectal exam was largely unremarkable.  Given her age and change in bowel habit it is appropriate to exclude structural abnormality with direct visualization.  We discussed how this is slightly higher than average risk based on her age, chronic anticoagulation.  She will discuss this with her  cardiologist next week to ensure he feels that she is medically appropriate for colonoscopy in the outpatient setting.  We will need to hold her Eliquis for 48 hours prior to this exam. There is very likely a component of pelvic floor dysfunction but also likely constipation with an overflow type bowel pattern. Plan as follows: -- Colonoscopy in the [place] after cardiology input -- Begin Metamucil 2 heaping tablespoons daily to help bulk stool, promote more normal defecation and hopefully reduce encopresis -- Depending on response to fiber and colonoscopy findings she may be very appropriate for pelvic floor physical therapy  Will hold Eliquis 2 days prior to endoscopic procedures - will instruct when and how to resume after procedure. Benefits and risks of procedure explained including risks of bleeding, perforation, infection, missed lesions, reactions to medications and possible need for hospitalization and surgery for complications. Additional rare but real risk of stroke or other vascular clotting events off Eliquis also explained and need to seek urgent help if any signs of these problems occur. Will communicate by phone or EMR with patient's  prescribing provider to confirm that holding Eliquis is reasonable in this case.   60 minutes total spent today including patient facing time, coordination of care, reviewing medical history/procedures/pertinent radiology studies, and documentation of the encounter.   [id number], [place], [place],  [place] [address]

## 2022-08-04 NOTE — Patient Instructions (Signed)
_______________________________________________________  If your blood pressure at your visit was 140/90 or greater, please contact your primary care physician to follow up on this.  _______________________________________________________  If you are age 87 or older, your body mass index should be between 23-30. Your Body mass index is 24.53 kg/m. If this is out of the aforementioned range listed, please consider follow up with your Primary Care Provider. ________________________________________________________  The [facility] providers would like to encourage you to use [facility] to communicate with providers for non-urgent requests or questions.  Due to long hold times on the telephone, sending your provider a message by [facility] may be a faster and more efficient way to get a response.  Please allow 48 business hours for a response.  Please remember that this is for non-urgent requests.  _______________________________________________________  Natalie Mendoza have been scheduled for a colonoscopy. Please follow written instructions given to you at your visit today.  Please pick up your prep supplies at the pharmacy within the next 1-3 days. If you use inhalers (even only as needed), please bring them with you on the day of your procedure.  Due to recent changes in healthcare laws, you may see the results of your imaging and laboratory studies on MyChart before your provider has had a chance to review them.  We understand that in some cases there may be results that are confusing or concerning to you. Not all laboratory results come back in the same time frame and the provider may be waiting for multiple results in order to interpret others.  Please give [place] 48 hours in order for your provider to thoroughly review all the results before contacting the office for clarification of your results.   Please purchase the following medications over the counter and take as directed:  Metamucil take 2 heaping  tablespoons daily  Please check with Dr Charlane Ferretti at [facility] to see if you can hold Eliquis for 2 days prior to procedure.  Thank you for entrusting me with your care and choosing [facility].  Dr Hilarie Fredrickson

## 2022-08-13 ENCOUNTER — Institutional Professional Consult (permissible substitution): Payer: Medicare Other | Admitting: Internal Medicine

## 2022-08-23 ENCOUNTER — Telehealth: Payer: Self-pay

## 2022-08-23 NOTE — Telephone Encounter (Signed)
Left message for patient's daughter Lattie Haw to return call to further discuss holding Eliquis x 2 days prior to colonoscopy.  Will continue efforts.

## 2022-08-26 NOTE — Telephone Encounter (Addendum)
I spoke with this patients daughter who is concerned about patient being off of Eliquis for procedure for fear of having a stroke.   Dr Charlane Ferretti from [facility] recommended, "You are at acceptable cardiovascular risk to have colonoscopy.  Take your last dose of eliquis the evening 2 days before, skip both doses the day before and skip the dose the morning of the procedure."  Please advise.

## 2022-08-26 NOTE — Telephone Encounter (Signed)
Left message for Natalie Mendoza to call back.

## 2022-08-26 NOTE — Telephone Encounter (Signed)
To the best of my knowledge this is felt to be safe and an acceptable risk for Eliquis disruption It is helpful to have cardiology's input as she has done While we cannot 100% mitigate the risk of cerebrovascular accident, interrupting Eliquis for this short period makes having a stroke very low risk

## 2022-08-27 NOTE — Telephone Encounter (Signed)
Patient daughter Natalie Mendoza is returning your call.

## 2022-08-27 NOTE — Telephone Encounter (Signed)
I have spoken to Natalie Mendoza and advised of Natalie Mendoza recommendation and advised that as per Natalie Mendoza, Natalie Mendoza may hold eliquis Thursday morning, Thursday evening and Friday morning for procedure. She verbalizes understanding of this.

## 2022-09-24 ENCOUNTER — Encounter: Payer: Self-pay | Admitting: Internal Medicine

## 2022-09-24 ENCOUNTER — Ambulatory Visit (AMBULATORY_SURGERY_CENTER): Payer: Medicare Other | Admitting: Internal Medicine

## 2022-09-24 VITALS — BP 110/61 | HR 51 | Temp 96.9°F | Resp 11 | Ht 66.0 in | Wt 152.0 lb

## 2022-09-24 DIAGNOSIS — D124 Benign neoplasm of descending colon: Secondary | ICD-10-CM

## 2022-09-24 DIAGNOSIS — R198 Other specified symptoms and signs involving the digestive system and abdomen: Secondary | ICD-10-CM

## 2022-09-24 DIAGNOSIS — D122 Benign neoplasm of ascending colon: Secondary | ICD-10-CM

## 2022-09-24 DIAGNOSIS — D120 Benign neoplasm of cecum: Secondary | ICD-10-CM | POA: Diagnosis not present

## 2022-09-24 DIAGNOSIS — R159 Full incontinence of feces: Secondary | ICD-10-CM

## 2022-09-24 DIAGNOSIS — K635 Polyp of colon: Secondary | ICD-10-CM | POA: Diagnosis not present

## 2022-09-24 MED ORDER — SODIUM CHLORIDE 0.9 % IV SOLN: 500.0000 mL | Freq: Once | INTRAVENOUS | Status: DC

## 2022-09-24 NOTE — Progress Notes (Signed)
GASTROENTEROLOGY PROCEDURE H&P NOTE   Primary Care Physician: Gweneth Dimitri, MD    Reason for Procedure:  Change in bowel habits and encopresis  Plan:    Colonoscopy  Patient is appropriate for endoscopic procedure(s) in the ambulatory ([facility]) setting.  The nature of the procedure, as well as the risks, benefits, and alternatives were carefully and thoroughly reviewed with the patient. Ample time for discussion and questions allowed. The patient understood, was satisfied, and agreed to proceed.     HPI: Natalie Mendoza is a 89 y.o. female who presents for diagnostic colonoscopy.  Medical history as below.  Tolerated the prep.  No recent chest pain or shortness of breath.  No abdominal pain today.  Eliquis on hold x 2 days  Past Medical History:  Diagnosis Date   Arrhythmia    Atrial Fib   Atrial fibrillation    failed cardioversion x 3 12/2013 at [facility]   Breast cancer    CHF (congestive heart failure)    Chronic systolic CHF (congestive heart failure)    CKD (chronic kidney disease) stage 2, GFR 60-89 ml/min    Diabetes mellitus, type II    Gallstone    Hearing impairment    Hx of joint problems    Hypertension    Major depressive disorder    Osteoarthritis    Osteoporosis    Pelvic relaxation    Rheumatoid arthritis    Stroke    fully resolved TIA   Transient cerebral ischemic attack    Vaginal pessary present    Vitamin D insufficiency     Past Surgical History:  Procedure Laterality Date   BREAST LUMPECTOMY     CATARACT EXTRACTION W/ INTRAOCULAR LENS  IMPLANT, BILATERAL Bilateral    REPLACEMENT TOTAL KNEE     left    Prior to Admission medications   Medication Sig Start Date End Date Taking? Authorizing Provider  bisoprolol (ZEBETA) 5 MG tablet Take 5 mg by mouth daily. 05/03/20  Yes [provider]  blood glucose meter kit and supplies KIT Dispense based on patient and insurance preference. Use up to four times daily as directed. 05/30/21  Yes  Rhetta Mura, MD  blood glucose meter kit and supplies KIT Dispense based on patient and insurance preference. Use up to four times daily as directed. 05/30/21  Yes Rhetta Mura, MD  diltiazem (CARDIZEM SR) 60 MG 12 hr capsule Take 1 capsule (60 mg total) by mouth every evening. 04/27/21  Yes Rodolph Bong, MD  DULoxetine (CYMBALTA) 30 MG capsule Take 30 mg by mouth at bedtime.   Yes [provider]  melatonin 5 MG TABS Take 5 mg by mouth at bedtime.   Yes [provider]  Polyethyl Glycol-Propyl Glycol (SYSTANE) 0.4-0.3 % GEL ophthalmic gel Place 1 application into both eyes 2 (two) times daily.   Yes [provider]  spironolactone (ALDACTONE) 25 MG tablet  10/13/21  Yes [provider]  acetaminophen (TYLENOL) 325 MG tablet Take 2 tablets (650 mg total) by mouth every 6 (six) hours as needed for mild pain, fever or headache. Patient not taking: Reported on 09/24/2022 04/20/21   Rodolph Bong, MD  acetaminophen (TYLENOL) 650 MG CR tablet Take 1,300 mg by mouth every 8 (eight) hours as needed for pain. Patient not taking: Reported on 09/24/2022    [provider]  apixaban (ELIQUIS) 5 MG TABS tablet Take 1 tablet (5 mg total) by mouth 2 (two) times daily. 03/15/13   Oti,  Osie Cheeks, MD  denosumab (PROLIA) 60 MG/ML SOSY injection Inject 60 mg into the skin every 6 (six) months. Patient not taking: Reported on 09/24/2022    [provider]  dexamethasone (DECADRON) 6 MG tablet Take 1 tablet (6 mg total) by mouth daily. Patient not taking: Reported on 08/04/2022 05/30/21   Rhetta Mura, MD  glucose blood (FREESTYLE TEST STRIPS) test strip Please give generic test strips 60 for 1 month supply to be tested x 2 per day Patient not taking: Reported on 09/24/2022 05/30/21   Rhetta Mura, MD  guaiFENesin-codeine 100-10 MG/5ML syrup Take 5 mLs by mouth every 6 (six) hours as needed for cough. Patient not taking: Reported on  09/24/2022 05/27/21   [provider]  Lancets (FREESTYLE) lancets Please give generic lancets 60 for 1 month supply to be tested x 2 per day Patient not taking: Reported on 08/04/2022 05/30/21   Rhetta Mura, MD    Current Outpatient Medications  Medication Sig Dispense Refill   bisoprolol (ZEBETA) 5 MG tablet Take 5 mg by mouth daily.     blood glucose meter kit and supplies KIT Dispense based on patient and insurance preference. Use up to four times daily as directed. 1 each 0   blood glucose meter kit and supplies KIT Dispense based on patient and insurance preference. Use up to four times daily as directed. 1 each 0   diltiazem (CARDIZEM SR) 60 MG 12 hr capsule Take 1 capsule (60 mg total) by mouth every evening.     DULoxetine (CYMBALTA) 30 MG capsule Take 30 mg by mouth at bedtime.     melatonin 5 MG TABS Take 5 mg by mouth at bedtime.     Polyethyl Glycol-Propyl Glycol (SYSTANE) 0.4-0.3 % GEL ophthalmic gel Place 1 application into both eyes 2 (two) times daily.     spironolactone (ALDACTONE) 25 MG tablet      acetaminophen (TYLENOL) 325 MG tablet Take 2 tablets (650 mg total) by mouth every 6 (six) hours as needed for mild pain, fever or headache. (Patient not taking: Reported on 09/24/2022)     acetaminophen (TYLENOL) 650 MG CR tablet Take 1,300 mg by mouth every 8 (eight) hours as needed for pain. (Patient not taking: Reported on 09/24/2022)     apixaban (ELIQUIS) 5 MG TABS tablet Take 1 tablet (5 mg total) by mouth 2 (two) times daily. 60 tablet 0   denosumab (PROLIA) 60 MG/ML SOSY injection Inject 60 mg into the skin every 6 (six) months. (Patient not taking: Reported on 09/24/2022)     dexamethasone (DECADRON) 6 MG tablet Take 1 tablet (6 mg total) by mouth daily. (Patient not taking: Reported on 08/04/2022) 3 tablet 0   glucose blood (FREESTYLE TEST STRIPS) test strip Please give generic test strips 60 for 1 month supply to be tested x 2 per day (Patient not taking: Reported  on 09/24/2022) 100 each 12   guaiFENesin-codeine 100-10 MG/5ML syrup Take 5 mLs by mouth every 6 (six) hours as needed for cough. (Patient not taking: Reported on 09/24/2022)     Lancets (FREESTYLE) lancets Please give generic lancets 60 for 1 month supply to be tested x 2 per day (Patient not taking: Reported on 08/04/2022) 60 each 0   Current Facility-Administered Medications  Medication Dose Route Frequency Provider Last Rate Last Admin   0.9 %  sodium chloride infusion  500 mL Intravenous Once Fatima Fedie, Carie Caddy, MD        Allergies as of 09/24/2022   (  No Known Allergies)    Family History  Problem Relation Age of Onset   Hypertension Mother    Stroke Mother    Ovarian cancer Maternal Grandmother    Liver disease Neg Hx    Esophageal cancer Neg Hx    Colon cancer Neg Hx     Social History   Socioeconomic History   Marital status: Widowed    Spouse name: Not on file   Number of children: 2   Years of education: Not on file   Highest education level: Not on file  Occupational History   Occupation: homemaker  Tobacco Use   Smoking status: Never   Smokeless tobacco: Never  Vaping Use   Vaping Use: Never used  Substance and Sexual Activity   Alcohol use: No   Drug use: No   Sexual activity: Not on file  Other Topics Concern   Not on file  Social History Narrative   Independent of ADLs and with ambulation.   Social Determinants of Health   Financial Resource Strain: Not on file  Food Insecurity: Not on file  Transportation Needs: Not on file  Physical Activity: Not on file  Stress: Not on file  Social Connections: Not on file  Intimate Partner Violence: Not on file    Physical Exam: Vital signs in last 24 hours: @BP  (!) 100/55   Pulse 64   Temp (!) 96.9 F (36.1 C) (Skin)   Ht 5\' 6"  (1.676 m)   Wt 152 lb (68.9 kg)   SpO2 91%   BMI 24.53 kg/m  GEN: NAD EYE: Sclerae anicteric ENT: MMM CV: Non-tachycardic Pulm: CTA b/l GI: Soft, NT/ND NEURO:  Alert &  Oriented x 3   Erick BlinksJay Clois Treanor, MD [facility]  09/24/2022 10:05 AM

## 2022-09-24 NOTE — Progress Notes (Signed)
Called to room to assist during endoscopic procedure.  Patient ID and intended procedure confirmed with present staff. Received instructions for my participation in the procedure from the performing physician.  

## 2022-09-24 NOTE — Op Note (Signed)
[facility] Patient Name: Natalie Mendoza Procedure Date: 09/24/2022 10:14 AM MRN: [id number] Endoscopist: Beverley Fiedler , MD, [id number] Age: 89 Referring MD:  Date of Birth: 05/05/1933 Gender: Female Account #: [id number] Procedure:                Colonoscopy Indications:              Change in bowel habits, Fecal incontinence Medicines:                Monitored Anesthesia Care Procedure:                Pre-Anesthesia Assessment:                           - Prior to the procedure, a History and Physical                            was performed, and patient medications and                            allergies were reviewed. The patient's tolerance of                            previous anesthesia was also reviewed. The risks                            and benefits of the procedure and the sedation                            options and risks were discussed with the patient.                            All questions were answered, and informed consent                            was obtained. Prior Anticoagulants: The patient has                            taken no anticoagulant or antiplatelet agents. ASA                            Grade Assessment: III - A patient with severe                            systemic disease. After reviewing the risks and                            benefits, the patient was deemed in satisfactory                            condition to undergo the procedure.                           After obtaining informed consent, the colonoscope  was passed under direct vision. Throughout the                            procedure, the patient's blood pressure, pulse, and                            oxygen saturations were monitored continuously. The                            Olympus [id number]-[id number]) Colonoscope was                            introduced through the anus and advanced to the                            cecum, identified  by appendiceal orifice and                            ileocecal valve. The colonoscopy was performed                            without difficulty. The patient tolerated the                            procedure well. The quality of the bowel                            preparation was fair clearing to adequate (as                            consequence of severe diverticulosis and                            inspissated stool). The ileocecal valve,                            appendiceal orifice, and rectum were photographed. Scope In: 10:17:54 AM Scope Out: 10:43:23 AM Scope Withdrawal Time: 0 hours 19 minutes 33 seconds  Total Procedure Duration: 0 hours 25 minutes 29 seconds  Findings:                 The digital rectal exam was normal.                           Two sessile polyps were found in the cecum. The                            polyps were 5 to 7 mm in size. These polyps were                            removed with a cold snare. Resection and retrieval                            were complete.  Six sessile polyps were found in the ascending                            colon. The polyps were 5 to 12 mm in size. These                            polyps were removed with a cold snare. Resection                            and retrieval were complete.                           A 14 mm polyp was found in the descending colon.                            The polyp was sessile. The polyp was removed with a                            cold snare. Resection and retrieval were complete.                           Many large-mouthed, medium-mouthed and                            small-mouthed diverticula were found in the sigmoid                            colon, descending colon and ascending colon.                           External and internal hemorrhoids were found during                            retroflexion. The hemorrhoids were small. Complications:             No immediate complications. Estimated Blood Loss:     Estimated blood loss was minimal. Impression:               - Two 5 to 7 mm polyps in the cecum, removed with a                            cold snare. Resected and retrieved.                           - Six 5 to 12 mm polyps in the ascending colon,                            removed with a cold snare. Resected and retrieved.                           - One 14 mm polyp in the descending colon, removed  with a cold snare. Resected and retrieved.                           - Severe diverticulosis in the sigmoid colon, in                            the descending colon and in the ascending colon.                            This is the likely cause of incomplete bowel                            movements and altered bowel habits.                           - Small external and internal hemorrhoids. Recommendation:           - Patient has a contact number available for                            emergencies. The signs and symptoms of potential                            delayed complications were discussed with the                            patient. Return to normal activities tomorrow.                            Written discharge instructions were provided to the                            patient.                           - Resume previous diet.                           - Continue present medications.                           - Resume Eliquis (apixaban) at prior dose tomorrow.                            Refer to managing physician for further adjustment                            of therapy.                           - Await pathology results.                           - Continue metamucil daily. Can add MiraLax if  needed. Pelvis floor PT if refractory bowel                            symptoms/encopresis.                           - No repeat colonoscopy due to age. Beverley Fiedler,  MD 09/24/2022 10:51:04 AM This report has been signed electronically.

## 2022-09-24 NOTE — Patient Instructions (Signed)
Handout on hemorrhoids, polyps, and diverticulosis given to patient. Await pathology results. Resume Eliquis (apixaban) at prior dose tomorrow 09/25/22. Refer to managing physician for further adjustment of therapy. Continue metamucil daily. Can add MiraLax if needed. Pelvis floor PT if refractory bowel symptoms/encopresis  No repeat colonoscopy due to age Resume previous diet and continue present medications    YOU HAD AN ENDOSCOPIC PROCEDURE TODAY AT THE [facility]:   Refer to the procedure report that was given to you for any specific questions about what was found during the examination.  If the procedure report does not answer your questions, please call your gastroenterologist to clarify.  If you requested that your care partner not be given the details of your procedure findings, then the procedure report has been included in a sealed envelope for you to review at your convenience later.  YOU SHOULD EXPECT: Some feelings of bloating in the abdomen. Passage of more gas than usual.  Walking can help get rid of the air that was put into your GI tract during the procedure and reduce the bloating. If you had a lower endoscopy (such as a colonoscopy or flexible sigmoidoscopy) you may notice spotting of blood in your stool or on the toilet paper. If you underwent a bowel prep for your procedure, you may not have a normal bowel movement for a few days.  Please Note:  You might notice some irritation and congestion in your nose or some drainage.  This is from the oxygen used during your procedure.  There is no need for concern and it should clear up in a day or so.  SYMPTOMS TO REPORT IMMEDIATELY:  Following lower endoscopy (colonoscopy or flexible sigmoidoscopy):  Excessive amounts of blood in the stool  Significant tenderness or worsening of abdominal pains  Swelling of the abdomen that is new, acute  Fever of 100F or higher  For urgent or emergent issues, a gastroenterologist can  be reached at any hour by calling [phone]. Do not use MyChart messaging for urgent concerns.    DIET:  We do recommend a small meal at first, but then you may proceed to your regular diet.  Drink plenty of fluids but you should avoid alcoholic beverages for 24 hours.  ACTIVITY:  You should plan to take it easy for the rest of today and you should NOT DRIVE or use heavy machinery until tomorrow (because of the sedation medicines used during the test).    FOLLOW UP: Our staff will call the number listed on your records the next business day following your procedure.  We will call around 7:15- 8:00 am to check on you and address any questions or concerns that you may have regarding the information given to you following your procedure. If we do not reach you, we will leave a message.     If any biopsies were taken you will be contacted by phone or by letter within the next 1-3 weeks.  Please call us at [phone] if you have not heard about the biopsies in 3 weeks.    SIGNATURES/CONFIDENTIALITY: You and/or your care partner have signed paperwork which will be entered into your electronic medical record.  These signatures attest to the fact that that the information above on your After Visit Summary has been reviewed and is understood.  Full responsibility of the confidentiality of this discharge information lies with you and/or your care-partner.

## 2022-09-24 NOTE — Progress Notes (Signed)
Sedate, gd SR, tolerated procedure well, VSS, report to RN 

## 2022-09-27 ENCOUNTER — Telehealth: Payer: Self-pay

## 2022-09-27 NOTE — Telephone Encounter (Signed)
  Follow up Call-     09/24/2022    9:50 AM  Call back number  Post procedure Call Back phone  # [phone]  Permission to leave phone message Yes     Patient questions:  Do you have a fever, pain , or abdominal swelling? No. Pain Score  0 *  Have you tolerated food without any problems? Yes.    Have you been able to return to your normal activities? No.  Do you have any questions about your discharge instructions: Diet   No. Medications  No. Follow up visit  No.  Do you have questions or concerns about your Care? No.  Actions: * If pain score is 4 or above: No action needed, pain <4.

## 2022-09-29 ENCOUNTER — Encounter (HOSPITAL_COMMUNITY): Payer: Self-pay | Admitting: Emergency Medicine

## 2022-09-29 ENCOUNTER — Other Ambulatory Visit: Payer: Self-pay

## 2022-09-29 ENCOUNTER — Inpatient Hospital Stay (HOSPITAL_COMMUNITY)
Admission: EM | Admit: 2022-09-29 | Discharge: 2022-10-01 | DRG: 917 | Disposition: A | Discharge status: Home / Self Care | Payer: Medicare Other | Attending: Internal Medicine | Admitting: Internal Medicine

## 2022-09-29 ENCOUNTER — Observation Stay (HOSPITAL_COMMUNITY): Payer: Medicare Other

## 2022-09-29 ENCOUNTER — Encounter: Payer: Self-pay | Admitting: Internal Medicine

## 2022-09-29 ENCOUNTER — Ambulatory Visit: Payer: Self-pay

## 2022-09-29 ENCOUNTER — Emergency Department (HOSPITAL_COMMUNITY): Payer: Medicare Other

## 2022-09-29 DIAGNOSIS — D72819 Decreased white blood cell count, unspecified: Secondary | ICD-10-CM | POA: Diagnosis present

## 2022-09-29 DIAGNOSIS — R4182 Altered mental status, unspecified: Secondary | ICD-10-CM | POA: Diagnosis not present

## 2022-09-29 DIAGNOSIS — Z79890 Hormone replacement therapy (postmenopausal): Secondary | ICD-10-CM

## 2022-09-29 DIAGNOSIS — I482 Chronic atrial fibrillation, unspecified: Secondary | ICD-10-CM | POA: Diagnosis not present

## 2022-09-29 DIAGNOSIS — Z8249 Family history of ischemic heart disease and other diseases of the circulatory system: Secondary | ICD-10-CM

## 2022-09-29 DIAGNOSIS — Z8041 Family history of malignant neoplasm of ovary: Secondary | ICD-10-CM

## 2022-09-29 DIAGNOSIS — Z9842 Cataract extraction status, left eye: Secondary | ICD-10-CM

## 2022-09-29 DIAGNOSIS — T443X1A Poisoning by other parasympatholytics [anticholinergics and antimuscarinics] and spasmolytics, accidental (unintentional), initial encounter: Secondary | ICD-10-CM | POA: Diagnosis not present

## 2022-09-29 DIAGNOSIS — I130 Hypertensive heart and chronic kidney disease with heart failure and stage 1 through stage 4 chronic kidney disease, or unspecified chronic kidney disease: Secondary | ICD-10-CM | POA: Diagnosis present

## 2022-09-29 DIAGNOSIS — Z823 Family history of stroke: Secondary | ICD-10-CM

## 2022-09-29 DIAGNOSIS — N182 Chronic kidney disease, stage 2 (mild): Secondary | ICD-10-CM | POA: Diagnosis present

## 2022-09-29 DIAGNOSIS — I5032 Chronic diastolic (congestive) heart failure: Secondary | ICD-10-CM | POA: Diagnosis present

## 2022-09-29 DIAGNOSIS — Z96652 Presence of left artificial knee joint: Secondary | ICD-10-CM | POA: Diagnosis present

## 2022-09-29 DIAGNOSIS — I10 Essential (primary) hypertension: Secondary | ICD-10-CM | POA: Diagnosis present

## 2022-09-29 DIAGNOSIS — Z9841 Cataract extraction status, right eye: Secondary | ICD-10-CM

## 2022-09-29 DIAGNOSIS — F329 Major depressive disorder, single episode, unspecified: Secondary | ICD-10-CM | POA: Diagnosis present

## 2022-09-29 DIAGNOSIS — T426X1A Poisoning by other antiepileptic and sedative-hypnotic drugs, accidental (unintentional), initial encounter: Secondary | ICD-10-CM | POA: Diagnosis not present

## 2022-09-29 DIAGNOSIS — I959 Hypotension, unspecified: Secondary | ICD-10-CM | POA: Diagnosis present

## 2022-09-29 DIAGNOSIS — J1000 Influenza due to other identified influenza virus with unspecified type of pneumonia: Secondary | ICD-10-CM | POA: Diagnosis present

## 2022-09-29 DIAGNOSIS — Y92009 Unspecified place in unspecified non-institutional (private) residence as the place of occurrence of the external cause: Secondary | ICD-10-CM

## 2022-09-29 DIAGNOSIS — Z853 Personal history of malignant neoplasm of breast: Secondary | ICD-10-CM

## 2022-09-29 DIAGNOSIS — Z1152 Encounter for screening for COVID-19: Secondary | ICD-10-CM

## 2022-09-29 DIAGNOSIS — J209 Acute bronchitis, unspecified: Secondary | ICD-10-CM | POA: Diagnosis present

## 2022-09-29 DIAGNOSIS — Z7901 Long term (current) use of anticoagulants: Secondary | ICD-10-CM

## 2022-09-29 DIAGNOSIS — Z8673 Personal history of transient ischemic attack (TIA), and cerebral infarction without residual deficits: Secondary | ICD-10-CM

## 2022-09-29 DIAGNOSIS — R5381 Other malaise: Secondary | ICD-10-CM | POA: Diagnosis present

## 2022-09-29 DIAGNOSIS — Z79899 Other long term (current) drug therapy: Secondary | ICD-10-CM

## 2022-09-29 DIAGNOSIS — E1122 Type 2 diabetes mellitus with diabetic chronic kidney disease: Secondary | ICD-10-CM | POA: Diagnosis present

## 2022-09-29 DIAGNOSIS — Z961 Presence of intraocular lens: Secondary | ICD-10-CM | POA: Diagnosis present

## 2022-09-29 DIAGNOSIS — Z66 Do not resuscitate: Secondary | ICD-10-CM | POA: Diagnosis present

## 2022-09-29 DIAGNOSIS — E872 Acidosis, unspecified: Secondary | ICD-10-CM | POA: Diagnosis present

## 2022-09-29 DIAGNOSIS — G928 Other toxic encephalopathy: Secondary | ICD-10-CM | POA: Diagnosis present

## 2022-09-29 DIAGNOSIS — M069 Rheumatoid arthritis, unspecified: Secondary | ICD-10-CM | POA: Diagnosis present

## 2022-09-29 DIAGNOSIS — M810 Age-related osteoporosis without current pathological fracture: Secondary | ICD-10-CM | POA: Diagnosis present

## 2022-09-29 DIAGNOSIS — H9190 Unspecified hearing loss, unspecified ear: Secondary | ICD-10-CM | POA: Diagnosis present

## 2022-09-29 LAB — CBC WITH DIFFERENTIAL/PLATELET
Abs Immature Granulocytes: 0.02 10*3/uL (ref 0.00–0.07)
Basophils Absolute: 0 10*3/uL (ref 0.0–0.1)
Basophils Relative: 1 %
Eosinophils Absolute: 0 10*3/uL (ref 0.0–0.5)
Eosinophils Relative: 1 %
HCT: 45.6 % (ref 36.0–46.0)
Hemoglobin: 14.9 g/dL (ref 12.0–15.0)
Immature Granulocytes: 1 %
Lymphocytes Relative: 27 %
Lymphs Abs: 1.1 10*3/uL (ref 0.7–4.0)
MCH: 32.3 pg (ref 26.0–34.0)
MCHC: 32.7 g/dL (ref 30.0–36.0)
MCV: 98.9 fL (ref 80.0–100.0)
Monocytes Absolute: 0.5 10*3/uL (ref 0.1–1.0)
Monocytes Relative: 12 %
Neutro Abs: 2.5 10*3/uL (ref 1.7–7.7)
Neutrophils Relative %: 58 %
Platelets: 133 10*3/uL — ABNORMAL LOW (ref 150–400)
RBC: 4.61 MIL/uL (ref 3.87–5.11)
RDW: 13.1 % (ref 11.5–15.5)
WBC: 4.1 10*3/uL (ref 4.0–10.5)
nRBC: 0 % (ref 0.0–0.2)

## 2022-09-29 LAB — LACTIC ACID, PLASMA
Lactic Acid, Venous: 2.9 mmol/L (ref 0.5–1.9)
Lactic Acid, Venous: 1.7 mmol/L (ref 0.5–1.9)

## 2022-09-29 LAB — COMPREHENSIVE METABOLIC PANEL
ALT: 18 U/L (ref 0–44)
AST: 35 U/L (ref 15–41)
Albumin: 3.1 g/dL — ABNORMAL LOW (ref 3.5–5.0)
Alkaline Phosphatase: 36 U/L — ABNORMAL LOW (ref 38–126)
Anion gap: 13 (ref 5–15)
BUN: 14 mg/dL (ref 8–23)
CO2: 22 mmol/L (ref 22–32)
Calcium: 8 mg/dL — ABNORMAL LOW (ref 8.9–10.3)
Chloride: 101 mmol/L (ref 98–111)
Creatinine, Ser: 1.05 mg/dL — ABNORMAL HIGH (ref 0.44–1.00)
GFR, Estimated: 51 mL/min — ABNORMAL LOW (ref >60)
Glucose, Bld: 110 mg/dL — ABNORMAL HIGH (ref 70–99)
Potassium: 4.4 mmol/L (ref 3.5–5.1)
Sodium: 136 mmol/L (ref 135–145)
Total Bilirubin: 1.4 mg/dL — ABNORMAL HIGH (ref 0.3–1.2)
Total Protein: 5.8 g/dL — ABNORMAL LOW (ref 6.5–8.1)

## 2022-09-29 LAB — RESP PANEL BY RT-PCR (RSV, FLU A&B, COVID)  RVPGX2
Influenza A by PCR: POSITIVE — AB
Influenza B by PCR: NEGATIVE
Resp Syncytial Virus by PCR: NEGATIVE
SARS Coronavirus 2 by RT PCR: NEGATIVE

## 2022-09-29 LAB — APTT: aPTT: 32 seconds (ref 24–36)

## 2022-09-29 LAB — PROTIME-INR
INR: 1.5 — ABNORMAL HIGH (ref 0.8–1.2)
Prothrombin Time: 17.7 seconds — ABNORMAL HIGH (ref 11.4–15.2)

## 2022-09-29 MED ORDER — SODIUM CHLORIDE 0.9 % IV SOLN
500.0000 mg | Freq: Once | INTRAVENOUS | Status: AC
  Administered 2022-09-29: 500 mg via INTRAVENOUS
  Filled 2022-09-29: qty 5

## 2022-09-29 MED ORDER — ACETAMINOPHEN 650 MG RE SUPP: 650.0000 mg | Freq: Four times a day (QID) | RECTAL | Status: DC | PRN

## 2022-09-29 MED ORDER — GUAIFENESIN ER 600 MG PO TB12
600.0000 mg | ORAL_TABLET | Freq: Two times a day (BID) | ORAL | Status: DC
  Administered 2022-09-29 – 2022-10-01 (×4): 600 mg via ORAL
  Filled 2022-09-29 (×4): qty 1

## 2022-09-29 MED ORDER — SODIUM CHLORIDE 0.9 % IV SOLN: INTRAVENOUS | Status: AC

## 2022-09-29 MED ORDER — LACTATED RINGERS IV BOLUS (SEPSIS)
1000.0000 mL | Freq: Once | INTRAVENOUS | Status: AC
  Administered 2022-09-29: 1000 mL via INTRAVENOUS

## 2022-09-29 MED ORDER — APIXABAN 5 MG PO TABS
5.0000 mg | ORAL_TABLET | Freq: Two times a day (BID) | ORAL | Status: DC
  Administered 2022-09-29 – 2022-10-01 (×4): 5 mg via ORAL
  Filled 2022-09-29 (×4): qty 1

## 2022-09-29 MED ORDER — ALBUTEROL SULFATE (2.5 MG/3ML) 0.083% IN NEBU: 2.5000 mg | INHALATION_SOLUTION | RESPIRATORY_TRACT | Status: DC | PRN

## 2022-09-29 MED ORDER — HALOPERIDOL LACTATE 5 MG/ML IJ SOLN
1.0000 mg | Freq: Once | INTRAMUSCULAR | Status: AC | PRN
  Administered 2022-09-29: 1 mg via INTRAVENOUS
  Filled 2022-09-29: qty 1

## 2022-09-29 MED ORDER — ONDANSETRON HCL 4 MG PO TABS: 4.0000 mg | ORAL_TABLET | Freq: Four times a day (QID) | ORAL | Status: DC | PRN

## 2022-09-29 MED ORDER — IOHEXOL 350 MG/ML SOLN
70.0000 mL | Freq: Once | INTRAVENOUS | Status: AC | PRN
  Administered 2022-09-29: 70 mL via INTRAVENOUS

## 2022-09-29 MED ORDER — POLYETHYLENE GLYCOL 3350 17 G PO PACK: 17.0000 g | PACK | Freq: Every day | ORAL | Status: DC | PRN

## 2022-09-29 MED ORDER — ONDANSETRON HCL 4 MG/2ML IJ SOLN: 4.0000 mg | Freq: Four times a day (QID) | INTRAMUSCULAR | Status: DC | PRN

## 2022-09-29 MED ORDER — LACTATED RINGERS IV BOLUS: 1250.0000 mL/kg | Freq: Once | INTRAVENOUS | Status: DC

## 2022-09-29 MED ORDER — SODIUM CHLORIDE 0.9 % IV SOLN
2.0000 g | Freq: Once | INTRAVENOUS | Status: AC
  Administered 2022-09-29: 2 g via INTRAVENOUS
  Filled 2022-09-29: qty 20

## 2022-09-29 MED ORDER — ACETAMINOPHEN 325 MG PO TABS
650.0000 mg | ORAL_TABLET | Freq: Four times a day (QID) | ORAL | Status: DC | PRN
  Administered 2022-09-30: 650 mg via ORAL
  Filled 2022-09-29: qty 2

## 2022-09-29 MED ORDER — LACTATED RINGERS IV BOLUS
1250.0000 mL | Freq: Once | INTRAVENOUS | Status: AC
  Administered 2022-09-29: 1250 mL via INTRAVENOUS

## 2022-09-29 MED ORDER — SODIUM CHLORIDE 0.9 % IV SOLN: INTRAVENOUS | Status: DC

## 2022-09-29 MED ORDER — OSELTAMIVIR PHOSPHATE 75 MG PO CAPS
75.0000 mg | ORAL_CAPSULE | Freq: Once | ORAL | Status: AC
  Administered 2022-09-29: 75 mg via ORAL
  Filled 2022-09-29: qty 1

## 2022-09-29 MED ORDER — OSELTAMIVIR PHOSPHATE 75 MG PO CAPS: 75.0000 mg | ORAL_CAPSULE | Freq: Two times a day (BID) | ORAL | Status: DC

## 2022-09-29 MED ORDER — OSELTAMIVIR PHOSPHATE 30 MG PO CAPS
30.0000 mg | ORAL_CAPSULE | Freq: Two times a day (BID) | ORAL | Status: DC
  Administered 2022-09-30 – 2022-10-01 (×3): 30 mg via ORAL
  Filled 2022-09-29 (×4): qty 1

## 2022-09-29 NOTE — ED Provider Notes (Signed)
[place] EMERGENCY DEPARTMENT AT [facility] Provider Note   CSN: [id number] Arrival date & time: 09/29/22  1346     History  Chief Complaint  Patient presents with   Drug Overdose    Natalie Mendoza is a 89 y.o. female.  89 yo F with a chief complaints of altered mental status.  The patient was found by EMS acutely altered.  According to them she was also very hypotensive.  Reportedly was being treated for pneumonia.  Had been given a prescription for cough medicine and was found to have a bottle that was almost completely empty.  Thought that she had about 120 mL of a Phenergan DM cough medication.  Patient is confused.  Difficult to get much history from her.  Level 5 caveat.   Drug Overdose       Home Medications Prior to Admission medications   Medication Sig Start Date End Date Taking? Authorizing Provider  acetaminophen (TYLENOL) 325 MG tablet Take 2 tablets (650 mg total) by mouth every 6 (six) hours as needed for mild pain, fever or headache. Patient not taking: Reported on 09/24/2022 04/20/21   Rodolph Bong, MD  acetaminophen (TYLENOL) 650 MG CR tablet Take 1,300 mg by mouth every 8 (eight) hours as needed for pain. Patient not taking: Reported on 09/24/2022    [provider]  apixaban (ELIQUIS) 5 MG TABS tablet Take 1 tablet (5 mg total) by mouth 2 (two) times daily. 03/15/13   Laveda Norman, MD  bisoprolol (ZEBETA) 5 MG tablet Take 5 mg by mouth daily. 05/03/20   [provider]  blood glucose meter kit and supplies KIT Dispense based on patient and insurance preference. Use up to four times daily as directed. 05/30/21   Rhetta Mura, MD  blood glucose meter kit and supplies KIT Dispense based on patient and insurance preference. Use up to four times daily as directed. 05/30/21   Rhetta Mura, MD  denosumab (PROLIA) 60 MG/ML SOSY injection Inject 60 mg into the skin every 6 (six) months. Patient not taking: Reported on  09/24/2022    [provider]  dexamethasone (DECADRON) 6 MG tablet Take 1 tablet (6 mg total) by mouth daily. Patient not taking: Reported on 08/04/2022 05/30/21   Rhetta Mura, MD  diltiazem (CARDIZEM SR) 60 MG 12 hr capsule Take 1 capsule (60 mg total) by mouth every evening. 04/27/21   Rodolph Bong, MD  DULoxetine (CYMBALTA) 30 MG capsule Take 30 mg by mouth at bedtime.    [provider]  glucose blood (FREESTYLE TEST STRIPS) test strip Please give generic test strips 60 for 1 month supply to be tested x 2 per day Patient not taking: Reported on 09/24/2022 05/30/21   Rhetta Mura, MD  guaiFENesin-codeine 100-10 MG/5ML syrup Take 5 mLs by mouth every 6 (six) hours as needed for cough. Patient not taking: Reported on 09/24/2022 05/27/21   [provider]  Lancets (FREESTYLE) lancets Please give generic lancets 60 for 1 month supply to be tested x 2 per day Patient not taking: Reported on 08/04/2022 05/30/21   Rhetta Mura, MD  melatonin 5 MG TABS Take 5 mg by mouth at bedtime.    [provider]  Polyethyl Glycol-Propyl Glycol (SYSTANE) 0.4-0.3 % GEL ophthalmic gel Place 1 application into both eyes 2 (two) times daily.    [provider]  spironolactone (ALDACTONE) 25 MG tablet  10/13/21   [provider]      Allergies  Patient has no known allergies.    Review of Systems   Review of Systems  Physical Exam Updated Vital Signs There were no vitals taken for this visit. Physical Exam Vitals and nursing note reviewed.  Constitutional:      General: She is not in acute distress.    Appearance: She is well-developed. She is not diaphoretic.  HENT:     Head: Normocephalic and atraumatic.  Eyes:     Pupils: Pupils are equal, round, and reactive to light.  Cardiovascular:     Rate and Rhythm: Normal rate and regular rhythm.     Heart sounds: No murmur heard.    No friction rub. No gallop.  Pulmonary:      Effort: Pulmonary effort is normal.     Breath sounds: No wheezing or rales.  Abdominal:     General: There is no distension.     Palpations: Abdomen is soft.     Tenderness: There is no abdominal tenderness.  Musculoskeletal:        General: No tenderness.     Cervical back: Normal range of motion and neck supple.  Skin:    General: Skin is warm and dry.  Neurological:     Mental Status: She is alert and oriented to person, place, and time.  Psychiatric:        Behavior: Behavior normal.     ED Results / Procedures / Treatments   Labs (all labs ordered are listed, but only abnormal results are displayed) Labs Reviewed  COMPREHENSIVE METABOLIC PANEL - Abnormal; Notable for the following components:      Result Value   Glucose, Bld 110 (*)    Creatinine, Ser 1.05 (*)    Calcium 8.0 (*)    Total Protein 5.8 (*)    Albumin 3.1 (*)    Alkaline Phosphatase 36 (*)    Total Bilirubin 1.4 (*)    GFR, Estimated 51 (*)    All other components within normal limits  CBC WITH DIFFERENTIAL/PLATELET - Abnormal; Notable for the following components:   Platelets 133 (*)    All other components within normal limits  PROTIME-INR - Abnormal; Notable for the following components:   Prothrombin Time 17.7 (*)    INR 1.5 (*)    All other components within normal limits  CULTURE, BLOOD (ROUTINE X 2)  CULTURE, BLOOD (ROUTINE X 2)  RESP PANEL BY RT-PCR (RSV, FLU A&B, COVID)  RVPGX2  APTT  LACTIC ACID, PLASMA  LACTIC ACID, PLASMA  URINALYSIS, ROUTINE W REFLEX MICROSCOPIC  ACETAMINOPHEN LEVEL  SALICYLATE LEVEL    EKG EKG Interpretation  Date/Time:  Wednesday September 29 2022 14:01:07 EDT Ventricular Rate:  69 PR Interval:    QRS Duration: 145 QT Interval:  445 QTC Calculation: 477 R Axis:   -85 Text Interpretation: Atrial fibrillation Right bundle branch block LVH with secondary repolarization abnormality Inferior infarct, old Anterior infarct, old No significant change since last  tracing Confirmed by Melene PlanFloyd, Elfriede Bonini [phone]([id number]) on 09/29/2022 2:12:09 PM  Radiology DG Chest Port 1 View  Result Date: 09/29/2022 CLINICAL DATA:  Possible sepsis.  Recently diagnosed with pneumonia. EXAM: PORTABLE CHEST 1 VIEW COMPARISON:  07/15/2022 FINDINGS: Patient rotated right. Numerous leads and wires project over the chest. Mild cardiomegaly, accentuated by AP portable technique. No pleural effusion or pneumothorax. No congestive failure. Mild left hemidiaphragm elevation with similar left base volume loss and scarring. Surgical clips project over the right lower chest. IMPRESSION: No evidence of pneumonia. Mild cardiomegaly, without congestive  failure. Left base scarring. Electronically Signed   By: Jeronimo Greaves M.D.   On: 09/29/2022 14:08    Procedures .Critical Care  Performed by: Melene Plan, DO Authorized by: Melene Plan, DO   Critical care provider statement:    Critical care time (minutes):  80   Critical care time was exclusive of:  Separately billable procedures and treating other patients   Critical care was time spent personally by me on the following activities:  Development of treatment plan with patient or surrogate, discussions with consultants, evaluation of patient's response to treatment, examination of patient, ordering and review of laboratory studies, ordering and review of radiographic studies, ordering and performing treatments and interventions, pulse oximetry, re-evaluation of patient's condition and review of old charts   Care discussed with: admitting provider       Medications Ordered in ED Medications  cefTRIAXone (ROCEPHIN) 2 g in sodium chloride 0.9 % 100 mL IVPB (2 g Intravenous New Bag/Given 09/29/22 1455)  azithromycin (ZITHROMAX) 500 mg in sodium chloride 0.9 % 250 mL IVPB (has no administration in time range)  lactated ringers bolus 86,125 mL (has no administration in time range)  lactated ringers bolus 1,000 mL (1,000 mLs Intravenous New Bag/Given 09/29/22  1433)    ED Course/ Medical Decision Making/ A&P                             Medical Decision Making Amount and/or Complexity of Data Reviewed Labs: ordered. Radiology: ordered. ECG/medicine tests: ordered.   89 yo F with a chief complaints of what sounds like a cough medicine overdose.  Reportedly was diagnosed with pneumonia, started on cough medicine which she got filled yesterday.  She has 100 mL of Phenergan DM cough syrup that is no longer in the bottle.  Will discuss with poison control.  As she reportedly has pneumonia we will get a chest x-ray blood work.  Bolus of IV fluids.  Reassess.  No leukocytosis, no significant change in renal function.  Blood pressure is improved with IV fluids.  Chest x-ray independently interpreted by me without focal traits or pneumothorax.  Will discuss with medicine for admission.  The patients results and plan were reviewed and discussed.   Any x-rays performed were independently reviewed by myself.   Differential diagnosis were considered with the presenting HPI.  Medications  cefTRIAXone (ROCEPHIN) 2 g in sodium chloride 0.9 % 100 mL IVPB (2 g Intravenous New Bag/Given 09/29/22 1455)  azithromycin (ZITHROMAX) 500 mg in sodium chloride 0.9 % 250 mL IVPB (has no administration in time range)  lactated ringers bolus 86,125 mL (has no administration in time range)  lactated ringers bolus 1,000 mL (1,000 mLs Intravenous New Bag/Given 09/29/22 1433)    There were no vitals filed for this visit.  Final diagnoses:  Anticholinergic drug overdose, accidental or unintentional, initial encounter    Admission/ observation were discussed with the admitting physician, patient and/or family and they are comfortable with the plan.          Final Clinical Impression(s) / ED Diagnoses Final diagnoses:  Anticholinergic drug overdose, accidental or unintentional, initial encounter    Rx / DC Orders ED Discharge Orders     None          Melene Plan, DO 09/29/22 1513

## 2022-09-29 NOTE — ED Provider Notes (Signed)
Signout from Dr. Adela Lank.  89 year old female here with altered mental status in the setting of possible diagnosis of pneumonia yesterday and started on promethazine cough syrup.  Apparently he has drank most of the bottle of cough syrup in the last 24 hours.  Blood pressure soft.  Patient is altered.  Hospitalist has been called for admission. Physical Exam  BP (!) 107/92   Pulse (!) 41   Resp 19   SpO2 94%   Physical Exam  Procedures  Procedures  ED Course / MDM    Medical Decision Making Amount and/or Complexity of Data Reviewed Labs: ordered. Radiology: ordered. ECG/medicine tests: ordered.  Risk Prescription drug management. Decision regarding hospitalization.   Patient being admitted to hospitalist service       Terrilee Files, MD 09/30/22 [phone]

## 2022-09-29 NOTE — Progress Notes (Signed)
Elink following for sepsis protocol. 

## 2022-09-29 NOTE — ED Triage Notes (Signed)
Pt BIB EMS from home. CC: Promethazine DM syrup OD, pt took 120 ml of med. Recently diagnosed with pneumonia.  EMS reports systolic bp was originally 48, 02: 83%. Cbg 124. Gave 250 ml saline

## 2022-09-29 NOTE — H&P (Addendum)
History and Physical    Patient: Natalie Mendoza DOB: 08-Nov-1932 DOA: 09/29/2022 DOS: the patient was seen and examined on 09/29/2022 PCP: Jarrett Soho, PA-C  Patient coming from: Home  Chief Complaint:  Chief Complaint  Patient presents with   Drug Overdose   HPI: Natalie Mendoza is a 89 y.o. female with medical history significant of chronic atrial fibrillation on Eliquis, HFpEF, history of breast cancer followed by [facility]-presented to the hospital with altered mental status.  Per history obtained from son, patient, ED staff-apparently for the past 2-3 days-patient has developed URI-like symptoms and had a fever of 102 F yesterday.  Patient was seen by her primary care practitioner-given a shot of Rocephin-and placed on cough syrup with Phenergan.  This morning-when patient's son went to her house-she he found very altered and groggy.  He noticed that the cough syrup with Phenerganand it-was almost empty.  It looks like-overnight-patient had multiple coughing spells-and inadvertently almost drank the whole bottle.  Patient who was subsequently brought to the ED-where ED MD spoke with poison control-supportive care was recommended.  Patient's patient's blood pressure was soft-she was still altered-[facility] was asked to admit this patient for further evaluation and treatment.  During my evaluation-patient's mental status was already improving-she was much more coherent and alert.  She denies any intentional drug overdose-and acknowledges that this was a unintentional event.  She does acknowledge productive cough with yellow/white phlegm. No diarrhea No vomiting No chest pain no abdominal pain.  Review of Systems: As mentioned in the history of present illness. All other systems reviewed and are negative. Past Medical History:  Diagnosis Date   Arrhythmia    Atrial Fib   Atrial fibrillation    failed cardioversion x 3 12/2013 at [facility]   Breast cancer    CHF  (congestive heart failure)    Chronic systolic CHF (congestive heart failure)    CKD (chronic kidney disease) stage 2, GFR 60-89 ml/min    Diabetes mellitus, type II    Gallstone    Hearing impairment    Hx of joint problems    Hypertension    Major depressive disorder    Osteoarthritis    Osteoporosis    Pelvic relaxation    Rheumatoid arthritis    Stroke    fully resolved TIA   Transient cerebral ischemic attack    Vaginal pessary present    Vitamin D insufficiency    Past Surgical History:  Procedure Laterality Date   BREAST LUMPECTOMY     CATARACT EXTRACTION W/ INTRAOCULAR LENS  IMPLANT, BILATERAL Bilateral    REPLACEMENT TOTAL KNEE     left   Social History:  reports that she has never smoked. She has never used smokeless tobacco. She reports that she does not drink alcohol and does not use drugs.  No Known Allergies  Family History  Problem Relation Age of Onset   Hypertension Mother    Stroke Mother    Ovarian cancer Maternal Grandmother    Liver disease Neg Hx    Esophageal cancer Neg Hx    Colon cancer Neg Hx     Prior to Admission medications   Medication Sig Start Date End Date Taking? Authorizing Provider  acetaminophen (TYLENOL) 325 MG tablet Take 2 tablets (650 mg total) by mouth every 6 (six) hours as needed for mild pain, fever or headache. Patient not taking: Reported on 09/24/2022 04/20/21   Rodolph Bong, MD  acetaminophen (TYLENOL) 650 MG CR tablet Take  1,300 mg by mouth every 8 (eight) hours as needed for pain. Patient not taking: Reported on 09/24/2022    [provider]  apixaban (ELIQUIS) 5 MG TABS tablet Take 1 tablet (5 mg total) by mouth 2 (two) times daily. 03/15/13   Laveda Normanti, Chris N, MD  bisoprolol (ZEBETA) 5 MG tablet Take 5 mg by mouth daily. 05/03/20   [provider]  blood glucose meter kit and supplies KIT Dispense based on patient and insurance preference. Use up to four times daily as directed. 05/30/21   Rhetta MuraSamtani,  Jai-Gurmukh, MD  blood glucose meter kit and supplies KIT Dispense based on patient and insurance preference. Use up to four times daily as directed. 05/30/21   Rhetta MuraSamtani, Jai-Gurmukh, MD  denosumab (PROLIA) 60 MG/ML SOSY injection Inject 60 mg into the skin every 6 (six) months. Patient not taking: Reported on 09/24/2022    [provider]  dexamethasone (DECADRON) 6 MG tablet Take 1 tablet (6 mg total) by mouth daily. Patient not taking: Reported on 08/04/2022 05/30/21   Rhetta MuraSamtani, Jai-Gurmukh, MD  diltiazem (CARDIZEM SR) 60 MG 12 hr capsule Take 1 capsule (60 mg total) by mouth every evening. 04/27/21   Rodolph Bonghompson, Daniel V, MD  DULoxetine (CYMBALTA) 30 MG capsule Take 30 mg by mouth at bedtime.    [provider]  glucose blood (FREESTYLE TEST STRIPS) test strip Please give generic test strips 60 for 1 month supply to be tested x 2 per day Patient not taking: Reported on 09/24/2022 05/30/21   Rhetta MuraSamtani, Jai-Gurmukh, MD  guaiFENesin-codeine 100-10 MG/5ML syrup Take 5 mLs by mouth every 6 (six) hours as needed for cough. Patient not taking: Reported on 09/24/2022 05/27/21   [provider]  Lancets (FREESTYLE) lancets Please give generic lancets 60 for 1 month supply to be tested x 2 per day Patient not taking: Reported on 08/04/2022 05/30/21   Rhetta MuraSamtani, Jai-Gurmukh, MD  melatonin 5 MG TABS Take 5 mg by mouth at bedtime.    [provider]  Polyethyl Glycol-Propyl Glycol (SYSTANE) 0.4-0.3 % GEL ophthalmic gel Place 1 application into both eyes 2 (two) times daily.    [provider]  spironolactone (ALDACTONE) 25 MG tablet  10/13/21   [provider]    Physical Exam: Vitals:   09/29/22 1500 09/29/22 1515 09/29/22 1530 09/29/22 1610  BP: 105/80 (!) 107/92  110/75  Pulse: 64 (!) 41  72  Resp: (!) 21 19  (!) 25  Temp:   97.9 F (36.6 C)   TempSrc:   Oral   SpO2: 96% 94%  91%   Gen Exam:not in any distress HEENT:atraumatic, normocephalic Chest: moving  air well-significant transmitted upper airway sounds-scattered rhonchi all over. CVS:S1S2 regular Abdomen:soft non tender, non distended Extremities:no edema Neurology: Non focal-much more awake and alert per son at bedside.  Still appears somewhat drowsy to me.  Although drowsy-following simple commands. Skin: no rash   Data Reviewed:     Latest Ref Rng & Units 09/29/2022    1:54 PM 05/30/2021    2:37 AM 05/29/2021    3:19 AM  CBC  WBC 4.0 - 10.5 K/uL 4.1  14.3  6.0   Hemoglobin 12.0 - 15.0 g/dL 16.114.9  09.615.0  04.514.1   Hematocrit 36.0 - 46.0 % 45.6  45.4  41.5   Platelets 150 - 400 K/uL 133  288  226         Latest Ref Rng & Units 09/29/2022    1:54 PM 05/30/2021  2:37 AM 05/29/2021    3:19 AM  BMP  Glucose 70 - 99 mg/dL 453  82  646   BUN 8 - 23 mg/dL 14  35  12   Creatinine 0.44 - 1.00 mg/dL 8.03  2.12  2.48   Sodium 135 - 145 mmol/L 136  135  134   Potassium 3.5 - 5.1 mmol/L 4.4  4.8  4.5   Chloride 98 - 111 mmol/L 101  97  98   CO2 22 - 32 mmol/L 22  26  25    Calcium 8.9 - 10.3 mg/dL 8.0  9.5  9.0      Assessment and Plan: Acute toxic encephalopathy Secondary to unintentional drug overdose-cough syrup with Phenergan Already improving with supportive care CT head negative for any significant abnormalities Suspect with supportive care-she will continue to improve-she will be evaluated with physical therapy tomorrow morning-and if she is stable-we could contemplate probable discharge tomorrow.  Borderline hypotension Likely secondary to drug ingestion Doubt sepsis at this point. Gentle hydration with IVF-holding all antihypertensives Follow closely-do not think antibiotics are needed at this point.  She has influenza and will be treated with Tamiflu.  Acute bronchitis-influenza A Starting Tamiflu-as URI symptoms ongoing for 2-3 days. Bronchodilators Incentive spirometry If still wheezing with above measures-will require short course of steroids  Chronic  atrial  fibrillation Rate controlled Since blood pressure is soft-holding beta-blocker/diltiazem-suspect this could be resumed tomorrow Resume Eliquis  HFpEF Holding beta-blocker/Aldactone-until blood pressure is a bit more stable.  Depression Holding Cymbalta until mentation improves further.   Advance Care Planning:   Code Status: DNR-reconfirmed with both son at bedside-and daughter (over the phone)  Consults: None  Family Communication: Son at bedside-daughter on the speaker phone.  Severity of Illness: The appropriate patient status for this patient is OBSERVATION. Observation status is judged to be reasonable and necessary in order to provide the required intensity of service to ensure the patient's safety. The patient's presenting symptoms, physical exam findings, and initial radiographic and laboratory data in the context of their medical condition is felt to place them at decreased risk for further clinical deterioration. Furthermore, it is anticipated that the patient will be medically stable for discharge from the hospital within 2 midnights of admission.   Author: Jeoffrey Massed, MD 09/29/2022 5:56 PM  For on call review [email or web address].

## 2022-09-29 NOTE — Telephone Encounter (Signed)
    Chief Complaint: Daughter reports pt. Drank 2/3 bottle of Promethazine cough syrup and is groggy.  Transferred to [employer].  Symptoms: Groggy Frequency: Today Pertinent Negatives: Patient denies  Disposition: [] ED /[] Urgent Care (no appt availability in office) / [] Appointment(In office/virtual)/ []  [facility]/ [] Home Care/ [] Refused Recommended Disposition /[]  Mobile Bus/ [x]  Follow-up with PCP Additional Notes:   Reason for Disposition  [1] DOUBLE DOSE (an extra dose or lesser amount) of prescription drug AND [2] any symptoms (e.g., dizziness, nausea, pain, sleepiness)  Answer Assessment - Initial Assessment Questions 1. NAME of MEDICINE: "What medicine(s) are you calling about?"     Promethazine 2. QUESTION: "What is your question?" (e.g., double dose of medicine, side effect)     Over dose 3. PRESCRIBER: "Who prescribed the medicine?" Reason: if prescribed by specialist, call should be referred to that group.     PCP 4. SYMPTOMS: "Do you have any symptoms?" If Yes, ask: "What symptoms are you having?"  "How bad are the symptoms (e.g., mild, moderate, severe)     gRoggy 5. PREGNANCY:  "Is there any chance that you are pregnant?" "When was your last menstrual period?"     No  Protocols used: Medication Question Call-A-AH

## 2022-09-30 DIAGNOSIS — M810 Age-related osteoporosis without current pathological fracture: Secondary | ICD-10-CM | POA: Diagnosis present

## 2022-09-30 DIAGNOSIS — Z853 Personal history of malignant neoplasm of breast: Secondary | ICD-10-CM | POA: Diagnosis not present

## 2022-09-30 DIAGNOSIS — J209 Acute bronchitis, unspecified: Secondary | ICD-10-CM | POA: Diagnosis present

## 2022-09-30 DIAGNOSIS — T426X1A Poisoning by other antiepileptic and sedative-hypnotic drugs, accidental (unintentional), initial encounter: Secondary | ICD-10-CM | POA: Diagnosis present

## 2022-09-30 DIAGNOSIS — R4182 Altered mental status, unspecified: Secondary | ICD-10-CM | POA: Diagnosis not present

## 2022-09-30 DIAGNOSIS — Z7901 Long term (current) use of anticoagulants: Secondary | ICD-10-CM | POA: Diagnosis not present

## 2022-09-30 DIAGNOSIS — M069 Rheumatoid arthritis, unspecified: Secondary | ICD-10-CM | POA: Diagnosis present

## 2022-09-30 DIAGNOSIS — Z1152 Encounter for screening for COVID-19: Secondary | ICD-10-CM | POA: Diagnosis not present

## 2022-09-30 DIAGNOSIS — T443X1A Poisoning by other parasympatholytics [anticholinergics and antimuscarinics] and spasmolytics, accidental (unintentional), initial encounter: Secondary | ICD-10-CM | POA: Diagnosis present

## 2022-09-30 DIAGNOSIS — Z8673 Personal history of transient ischemic attack (TIA), and cerebral infarction without residual deficits: Secondary | ICD-10-CM | POA: Diagnosis not present

## 2022-09-30 DIAGNOSIS — J1000 Influenza due to other identified influenza virus with unspecified type of pneumonia: Secondary | ICD-10-CM | POA: Diagnosis present

## 2022-09-30 DIAGNOSIS — I959 Hypotension, unspecified: Secondary | ICD-10-CM | POA: Diagnosis present

## 2022-09-30 DIAGNOSIS — H9190 Unspecified hearing loss, unspecified ear: Secondary | ICD-10-CM | POA: Diagnosis present

## 2022-09-30 DIAGNOSIS — E872 Acidosis, unspecified: Secondary | ICD-10-CM | POA: Diagnosis present

## 2022-09-30 DIAGNOSIS — Z961 Presence of intraocular lens: Secondary | ICD-10-CM | POA: Diagnosis present

## 2022-09-30 DIAGNOSIS — Z66 Do not resuscitate: Secondary | ICD-10-CM | POA: Diagnosis present

## 2022-09-30 DIAGNOSIS — I482 Chronic atrial fibrillation, unspecified: Secondary | ICD-10-CM | POA: Diagnosis present

## 2022-09-30 DIAGNOSIS — Y92009 Unspecified place in unspecified non-institutional (private) residence as the place of occurrence of the external cause: Secondary | ICD-10-CM | POA: Diagnosis not present

## 2022-09-30 DIAGNOSIS — E1122 Type 2 diabetes mellitus with diabetic chronic kidney disease: Secondary | ICD-10-CM | POA: Diagnosis present

## 2022-09-30 DIAGNOSIS — I5032 Chronic diastolic (congestive) heart failure: Secondary | ICD-10-CM | POA: Diagnosis present

## 2022-09-30 DIAGNOSIS — F329 Major depressive disorder, single episode, unspecified: Secondary | ICD-10-CM | POA: Diagnosis present

## 2022-09-30 DIAGNOSIS — G928 Other toxic encephalopathy: Secondary | ICD-10-CM | POA: Diagnosis present

## 2022-09-30 DIAGNOSIS — D72819 Decreased white blood cell count, unspecified: Secondary | ICD-10-CM | POA: Diagnosis present

## 2022-09-30 DIAGNOSIS — N182 Chronic kidney disease, stage 2 (mild): Secondary | ICD-10-CM | POA: Diagnosis present

## 2022-09-30 DIAGNOSIS — I130 Hypertensive heart and chronic kidney disease with heart failure and stage 1 through stage 4 chronic kidney disease, or unspecified chronic kidney disease: Secondary | ICD-10-CM | POA: Diagnosis present

## 2022-09-30 DIAGNOSIS — R5381 Other malaise: Secondary | ICD-10-CM | POA: Diagnosis present

## 2022-09-30 LAB — BASIC METABOLIC PANEL
Anion gap: 7 (ref 5–15)
BUN: 13 mg/dL (ref 8–23)
CO2: 25 mmol/L (ref 22–32)
Calcium: 7.8 mg/dL — ABNORMAL LOW (ref 8.9–10.3)
Chloride: 106 mmol/L (ref 98–111)
Creatinine, Ser: 0.76 mg/dL (ref 0.44–1.00)
GFR, Estimated: >60 mL/min (ref >60)
Glucose, Bld: 84 mg/dL (ref 70–99)
Potassium: 3.6 mmol/L (ref 3.5–5.1)
Sodium: 138 mmol/L (ref 135–145)

## 2022-09-30 LAB — CBC
HCT: 40.9 % (ref 36.0–46.0)
Hemoglobin: 13.4 g/dL (ref 12.0–15.0)
MCH: 32.2 pg (ref 26.0–34.0)
MCHC: 32.8 g/dL (ref 30.0–36.0)
MCV: 98.3 fL (ref 80.0–100.0)
Platelets: 147 10*3/uL — ABNORMAL LOW (ref 150–400)
RBC: 4.16 MIL/uL (ref 3.87–5.11)
RDW: 13.1 % (ref 11.5–15.5)
WBC: 3.2 10*3/uL — ABNORMAL LOW (ref 4.0–10.5)
nRBC: 0 % (ref 0.0–0.2)

## 2022-09-30 LAB — CULTURE, BLOOD (ROUTINE X 2)

## 2022-09-30 MED ORDER — MELATONIN 5 MG PO TABS
5.0000 mg | ORAL_TABLET | Freq: Every evening | ORAL | Status: DC | PRN
  Administered 2022-09-30: 5 mg via ORAL
  Filled 2022-09-30: qty 1

## 2022-09-30 MED ORDER — PREDNISONE 20 MG PO TABS
20.0000 mg | ORAL_TABLET | Freq: Every day | ORAL | Status: DC
  Administered 2022-09-30 – 2022-10-01 (×2): 20 mg via ORAL
  Filled 2022-09-30 (×2): qty 1

## 2022-09-30 MED ORDER — DULOXETINE HCL 30 MG PO CPEP
30.0000 mg | ORAL_CAPSULE | Freq: Every day | ORAL | Status: DC
  Administered 2022-09-30: 30 mg via ORAL
  Filled 2022-09-30: qty 1

## 2022-09-30 MED ORDER — DILTIAZEM HCL ER 60 MG PO CP12
60.0000 mg | ORAL_CAPSULE | Freq: Every evening | ORAL | Status: DC
  Administered 2022-09-30: 60 mg via ORAL
  Filled 2022-09-30 (×2): qty 1

## 2022-09-30 NOTE — Progress Notes (Signed)
PROGRESS NOTE Natalie Mendoza  KGM:[id number] DOB: 1932-09-30 DOA: 09/29/2022 PCP: Jarrett Soho, PA-C  Brief Narrative/Hospital Course: 89 y.o.f w/ chronic atrial fibrillation on Eliquis, HFpEF, history of breast cancer followed by [facility] oncology-presented to the hospital with altered mental status.Per history from son/patient/ED staff-apparently for the past 2-3 days-patient has developed URI-like symptoms and had a fever of 102 F 4/9 - seen by  PCP given a shot of Rocephin-and placed on cough syrup with Phenergan. On 4/10 am when patient's son went to her house-she he found very altered and groggy. He noticed that the cough syrup with Phenerganand it-was almost empty.  It looks like-overnight-patient had multiple coughing spells-and inadvertently almost drank the whole bottle. In ED - poison control informed supportive care was recommended.  Patient's patient's blood pressure was soft-she was still altered and admitted. In the ED possible influenza A came back positive-overall patient has been afebrile.  Patient had mild lactic acidosis and improved     Subjective: Patient seen and examined this morning daughter at the bedside he states her mentation is much better today pain gradually improving since this morning Overnight agitated after Haldol she was more agitated it seems   Assessment and Plan: Principal Problem:   Altered mental status, unspecified Active Problems:   HTN (hypertension)   Chronic atrial fibrillation   Major depressive disorder, single episode   Acute toxic encephalopathy: Multifactorial in the setting of unintentional open drug with cough syrup with Phenergan, also with acute viral illness.CT head negative for acute finding.  Overnight agitation but this morning mentation much better daughter at the bedside stating patient is gradually improving, continue PT OT evaluation delirium precaution fall precaution.  Requesting to resume her home meds   Borderline  hypotension: Currently BP has stabilized.  Encourage oral intake.  Holding bisoprolol, resume Cardizem later today.   Acute bronchitis-influenza A: Still wheezing having URI symptoms continue Tamiflu will add prednisone 20 mg, continue bronchodilators incentive spirometry.   Chronic  atrial fibrillation: Rate controlled continue her home Eliquis, resume her Cardizem    HFpEF: Currently euvolemic, resume Aldactone in the morning  Deconditioning/debility continue PT OT evaluation for disposition   DVT prophylaxis: eliquis Code Status:   Code Status: DNR Family Communication: plan of care discussed with patient/daughter at bedside. Patient status is: admitted as observation but remains hospitalized for ongoing  because of altered mental status shortness of breath wheezing Level of care: Telemetry Medical   Dispo: The patient is from: home            Anticipated disposition: TBD> anticipate next 24 hours Objective: Vitals last 24 hrs: Vitals:   09/30/22 0800 09/30/22 1022 09/30/22 1100 09/30/22 1120  BP: (!) 127/93 127/86  (!) 127/95  Pulse: 87 81 83 82  Resp: 20 (!) 21 19 (!) 21  Temp: 98.5 F (36.9 C)   97.6 F (36.4 C)  TempSrc: Oral   Oral  SpO2: 96% 98% 94% 94%  Weight:    72.1 kg  Height:    5\' 5"  (1.651 m)   Weight change:   Physical Examination:  General exam: alert awake, oriented to self place current month,, hard of hearing older than stated age HEENT:Oral mucosa moist, Ear/Nose WNL grossly Respiratory system: bilaterally wheezing, no use of accessory muscle Cardiovascular system: S1 & S2 +, No JVD. Gastrointestinal system: Abdomen soft,NT,ND, BS+ Nervous System:Alert, awake, moving extremities. Extremities: LE edema NEG,distal peripheral pulses palpable.  Skin: No rashes,no icterus. MSK: Normal muscle bulk,tone, power  Medications  reviewed:  Scheduled Meds:  apixaban  5 mg Oral BID   guaiFENesin  600 mg Oral BID   oseltamivir  30 mg Oral BID   predniSONE   20 mg Oral Q breakfast   Continuous Infusions:    Diet Order             Diet Heart Room service appropriate? Yes; Fluid consistency: Thin; Fluid restriction: 1500 mL Fluid  Diet effective now                  No intake or output data in the 24 hours ending 09/30/22 1142 Net IO Since Admission: No IO data has been entered for this period [09/30/22 1142]  Wt Readings from Last 3 Encounters:  09/30/22 72.1 kg  09/24/22 68.9 kg  08/04/22 68.9 kg     Unresulted Labs (From admission, onward)    None     Data Reviewed: I have personally reviewed following labs and imaging studies CBC: Recent Labs  Lab 09/29/22 1354 09/30/22 0451  WBC 4.1 3.2*  NEUTROABS 2.5  --   HGB 14.9 13.4  HCT 45.6 40.9  MCV 98.9 98.3  PLT 133* 147*   Basic Metabolic Panel: Recent Labs  Lab 09/29/22 1354 09/30/22 0451  NA 136 138  K 4.4 3.6  CL 101 106  CO2 22 25  GLUCOSE 110* 84  BUN 14 13  CREATININE 1.05* 0.76  CALCIUM 8.0* 7.8*   GFR: Estimated Creatinine Clearance: 49.3 mL/min (by C-G formula based on SCr of 0.76 mg/dL). Liver Function Tests: Recent Labs  Lab 09/29/22 1354  AST 35  ALT 18  ALKPHOS 36*  BILITOT 1.4*  PROT 5.8*  ALBUMIN 3.1*   Recent Labs  Lab 09/29/22 1354  INR 1.5*  Sepsis Labs: Recent Labs  Lab 09/29/22 1354 09/29/22 1859  LATICACIDVEN 2.9* 1.7    Recent Results (from the past 240 hour(s))  Blood Culture (routine x 2)     Status: None (Preliminary result)   Collection Time: 09/29/22  1:54 PM   Specimen: BLOOD  Result Value Ref Range Status   Specimen Description BLOOD BLOOD RIGHT HAND  Final   Special Requests   Final    BOTTLES DRAWN AEROBIC AND ANAEROBIC Blood Culture results may not be optimal due to an inadequate volume of blood received in culture bottles   Culture   Final    NO GROWTH < 12 HOURS Performed at [facility], [address] [place], [place] [address]    Report Status PENDING  Incomplete  Resp panel by RT-PCR  (RSV, Flu A&B, Covid) Anterior Nasal Swab     Status: Abnormal   Collection Time: 09/29/22  1:56 PM   Specimen: Anterior Nasal Swab  Result Value Ref Range Status   SARS Coronavirus 2 by RT PCR NEGATIVE NEGATIVE Final   Influenza A by PCR POSITIVE (A) NEGATIVE Final   Influenza B by PCR NEGATIVE NEGATIVE Final    Comment: (NOTE) The Xpert Xpress SARS-CoV-2/FLU/RSV plus assay is intended as an aid in the diagnosis of influenza from Nasopharyngeal swab specimens and should not be used as a sole basis for treatment. Nasal washings and aspirates are unacceptable for Xpert Xpress SARS-CoV-2/FLU/RSV testing.  Fact Sheet for Patients: BloggerCourse.com  Fact Sheet for Healthcare Providers: SeriousBroker.it  This test is not yet approved or cleared by the [place] FDA and has been authorized for detection and/or diagnosis of SARS-CoV-2 by FDA under an Emergency Use Authorization (EUA). This EUA  will remain in effect (meaning this test can be used) for the duration of the COVID-19 declaration under Section 564(b)(1) of the Act, 21 U.S.C. section 360bbb-3(b)(1), unless the authorization is terminated or revoked.     Resp Syncytial Virus by PCR NEGATIVE NEGATIVE Final    Comment: (NOTE) Fact Sheet for Patients: BloggerCourse.comhttps://www.fda.gov/media/152166/download  Fact Sheet for Healthcare Providers: SeriousBroker.ithttps://www.fda.gov/media/152162/download  This test is not yet approved or cleared by the [place] FDA and has been authorized for detection and/or diagnosis of SARS-CoV-2 by FDA under an Emergency Use Authorization (EUA). This EUA will remain in effect (meaning this test can be used) for the duration of the COVID-19 declaration under Section 564(b)(1) of the Act, 21 U.S.C. section 360bbb-3(b)(1), unless the authorization is terminated or revoked.  Performed at [facility], [address] [place], [place] [address]   Blood  Culture (routine x 2)     Status: None (Preliminary result)   Collection Time: 09/29/22  1:59 PM   Specimen: BLOOD  Result Value Ref Range Status   Specimen Description BLOOD LEFT ANTECUBITAL  Final   Special Requests   Final    BOTTLES DRAWN AEROBIC AND ANAEROBIC Blood Culture adequate volume   Culture   Final    NO GROWTH < 12 HOURS Performed at [facility], [address] [place], [place] [address]    Report Status PENDING  Incomplete    Antimicrobials: Anti-infectives (From admission, onward)    Start     Dose/Rate Route Frequency Ordered Stop   09/30/22 1000  oseltamivir (TAMIFLU) capsule 30 mg       See Hyperspace for full Linked Orders Report.   30 mg Oral 2 times daily 09/29/22 1807 10/05/22 0959   09/29/22 2200  oseltamivir (TAMIFLU) capsule 75 mg  Status:  Discontinued        75 mg Oral 2 times daily 09/29/22 1754 09/29/22 1805   09/29/22 1815  oseltamivir (TAMIFLU) capsule 75 mg       See Hyperspace for full Linked Orders Report.   75 mg Oral  Once 09/29/22 1807 09/29/22 1915   09/29/22 1415  cefTRIAXone (ROCEPHIN) 2 g in sodium chloride 0.9 % 100 mL IVPB        2 g 200 mL/hr over 30 Minutes Intravenous  Once 09/29/22 1407 09/29/22 1531   09/29/22 1415  azithromycin (ZITHROMAX) 500 mg in sodium chloride 0.9 % 250 mL IVPB        500 mg 250 mL/hr over 60 Minutes Intravenous  Once 09/29/22 1407 09/29/22 1707      Culture/Microbiology    Component Value Date/Time   SDES BLOOD LEFT ANTECUBITAL 09/29/2022 1359   SPECREQUEST  09/29/2022 1359    BOTTLES DRAWN AEROBIC AND ANAEROBIC Blood Culture adequate volume   CULT  09/29/2022 1359    NO GROWTH < 12 HOURS Performed at [facility], [address] [place], [place] [address]    REPTSTATUS PENDING 09/29/2022 1359  Other culture-see note  Radiology Studies: CT ABDOMEN PELVIS W CONTRAST  Result Date: 09/29/2022 CLINICAL DATA:  89 year old female with acute abdominal and pelvic pain. EXAM: CT ABDOMEN AND  PELVIS WITH CONTRAST TECHNIQUE: Multidetector CT imaging of the abdomen and pelvis was performed using the standard protocol following bolus administration of intravenous contrast. RADIATION DOSE REDUCTION: This exam was performed according to the departmental dose-optimization program which includes automated exposure control, adjustment of the mA and/or kV according to patient size and/or use of iterative reconstruction  technique. CONTRAST:  70mL OMNIPAQUE IOHEXOL 350 MG/ML SOLN COMPARISON:  01/02/2010 CT FINDINGS: Lower chest: No acute abnormality.  Cardiomegaly identified. Hepatobiliary: No significant hepatic abnormalities are identified. Cholelithiasis noted without definite evidence of acute cholecystitis. There is no evidence of intrahepatic or extrahepatic biliary dilatation. Pancreas: Unremarkable Spleen: Unremarkable Adrenals/Urinary Tract: The kidneys, adrenal glands and bladder are unremarkable except for a stable 3.2 cm LEFT renal angiomyolipoma. Stomach/Bowel: Stomach is within normal limits. Appendix appears normal. No evidence of bowel wall thickening, distention, or inflammatory changes. Colonic diverticulosis identified without evidence of acute diverticulitis. Vascular/Lymphatic: Aortic atherosclerosis. No enlarged abdominal or pelvic lymph nodes. Reproductive: No significant abnormality. Other: No ascites, focal collection or pneumoperitoneum. Musculoskeletal: No acute or suspicious bony abnormalities are noted. Degenerative changes in the LOWER lumbar spine and grade 1 spondylolisthesis at L4-L5 again noted. IMPRESSION: 1. No evidence of acute abnormality. 2. Cholelithiasis without definite evidence of acute cholecystitis. Consider ultrasound as clinically indicated. 3. Stable 3.2 cm LEFT renal angiomyolipoma. 4. Cardiomegaly and aortic Atherosclerosis (ICD10-I70.0). Electronically Signed   By: Harmon Pier M.D.   On: 09/29/2022 17:17   CT Head Wo Contrast  Result Date: 09/29/2022 CLINICAL  DATA:  Delirium EXAM: CT HEAD WITHOUT CONTRAST TECHNIQUE: Contiguous axial images were obtained from the base of the skull through the vertex without intravenous contrast. RADIATION DOSE REDUCTION: This exam was performed according to the departmental dose-optimization program which includes automated exposure control, adjustment of the mA and/or kV according to patient size and/or use of iterative reconstruction technique. COMPARISON:  CT head 04/16/21 FINDINGS: Brain: Negative for an acute infarct. No hydrocephalus. No extra-axial fluid collection. Redemonstrated is a chronic infarct in the right parietal lobe and in the right opercular region. Redemonstrated is an extra-axial lesion along the left frontal lobe measuring 8 mm, unchanged compared to 04/16/2021, and favored to represent a meningioma. Sequela of moderate chronic microvascular ischemic change. Vascular: No hyperdense vessel or unexpected calcification. Skull: Normal. Negative for fracture or focal lesion. Sinuses/Orbits: No middle ear or mastoid effusion. Paranasal sinuses are notable for trace mucosal thickening in the bilateral maxillary sinuses. Bilateral lens replacement. Orbits are otherwise unremarkable. Other: None. IMPRESSION: 1. No acute intracranial process. 2. Unchanged 8 mm extra-axial lesion along the left frontal lobe, favored to represent a meningioma. 3. Redemonstrated sequela of chronic microvascular ischemic change with chronic infarcts in the right frontal opercular region and in the right parietal lobe. Electronically Signed   By: Lorenza Cambridge M.D.   On: 09/29/2022 17:09   DG Chest Port 1 View  Result Date: 09/29/2022 CLINICAL DATA:  Possible sepsis.  Recently diagnosed with pneumonia. EXAM: PORTABLE CHEST 1 VIEW COMPARISON:  07/15/2022 FINDINGS: Patient rotated right. Numerous leads and wires project over the chest. Mild cardiomegaly, accentuated by AP portable technique. No pleural effusion or pneumothorax. No congestive  failure. Mild left hemidiaphragm elevation with similar left base volume loss and scarring. Surgical clips project over the right lower chest. IMPRESSION: No evidence of pneumonia. Mild cardiomegaly, without congestive failure. Left base scarring. Electronically Signed   By: Jeronimo Greaves M.D.   On: 09/29/2022 14:08     LOS: 0 days   Lanae Boast, MD [facility]  09/30/2022, 11:42 AM

## 2022-09-30 NOTE — ED Notes (Signed)
Oriented to self.  Obeys commands.  Placed on bedpan per request.   350cc Clear yellow urine.  Pericare performed.  Repositioned in bed.  Bed alarm in place.

## 2022-09-30 NOTE — Evaluation (Signed)
Physical Therapy Evaluation Patient Details Name: Natalie Mendoza MRN: [id number] DOB: 22-Sep-1932 Today's Date: 09/30/2022  History of Present Illness  Natalie Mendoza is a 89 y.o. female who presented to the hospital with AMS. The past 2-3 days-patient has developed URI-like symptoms and had a fever of 102. Patient was seen by her PCP and given a shot of Rocephin and a script for cough syrup with Phenergan. This morning when patient's son went to her house-she he found very altered and groggy and noticed that the bottle of cough syrup with Phenergan was almost empty. Pt +FLU. PMH: chronic atrial fibrillation on Eliquis, HFpEF, history of breast cancer followed by [facility] oncology, TLKA, hearing impairment, depression.   Clinical Impression  Pt admitted with above. Pt was independent living on 2nd floor of house with roommate on the first level. Pt was walking 1.5 miles with a small dog, driving, and doing grocery shopping. Pt now presenting with mild confusion, deconditioning, and DOE with mobility. Pt found to be +FLU. Pt with noted DOE/SOB and SpO2 of 88% on RA s/kp 140' of ambulation however suspect this to be due to the flu. Per daughter, MD is going to give patient a steroid to improve cough. Daughter aware and concurs that patient to benefit from 24/7 assist the first 24-48 hours. Acute PT to continue to follow pt in hospital to monitor O2 and activity tolerance. Suspect pt will not need PT services upon d/c as once her flu symptoms improve her mobility/activity tolerance will also improve.      Recommendations for follow up therapy are one component of a multi-disciplinary discharge planning process, led by the attending physician.  Recommendations may be updated based on patient status, additional functional criteria and insurance authorization.  Follow Up Recommendations       Assistance Recommended at Discharge Frequent or constant Supervision/Assistance (24/7 for initial couple of days)   Patient can return home with the following  A little help with walking and/or transfers;A little help with bathing/dressing/bathroom;Assistance with cooking/housework;Assist for transportation;Help with stairs or ramp for entrance    Equipment Recommendations None recommended by PT  Recommendations for Other Services       Functional Status Assessment Patient has had a recent decline in their functional status and demonstrates the ability to make significant improvements in function in a reasonable and predictable amount of time.     Precautions / Restrictions Precautions Precautions: Fall;Other (comment) Precaution Comments: monitor O2 (88% at rest today sitting EOB), up to 91% on 3 liters Restrictions Weight Bearing Restrictions: No      Mobility  Bed Mobility Overal bed mobility: Independent             General bed mobility comments: min guard due to safety as gurney height very elevated    Transfers Overall transfer level: Needs assistance Equipment used: 1 person hand held assist Transfers: Sit to/from Stand Sit to Stand: Min assist           General transfer comment: minA to safely lower off edge of gurney due to elevated height    Ambulation/Gait Ambulation/Gait assistance: Min guard Gait Distance (Feet): 140 Feet Assistive device: None Gait Pattern/deviations: Step-through pattern, Decreased stride length, Drifts right/left Gait velocity: dec Gait velocity interpretation: <1.31 ft/sec, indicative of household ambulator   General Gait Details: pt's SpO2 >88% on RA, noted SOB, HR in 120s, no epsidoes of LOB however pt with wide base of support and increased lateral sway t/o ambulation  Stairs  Wheelchair Mobility    Modified Rankin (Stroke Patients Only)       Balance Overall balance assessment: Mild deficits observed, not formally tested                                           Pertinent Vitals/Pain Pain  Assessment Pain Assessment: No/denies pain    Home Living Family/patient expects to be discharged to:: Private residence Living Arrangements: Alone (has a female roomate who lives on first floor of home) Available Help at Discharge: Family;Neighbor;Available PRN/intermittently Type of Home: House Home Access: Stairs to enter   [employer] CorporationEntrance Stairs-Number of Steps: 2 with a grab bar Alternate Level Stairs-Number of Steps: steps with one rail--landing-steps with 2 rails (can reach both) Home Layout: Multi-level;Bed/bath upstairs Home Equipment: Shower seat - built in;Grab bars - toilet;Grab bars - tub/shower;Hand held shower head;Cane - single point;Rolling Walker (2 wheels) Additional Comments: Has a female friend that lives downstairs in her house, they often have meals together. Dtr lives nearby and son is presently in town.    Prior Function Prior Level of Function : Independent/Modified Independent;Driving                     Hand Dominance   Dominant Hand: Right    Extremity/Trunk Assessment   Upper Extremity Assessment Upper Extremity Assessment: Overall WFL for tasks assessed    Lower Extremity Assessment Lower Extremity Assessment: Overall WFL for tasks assessed    Cervical / Trunk Assessment Cervical / Trunk Assessment: Normal  Communication   Communication: No difficulties  Cognition Arousal/Alertness: Awake/alert Behavior During Therapy: WFL for tasks assessed/performed Overall Cognitive Status: Impaired/Different from baseline                                 General Comments: pt with delayed response time however suspect it's due to pt being [facility]        General Comments General comments (skin integrity, edema, etc.): SpO2 >88% on RA    Exercises     Assessment/Plan    PT Assessment Patient needs continued PT services  PT Problem List Decreased strength;Decreased activity tolerance;Decreased mobility;Cardiopulmonary status limiting  activity       PT Treatment Interventions DME instruction;Gait training;Stair training;Functional mobility training;Therapeutic activities;Therapeutic exercise;Balance training    PT Goals (Current goals can be found in the Care Plan section)  Acute Rehab PT Goals Patient Stated Goal: improve breathing PT Goal Formulation: With patient/family Time For Goal Achievement: 10/15/22 Potential to Achieve Goals: Good    Frequency Min 2X/week     Co-evaluation               AM-PAC PT "6 Clicks" Mobility  Outcome Measure Help needed turning from your back to your side while in a flat bed without using bedrails?: A Little Help needed moving from lying on your back to sitting on the side of a flat bed without using bedrails?: A Little Help needed moving to and from a bed to a chair (including a wheelchair)?: A Little Help needed standing up from a chair using your arms (e.g., wheelchair or bedside chair)?: A Little Help needed to walk in hospital room?: A Little Help needed climbing 3-5 steps with a railing? : A Little 6 Click Score: 18    End of Session Equipment Utilized During  Treatment: Gait belt Activity Tolerance: Patient tolerated treatment well Patient left: in bed;with call bell/phone within reach;with family/visitor present (daughter present) Nurse Communication: Mobility status PT Visit Diagnosis: Unsteadiness on feet (R26.81);Difficulty in walking, not elsewhere classified (R26.2)    Time: 7106-2694 PT Time Calculation (min) (ACUTE ONLY): 22 min   Charges:   PT Evaluation $PT Eval Moderate Complexity: 1 Mod          Lewis Shock, PT, DPT Acute Rehabilitation Services Secure chat preferred Office #: [phone]   Iona Hansen 09/30/2022, 1:07 PM

## 2022-09-30 NOTE — Evaluation (Signed)
Occupational Therapy Evaluation Patient Details Name: Natalie Mendoza MRN: [id number] DOB: 1933/05/24 Today's Date: 09/30/2022   History of Present Illness Natalie Mendoza is a 89 y.o. female with medical history significant of chronic atrial fibrillation on Eliquis, HFpEF, history of breast cancer followed by [facility]-presented to the hospital with altered mental status. The past 2-3 days-patient has developed URI-like symptoms and had a fever of 102.  Patient was seen by her primary care practitioner-given a shot of Rocephin-and placed on cough syrup with Phenergan.  This morning-when patient's son went to her house-she he found very altered and groggy.  He noticed that the cough syrup with Phenerganand it-was almost empty.  It looks like-overnight-patient had multiple coughing spells-and inadvertently almost drank the whole bottle. PHMx: TLKA, hearing impairment, depression.   Clinical Impression   This 89 yo female admitted with above presents to acute OT with PLOF of living on her own, doing her own basic ADLs, IADLs, driving. Currently she is still a bit confused from her baseline, but dtr in room reports she can see her mom trending back towards her baseline. Dtr verbalizes understanding that I am recommending someone be with her mom for 24-48 hours after discharge to make sure she if functioning at her baseline and is safe. No further OT needs, we will sign off.      Recommendations for follow up therapy are one component of a multi-disciplinary discharge planning process, led by the attending physician.  Recommendations may be updated based on patient status, additional functional criteria and insurance authorization.   Assistance Recommended at Discharge Frequent or constant Supervision/Assistance  Patient can return home with the following A little help with walking and/or transfers;A little help with bathing/dressing/bathroom;Help with stairs or ramp for entrance;Assist for  transportation;Assistance with cooking/housework;Direct supervision/assist for financial management;Direct supervision/assist for medications management (spoke with dtr and recommended that someone be with patient all the time for at least the first 24-48 hours she is back home do to make sure she is back to baseline and functioning safely in her home. Recommended she not drive.)    Functional Status Assessment  Patient has had a recent decline in their functional status and demonstrates the ability to make significant improvements in function in a reasonable and predictable amount of time. (no further skilled OT needs)  Equipment Recommendations  None recommended by OT       Precautions / Restrictions Precautions Precaution Comments: monitor O2 (88% at rest today sitting EOB), up to 91% on 3 liters Restrictions Weight Bearing Restrictions: No      Mobility Bed Mobility Overal bed mobility: Independent                  Transfers Overall transfer level: Needs assistance                 General transfer comment: overall at a S level      Balance Overall balance assessment: Mild deficits observed, not formally tested                                         ADL either performed or assessed with clinical judgement   ADL  General ADL Comments: overall at a S level due to current reason for hospitalization (confusion)     Vision Patient Visual Report: No change from baseline              Pertinent Vitals/Pain Pain Assessment Pain Assessment: No/denies pain     Hand Dominance Right   Extremity/Trunk Assessment Upper Extremity Assessment Upper Extremity Assessment: Overall WFL for tasks assessed           Communication Communication Communication: No difficulties   Cognition Arousal/Alertness: Awake/alert Behavior During Therapy:  (giddy) Overall Cognitive Status:  Impaired/Different from baseline Area of Impairment: Orientation                 Orientation Level: Disoriented to, Situation, Time (2025, "it's not April is it", could not tell me why she was in hospital)                              Home Living Family/patient expects to be discharged to:: Private residence Living Arrangements: Alone Available Help at Discharge: Family;Neighbor;Available PRN/intermittently Type of Home: House Home Access: Stairs to enter [employer] of Steps: 2 with a grab bar   Home Layout: Multi-level;Bed/bath upstairs Alternate Level Stairs-Number of Steps: steps with one rail--landing-steps with 2 rails (can reach both)   Bathroom Shower/Tub: Door;Walk-in Human resources officer: Standard     Home Equipment: Shower seat - built in;Grab bars - toilet;Grab bars - tub/shower;Hand held shower head;Cane - single point;Rolling Walker (2 wheels)   Additional Comments: Has a female friend that lives downstairs in her house, they often have meals together. Dtr lives nearby and son is presently in town.      Prior Functioning/Environment Prior Level of Function : Independent/Modified Independent;Driving                        OT Problem List: Cardiopulmonary status limiting activity;Decreased cognition         OT Goals(Current goals can be found in the care plan section) Acute Rehab OT Goals Patient Stated Goal: to go home         AM-PAC OT "6 Clicks" Daily Activity     Outcome Measure Help from another person eating meals?: None Help from another person taking care of personal grooming?: A Little Help from another person toileting, which includes using toliet, bedpan, or urinal?: A Little Help from another person bathing (including washing, rinsing, drying)?: A Little Help from another person to put on and taking off regular upper body clothing?: A Little Help from another person to put on and taking off regular lower  body clothing?: A Little 6 Click Score: 19   End of Session Equipment Utilized During Treatment: Gait belt Nurse Communication:  (sats down to 88% on RA--now on 3 liters with sats as high as 91%)  Activity Tolerance: Patient tolerated treatment well Patient left: in bed;with call bell/phone within reach  OT Visit Diagnosis: Other symptoms and signs involving cognitive function                Time: 1219-7588 OT Time Calculation (min): 30 min Charges:  OT General Charges $OT Visit: 1 Visit OT Evaluation $OT Eval Moderate Complexity: 1 Mod OT Treatments $Self Care/Home Management : 8-22 mins  Lindon Romp OT Acute Rehabilitation Services Office [phone]    Evette Georges 09/30/2022, 11:43 AM

## 2022-09-30 NOTE — ED Notes (Signed)
ED TO INPATIENT HANDOFF REPORT  ED Nurse Name and Phone #: Martavis Gurney  [phone]  S Name/Age/Gender Natalie Mendoza 89 y.o. female Room/Bed: 039C/039C  Code Status   Code Status: DNR  Home/SNF/Other Home Patient oriented to: self and place Is this baseline? No   Triage Complete: Triage complete  Chief Complaint Altered mental status, unspecified [R41.82]  Triage Note Pt BIB EMS from home. CC: Promethazine DM syrup OD, pt took 120 ml of med. Recently diagnosed with pneumonia.  EMS reports systolic bp was originally 48, 02: 83%. Cbg 124. Gave 250 ml saline     Allergies No Known Allergies  Level of Care/Admitting Diagnosis ED Disposition     ED Disposition  Admit   Condition  --   Comment  Hospital Area: [facility] [[id number]]  Level of Care: Telemetry Medical [104]  May place patient in observation at [facility] or [facility] if equivalent level of care is available:: No  Covid Evaluation: Asymptomatic - no recent exposure (last 10 days) testing not required  Diagnosis: Altered mental status, unspecified [1478295]  Admitting Physician: Maretta Bees [[id number]]  Attending Physician: Maretta Bees [[id number]]  Bed request comments: Prefer 5 west          B Medical/Surgery History Past Medical History:  Diagnosis Date   Arrhythmia    Atrial Fib   Atrial fibrillation    failed cardioversion x 3 12/2013 at [facility]   Breast cancer    CHF (congestive heart failure)    Chronic systolic CHF (congestive heart failure)    CKD (chronic kidney disease) stage 2, GFR 60-89 ml/min    Diabetes mellitus, type II    Gallstone    Hearing impairment    Hx of joint problems    Hypertension    Major depressive disorder    Osteoarthritis    Osteoporosis    Pelvic relaxation    Rheumatoid arthritis    Stroke    fully resolved TIA   Transient cerebral ischemic attack    Vaginal pessary present    Vitamin D insufficiency    Past Surgical History:   Procedure Laterality Date   BREAST LUMPECTOMY     CATARACT EXTRACTION W/ INTRAOCULAR LENS  IMPLANT, BILATERAL Bilateral    REPLACEMENT TOTAL KNEE     left     A IV Location/Drains/Wounds Patient Lines/Drains/Airways Status     Active Line/Drains/Airways     Name Placement date Placement time Site Days   Peripheral IV 09/29/22 18 G Left Antecubital 09/29/22  1358  Antecubital  1   Peripheral IV 09/29/22 20 G Anterior;Left Hand 09/29/22  1544  Hand  1            Intake/Output Last 24 hours No intake or output data in the 24 hours ending 09/30/22 1024  Labs/Imaging Results for orders placed or performed during the hospital encounter of 09/29/22 (from the past 48 hour(s))  Lactic acid, plasma     Status: Abnormal   Collection Time: 09/29/22  1:54 PM  Result Value Ref Range   Lactic Acid, Venous 2.9 (HH) 0.5 - 1.9 mmol/L    Comment: CRITICAL RESULT CALLED TO, READ BACK BY AND VERIFIED WITH GASQUE,S RN @ [phone] 09/29/22 LEONARD,A Performed at [facility], [address] [place], [place] [address]   Comprehensive metabolic panel     Status: Abnormal   Collection Time: 09/29/22  1:54 PM  Result Value Ref Range   Sodium 136 135 -  145 mmol/L   Potassium 4.4 3.5 - 5.1 mmol/L    Comment: HEMOLYSIS AT THIS LEVEL MAY AFFECT RESULT   Chloride 101 98 - 111 mmol/L   CO2 22 22 - 32 mmol/L   Glucose, Bld 110 (H) 70 - 99 mg/dL    Comment: Glucose reference range applies only to samples taken after fasting for at least 8 hours.   BUN 14 8 - 23 mg/dL   Creatinine, Ser 1.79 (H) 0.44 - 1.00 mg/dL   Calcium 8.0 (L) 8.9 - 10.3 mg/dL   Total Protein 5.8 (L) 6.5 - 8.1 g/dL   Albumin 3.1 (L) 3.5 - 5.0 g/dL   AST 35 15 - 41 U/L    Comment: HEMOLYSIS AT THIS LEVEL MAY AFFECT RESULT   ALT 18 0 - 44 U/L    Comment: HEMOLYSIS AT THIS LEVEL MAY AFFECT RESULT   Alkaline Phosphatase 36 (L) 38 - 126 U/L   Total Bilirubin 1.4 (H) 0.3 - 1.2 mg/dL    Comment: HEMOLYSIS AT THIS LEVEL MAY AFFECT  RESULT   GFR, Estimated 51 (L) >60 mL/min    Comment: (NOTE) Calculated using the CKD-EPI Creatinine Equation (2021)    Anion gap 13 5 - 15    Comment: Performed at [facility], [address], [place], [place] [address]  CBC with Differential     Status: Abnormal   Collection Time: 09/29/22  1:54 PM  Result Value Ref Range   WBC 4.1 4.0 - 10.5 K/uL   RBC 4.61 3.87 - 5.11 MIL/uL   Hemoglobin 14.9 12.0 - 15.0 g/dL   HCT 97.9 48.0 - 16.5 %   MCV 98.9 80.0 - 100.0 fL   MCH 32.3 26.0 - 34.0 pg   MCHC 32.7 30.0 - 36.0 g/dL   RDW 53.7 48.2 - 70.7 %   Platelets 133 (L) 150 - 400 K/uL    Comment: REPEATED TO VERIFY   nRBC 0.0 0.0 - 0.2 %   Neutrophils Relative % 58 %   Neutro Abs 2.5 1.7 - 7.7 K/uL   Lymphocytes Relative 27 %   Lymphs Abs 1.1 0.7 - 4.0 K/uL   Monocytes Relative 12 %   Monocytes Absolute 0.5 0.1 - 1.0 K/uL   Eosinophils Relative 1 %   Eosinophils Absolute 0.0 0.0 - 0.5 K/uL   Basophils Relative 1 %   Basophils Absolute 0.0 0.0 - 0.1 K/uL   Immature Granulocytes 1 %   Abs Immature Granulocytes 0.02 0.00 - 0.07 K/uL    Comment: Performed at [facility], [address], [place], [place] [address]  Protime-INR     Status: Abnormal   Collection Time: 09/29/22  1:54 PM  Result Value Ref Range   Prothrombin Time 17.7 (H) 11.4 - 15.2 seconds   INR 1.5 (H) 0.8 - 1.2    Comment: (NOTE) INR goal varies based on device and disease states. Performed at [facility], [address] [place], [place] [address]   APTT     Status: None   Collection Time: 09/29/22  1:54 PM  Result Value Ref Range   aPTT 32 24 - 36 seconds    Comment: Performed at [facility], [address], [place], [place] [address]  Blood Culture (routine x 2)     Status: None (Preliminary result)   Collection Time: 09/29/22  1:54 PM   Specimen: BLOOD  Result Value Ref Range   Specimen Description BLOOD BLOOD RIGHT HAND  Special Requests      BOTTLES DRAWN AEROBIC AND  ANAEROBIC Blood Culture results may not be optimal due to an inadequate volume of blood received in culture bottles   Culture      NO GROWTH < 12 HOURS Performed at [facility], [address] [place], [place] [address]    Report Status PENDING   Resp panel by RT-PCR (RSV, Flu A&B, Covid) Anterior Nasal Swab     Status: Abnormal   Collection Time: 09/29/22  1:56 PM   Specimen: Anterior Nasal Swab  Result Value Ref Range   SARS Coronavirus 2 by RT PCR NEGATIVE NEGATIVE   Influenza A by PCR POSITIVE (A) NEGATIVE   Influenza B by PCR NEGATIVE NEGATIVE    Comment: (NOTE) The Xpert Xpress SARS-CoV-2/FLU/RSV plus assay is intended as an aid in the diagnosis of influenza from Nasopharyngeal swab specimens and should not be used as a sole basis for treatment. Nasal washings and aspirates are unacceptable for Xpert Xpress SARS-CoV-2/FLU/RSV testing.  Fact Sheet for Patients: BloggerCourse.comhttps://www.fda.gov/media/152166/download  Fact Sheet for Healthcare Providers: SeriousBroker.ithttps://www.fda.gov/media/152162/download  This test is not yet approved or cleared by the [place] FDA and has been authorized for detection and/or diagnosis of SARS-CoV-2 by FDA under an Emergency Use Authorization (EUA). This EUA will remain in effect (meaning this test can be used) for the duration of the COVID-19 declaration under Section 564(b)(1) of the Act, 21 U.S.C. section 360bbb-3(b)(1), unless the authorization is terminated or revoked.     Resp Syncytial Virus by PCR NEGATIVE NEGATIVE    Comment: (NOTE) Fact Sheet for Patients: BloggerCourse.comhttps://www.fda.gov/media/152166/download  Fact Sheet for Healthcare Providers: SeriousBroker.ithttps://www.fda.gov/media/152162/download  This test is not yet approved or cleared by the [place] FDA and has been authorized for detection and/or diagnosis of SARS-CoV-2 by FDA under an Emergency Use Authorization (EUA). This EUA will remain in effect (meaning this test can be used) for the  duration of the COVID-19 declaration under Section 564(b)(1) of the Act, 21 U.S.C. section 360bbb-3(b)(1), unless the authorization is terminated or revoked.  Performed at [facility], [address] [place], [place] [address]   Blood Culture (routine x 2)     Status: None (Preliminary result)   Collection Time: 09/29/22  1:59 PM   Specimen: BLOOD  Result Value Ref Range   Specimen Description BLOOD LEFT ANTECUBITAL    Special Requests      BOTTLES DRAWN AEROBIC AND ANAEROBIC Blood Culture adequate volume   Culture      NO GROWTH < 12 HOURS Performed at [facility], [address] [place], [place] [address]    Report Status PENDING   Lactic acid, plasma     Status: None   Collection Time: 09/29/22  6:59 PM  Result Value Ref Range   Lactic Acid, Venous 1.7 0.5 - 1.9 mmol/L    Comment: Performed at [facility], [address] [place], [place] [address]  CBC     Status: Abnormal   Collection Time: 09/30/22  4:51 AM  Result Value Ref Range   WBC 3.2 (L) 4.0 - 10.5 K/uL   RBC 4.16 3.87 - 5.11 MIL/uL   Hemoglobin 13.4 12.0 - 15.0 g/dL   HCT 29.540.9 62.136.0 - 30.846.0 %   MCV 98.3 80.0 - 100.0 fL   MCH 32.2 26.0 - 34.0 pg   MCHC 32.8 30.0 - 36.0 g/dL   RDW 65.713.1 84.611.5 - 96.215.5 %   Platelets 147 (L) 150 -  400 K/uL   nRBC 0.0 0.0 - 0.2 %    Comment: Performed at [facility], [address], [place], [place] [address]  Basic metabolic panel     Status: Abnormal   Collection Time: 09/30/22  4:51 AM  Result Value Ref Range   Sodium 138 135 - 145 mmol/L   Potassium 3.6 3.5 - 5.1 mmol/L   Chloride 106 98 - 111 mmol/L   CO2 25 22 - 32 mmol/L   Glucose, Bld 84 70 - 99 mg/dL    Comment: Glucose reference range applies only to samples taken after fasting for at least 8 hours.   BUN 13 8 - 23 mg/dL   Creatinine, Ser 6.04 0.44 - 1.00 mg/dL   Calcium 7.8 (L) 8.9 - 10.3 mg/dL   GFR, Estimated >54 >09 mL/min    Comment: (NOTE) Calculated using the CKD-EPI Creatinine  Equation (2021)    Anion gap 7 5 - 15    Comment: Performed at [facility], [address] [place], [place] [address]   CT ABDOMEN PELVIS W CONTRAST  Result Date: 09/29/2022 CLINICAL DATA:  89 year old female with acute abdominal and pelvic pain. EXAM: CT ABDOMEN AND PELVIS WITH CONTRAST TECHNIQUE: Multidetector CT imaging of the abdomen and pelvis was performed using the standard protocol following bolus administration of intravenous contrast. RADIATION DOSE REDUCTION: This exam was performed according to the departmental dose-optimization program which includes automated exposure control, adjustment of the mA and/or kV according to patient size and/or use of iterative reconstruction technique. CONTRAST:  70mL OMNIPAQUE IOHEXOL 350 MG/ML SOLN COMPARISON:  01/02/2010 CT FINDINGS: Lower chest: No acute abnormality.  Cardiomegaly identified. Hepatobiliary: No significant hepatic abnormalities are identified. Cholelithiasis noted without definite evidence of acute cholecystitis. There is no evidence of intrahepatic or extrahepatic biliary dilatation. Pancreas: Unremarkable Spleen: Unremarkable Adrenals/Urinary Tract: The kidneys, adrenal glands and bladder are unremarkable except for a stable 3.2 cm LEFT renal angiomyolipoma. Stomach/Bowel: Stomach is within normal limits. Appendix appears normal. No evidence of bowel wall thickening, distention, or inflammatory changes. Colonic diverticulosis identified without evidence of acute diverticulitis. Vascular/Lymphatic: Aortic atherosclerosis. No enlarged abdominal or pelvic lymph nodes. Reproductive: No significant abnormality. Other: No ascites, focal collection or pneumoperitoneum. Musculoskeletal: No acute or suspicious bony abnormalities are noted. Degenerative changes in the LOWER lumbar spine and grade 1 spondylolisthesis at L4-L5 again noted. IMPRESSION: 1. No evidence of acute abnormality. 2. Cholelithiasis without definite evidence of acute  cholecystitis. Consider ultrasound as clinically indicated. 3. Stable 3.2 cm LEFT renal angiomyolipoma. 4. Cardiomegaly and aortic Atherosclerosis (ICD10-I70.0). Electronically Signed   By: Harmon Pier M.D.   On: 09/29/2022 17:17   CT Head Wo Contrast  Result Date: 09/29/2022 CLINICAL DATA:  Delirium EXAM: CT HEAD WITHOUT CONTRAST TECHNIQUE: Contiguous axial images were obtained from the base of the skull through the vertex without intravenous contrast. RADIATION DOSE REDUCTION: This exam was performed according to the departmental dose-optimization program which includes automated exposure control, adjustment of the mA and/or kV according to patient size and/or use of iterative reconstruction technique. COMPARISON:  CT head 04/16/21 FINDINGS: Brain: Negative for an acute infarct. No hydrocephalus. No extra-axial fluid collection. Redemonstrated is a chronic infarct in the right parietal lobe and in the right opercular region. Redemonstrated is an extra-axial lesion along the left frontal lobe measuring 8 mm, unchanged compared to 04/16/2021, and favored to represent a meningioma. Sequela of moderate chronic microvascular ischemic change. Vascular: No hyperdense vessel or unexpected calcification. Skull: Normal. Negative  for fracture or focal lesion. Sinuses/Orbits: No middle ear or mastoid effusion. Paranasal sinuses are notable for trace mucosal thickening in the bilateral maxillary sinuses. Bilateral lens replacement. Orbits are otherwise unremarkable. Other: None. IMPRESSION: 1. No acute intracranial process. 2. Unchanged 8 mm extra-axial lesion along the left frontal lobe, favored to represent a meningioma. 3. Redemonstrated sequela of chronic microvascular ischemic change with chronic infarcts in the right frontal opercular region and in the right parietal lobe. Electronically Signed   By: Lorenza Cambridge M.D.   On: 09/29/2022 17:09   DG Chest Port 1 View  Result Date: 09/29/2022 CLINICAL DATA:  Possible  sepsis.  Recently diagnosed with pneumonia. EXAM: PORTABLE CHEST 1 VIEW COMPARISON:  07/15/2022 FINDINGS: Patient rotated right. Numerous leads and wires project over the chest. Mild cardiomegaly, accentuated by AP portable technique. No pleural effusion or pneumothorax. No congestive failure. Mild left hemidiaphragm elevation with similar left base volume loss and scarring. Surgical clips project over the right lower chest. IMPRESSION: No evidence of pneumonia. Mild cardiomegaly, without congestive failure. Left base scarring. Electronically Signed   By: Jeronimo Greaves M.D.   On: 09/29/2022 14:08    Pending Labs Unresulted Labs (From admission, onward)    None       Vitals/Pain Today's Vitals   09/30/22 0700 09/30/22 0715 09/30/22 0800 09/30/22 0805  BP: 125/79 (!) 125/96 (!) 127/93   Pulse: 76 78 87   Resp: (!) 22 15 20    Temp:   98.5 F (36.9 C)   TempSrc:   Oral   SpO2: 95% 95% 96%   PainSc:  0-No pain  0-No pain    Isolation Precautions No active isolations  Medications Medications  apixaban (ELIQUIS) tablet 5 mg (5 mg Oral Given 09/30/22 1018)  acetaminophen (TYLENOL) tablet 650 mg (has no administration in time range)    Or  acetaminophen (TYLENOL) suppository 650 mg (has no administration in time range)  ondansetron (ZOFRAN) tablet 4 mg (has no administration in time range)    Or  ondansetron (ZOFRAN) injection 4 mg (has no administration in time range)  polyethylene glycol (MIRALAX / GLYCOLAX) packet 17 g (has no administration in time range)  albuterol (PROVENTIL) (2.5 MG/3ML) 0.083% nebulizer solution 2.5 mg (has no administration in time range)  guaiFENesin (MUCINEX) 12 hr tablet 600 mg (600 mg Oral Given 09/30/22 1018)  0.9 %  sodium chloride infusion (0 mLs Intravenous Stopped 09/30/22 0436)  oseltamivir (TAMIFLU) capsule 75 mg (75 mg Oral Given 09/29/22 1915)    Followed by  oseltamivir (TAMIFLU) capsule 30 mg (30 mg Oral Given 09/30/22 1018)  lactated ringers bolus  1,000 mL (0 mLs Intravenous Stopped 09/29/22 1531)  cefTRIAXone (ROCEPHIN) 2 g in sodium chloride 0.9 % 100 mL IVPB (0 g Intravenous Stopped 09/29/22 1531)  azithromycin (ZITHROMAX) 500 mg in sodium chloride 0.9 % 250 mL IVPB (0 mg Intravenous Stopped 09/29/22 1707)  lactated ringers bolus 1,250 mL (0 mLs Intravenous Stopped 09/29/22 1800)  iohexol (OMNIPAQUE) 350 MG/ML injection 70 mL (70 mLs Intravenous Contrast Given 09/29/22 1656)  haloperidol lactate (HALDOL) injection 1 mg (1 mg Intravenous Given 09/29/22 2308)    Mobility walks     Focused Assessments Neuro Assessment Handoff:  Swallow screen pass? Yes  Cardiac Rhythm: Atrial fibrillation       Neuro Assessment:   Neuro Checks:      Has TPA been given? No If patient is a Neuro Trauma and patient is going to OR before floor call report to  4N Charge nurse: [phone] or [phone]   R Recommendations: See Admitting Provider Note  Report given to:   Additional Notes:  awake alert with some confusion and inappropriateness.  Forgetful.  PT walked her, oxygen sat dropped to upper 80's on RA.  Flu positive.    She had sundowners type behavior during the night.  Increased confusion, pulling leads and monitors.  Pilled out IV.  Was given haldol but it didn't help at all.   She follows commands.    Can use bsc or bedpan

## 2022-09-30 NOTE — Hospital Course (Addendum)
89 y.o.f w/ chronic atrial fibrillation on Eliquis, HFpEF, history of breast cancer followed by [facility] oncology-presented to the hospital with altered mental status.Per history from son/patient/ED staff-apparently for the past 2-3 days-patient has developed URI-like symptoms and had a fever of 102 F 4/9 - seen by  PCP given a shot of Rocephin-and placed on cough syrup with Phenergan. On 4/10 am when patient's son went to her house-she he found very altered and groggy. He noticed that the cough syrup with Phenerganand it-was almost empty.  It looks like-overnight-patient had multiple coughing spells-and inadvertently almost drank the whole bottle. In ED - poison control informed supportive care was recommended.  Patient's patient's blood pressure was soft-she was still altered and admitted. In the ED possible influenza A came back positive-overall patient has been afebrile.  Patient had mild lactic acidosis and improved

## 2022-09-30 NOTE — Care Management Obs Status (Signed)
MEDICARE OBSERVATION STATUS NOTIFICATION   Patient Details  Name: Natalie Mendoza MRN: [id number] Date of Birth: 07-18-1932   Medicare Observation Status Notification Given:  Yes    Gordy Clement, RN 09/30/2022, 1:30 PM

## 2022-10-01 DIAGNOSIS — R4182 Altered mental status, unspecified: Secondary | ICD-10-CM | POA: Diagnosis not present

## 2022-10-01 LAB — CULTURE, BLOOD (ROUTINE X 2)

## 2022-10-01 MED ORDER — OSELTAMIVIR PHOSPHATE 30 MG PO CAPS: 30.0000 mg | ORAL_CAPSULE | Freq: Two times a day (BID) | ORAL | 0 refills | Status: AC

## 2022-10-01 MED ORDER — BISOPROLOL FUMARATE 5 MG PO TABS
5.0000 mg | ORAL_TABLET | Freq: Every day | ORAL | Status: DC
  Filled 2022-10-01: qty 1

## 2022-10-01 MED ORDER — GUAIFENESIN ER 600 MG PO TB12: 600.0000 mg | ORAL_TABLET | Freq: Two times a day (BID) | ORAL | 0 refills | Status: AC

## 2022-10-01 MED ORDER — PREDNISONE 20 MG PO TABS: 20.0000 mg | ORAL_TABLET | Freq: Every day | ORAL | 0 refills | Status: AC

## 2022-10-01 NOTE — Progress Notes (Signed)
Physical Therapy Treatment Patient Details Name: Natalie Mendoza MRN: [id number] DOB: 1932-07-08 Today's Date: 10/01/2022   History of Present Illness Natalie Mendoza is a 89 y.o. female who presented to the hospital with AMS. The past 2-3 days-patient has developed URI-like symptoms and had a fever of 102. Patient was seen by her PCP and given a shot of Rocephin and a script for cough syrup with Phenergan. This morning-when patient's son went to her house-she he found very altered and groggy and noticed that the bottle of cough syrup with Phenergan was almost empty. PMH: chronic atrial fibrillation on Eliquis, HFpEF, history of breast cancer followed by [facility] oncology, TLKA, hearing impairment, depression.    PT Comments    Pt tolerates treatment well, denying SOB during session when mobilizing on room air. Pt oxygen saturation with questionably reliable readings during session. When readings appear reliable sats range from 88-94%. Pt is able to neogitate stairs with supervision at this time. PT will continue to follow.   Recommendations for follow up therapy are one component of a multi-disciplinary discharge planning process, led by the attending physician.  Recommendations may be updated based on patient status, additional functional criteria and insurance authorization.  Follow Up Recommendations       Assistance Recommended at Discharge Frequent or constant Supervision/Assistance  Patient can return home with the following A little help with walking and/or transfers;A little help with bathing/dressing/bathroom;Assistance with cooking/housework;Assist for transportation;Help with stairs or ramp for entrance   Equipment Recommendations  None recommended by PT    Recommendations for Other Services       Precautions / Restrictions Precautions Precautions: Fall;Other (comment) Precaution Comments: monitor sats Restrictions Weight Bearing Restrictions: No     Mobility  Bed  Mobility                    Transfers Overall transfer level: Needs assistance Equipment used: None Transfers: Sit to/from Stand Sit to Stand: Supervision                Ambulation/Gait Ambulation/Gait assistance: Supervision Gait Distance (Feet): 400 Feet Assistive device: None Gait Pattern/deviations: Step-through pattern Gait velocity: functional Gait velocity interpretation: 1.31 - 2.62 ft/sec, indicative of limited community ambulator   General Gait Details: steady step-through gait   Stairs Stairs: Yes Stairs assistance: Supervision Stair Management: One rail Left, Alternating pattern Number of Stairs: 8     Wheelchair Mobility    Modified Rankin (Stroke Patients Only)       Balance Overall balance assessment: Needs assistance Sitting-balance support: No upper extremity supported, Feet supported Sitting balance-Leahy Scale: Good     Standing balance support: No upper extremity supported, During functional activity Standing balance-Leahy Scale: Good                              Cognition Arousal/Alertness: Awake/alert Behavior During Therapy: WFL for tasks assessed/performed Overall Cognitive Status: Impaired/Different from baseline Area of Impairment: Orientation, Memory, Awareness                 Orientation Level: Disoriented to, Time, Situation   Memory: Decreased short-term memory     Awareness: Emergent            Exercises      General Comments General comments (skin integrity, edema, etc.): sats on room air 90-91%, sats with ambulation are variable. Reliability of pleth reading is often questionable with pulse rate vastly different from HR  monitor. When pleth reading appears reliable pt sats from 88-94%. Pt denies SOB when mobilizing. Pt is tachycardic when mobilizing, ranging from 120-144      Pertinent Vitals/Pain Pain Assessment Pain Assessment: No/denies pain    Home Living                           Prior Function            PT Goals (current goals can now be found in the care plan section) Acute Rehab PT Goals Patient Stated Goal: improve breathing Progress towards PT goals: Progressing toward goals    Frequency    Min 2X/week      PT Plan Current plan remains appropriate    Co-evaluation              AM-PAC PT "6 Clicks" Mobility   Outcome Measure  Help needed turning from your back to your side while in a flat bed without using bedrails?: None Help needed moving from lying on your back to sitting on the side of a flat bed without using bedrails?: None Help needed moving to and from a bed to a chair (including a wheelchair)?: A Little Help needed standing up from a chair using your arms (e.g., wheelchair or bedside chair)?: A Little Help needed to walk in hospital room?: A Little Help needed climbing 3-5 steps with a railing? : A Little 6 Click Score: 20    End of Session   Activity Tolerance: Patient tolerated treatment well Patient left: in chair;with call bell/phone within reach;with chair alarm set;with family/visitor present Nurse Communication: Mobility status PT Visit Diagnosis: Unsteadiness on feet (R26.81);Difficulty in walking, not elsewhere classified (R26.2)     Time: 4270-6237 PT Time Calculation (min) (ACUTE ONLY): 18 min  Charges:  $Gait Training: 8-22 mins                     Arlyss Gandy, PT, DPT Acute Rehabilitation Office [phone]    Arlyss Gandy 10/01/2022, 12:38 PM

## 2022-10-01 NOTE — Progress Notes (Deleted)
PROGRESS NOTE Natalie Mendoza  GMW:[id number] DOB: [birth year] DOA: 09/29/2022 PCP: Jarrett Soho, PA-C  Brief Narrative/Hospital Course: 89 y.o.f w/ chronic atrial fibrillation on Eliquis, HFpEF, history of breast cancer followed by [facility]-presented to the hospital with altered mental status.Per history from son/patient/ED staff-apparently for the past 2-3 days-patient has developed URI-like symptoms and had a fever of 102 F 4/9 - seen by  PCP given a shot of Rocephin-and placed on cough syrup with Phenergan. On 4/10 am when patient's son went to her house-she he found very altered and groggy. He noticed that the cough syrup with Phenerganand it-was almost empty.  It looks like-overnight-patient had multiple coughing spells-and inadvertently almost drank the whole bottle. In ED - poison control informed supportive care was recommended.  Patient's patient's blood pressure was soft-she was still altered and admitted. In the ED possible influenza A came back positive-overall patient has been afebrile.  Patient had mild lactic acidosis and improved     Subjective: Seen and examined, Overnight vitals/labs/events reviewed  She is alert awake oriented to self, able to say president name w/ prompt,not oriented to place She is not sure why she is here, does not think she is in the hospital She is bit hard of hearing  She wants to get up and walk around in the room, needed to be in restraint overnight Has some cough, on 2l [place]   Assessment and Plan: Principal Problem:   Altered mental status, unspecified Active Problems:   HTN (hypertension)   Chronic atrial fibrillation   Major depressive disorder, single episode   AMS (altered mental status)  Acute toxic encephalopathy: Multifactorial in the setting of unintentional drug use with cough syrup with Phenergan, also with acute viral illness.CT head negative for acute finding.  Fairly alert awake but still confused, continue TeleSitter,  needing belt restraint.  Requesting PT OT evaluation today, continue delirium precaution fall precaution.   Borderline hypotension: Currently BP stable.Encourage oral intake.Holding bisoprolol cont Cardizem   Acute bronchitis-influenza A: wheezing is much improved after restarting oral steroid, continue Tamiflu, bronchodilators and S/P meds as needed.  Mobilize OOB  Chronic  atrial fibrillation: Rate controlled on Eliquis and Cardizem.  Resume bisoprolol soon   HFpEF: Currently euvolemic,cont Aldactone  Deconditioning/debility continue PT OT eval   DVT prophylaxis: eliquis Code Status:   Code Status: DNR Family Communication: plan of care discussed with patient/daughter at bedside 4/11, no family at bedside today. Patient status is: Inpatient due to ongoing mental status changes   Level of care: Telemetry Medical  Dispo: The patient is from: home            Anticipated disposition: TBD 1-2 days Objective: Vitals last 24 hrs: Vitals:   09/30/22 1700 09/30/22 1949 10/01/22 0014 10/01/22 0448  BP: (!) 144/109 (!) 135/100 135/78 (!) 141/97  Pulse: 94 80 70 64  Resp: (!) Temp: 97.6 F (36.4 C) 97.6 F (36.4 C)  97.7 F (36.5 C)  TempSrc: Oral Oral Oral Oral  SpO2: 96% 95% 97% 97%  Weight:      Height:       Weight change:   Physical Examination: General exam: Aa0x1-2, confused, weak,older appearing HEENT:Oral mucosa moist, Ear/Nose WNL grossly, dentition normal. Respiratory system: bilaterally clear BS, no use of accessory muscle Cardiovascular system: S1 & S2 +, regular rate,. Gastrointestinal system: Abdomen soft, NT,ND,BS+ Nervous System:Alert, awake, moving extremities and grossly nonfocal Extremities: LE ankle edema neg, lower extremities warm Skin: No rashes,no icterus.  MSK: Normal muscle bulk,tone, power   Medications reviewed:  Scheduled Meds:  apixaban  5 mg Oral BID   diltiazem  60 mg Oral QPM   DULoxetine  30 mg Oral QHS   guaiFENesin  600 mg  Oral BID   oseltamivir  30 mg Oral BID   predniSONE  20 mg Oral Q breakfast   Continuous Infusions:    Diet Order             Diet Heart Room service appropriate? Yes; Fluid consistency: Thin; Fluid restriction: 1500 mL Fluid  Diet effective now                  No intake or output data in the 24 hours ending 10/01/22 0735 Net IO Since Admission: No IO data has been entered for this period [10/01/22 0735]  Wt Readings from Last 3 Encounters:  09/30/22 72.1 kg  09/24/22 68.9 kg  08/04/22 68.9 kg     Unresulted Labs (From admission, onward)    None     Data Reviewed: I have personally reviewed following labs and imaging studies CBC: Recent Labs  Lab 09/29/22 1354 09/30/22 0451  WBC 4.1 3.2*  NEUTROABS 2.5  --   HGB 14.9 13.4  HCT 45.6 40.9  MCV 98.9 98.3  PLT 133* 147*    Basic Metabolic Panel: Recent Labs  Lab 09/29/22 1354 09/30/22 0451  NA 136 138  K 4.4 3.6  CL 101 106  CO2 22 25  GLUCOSE 110* 84  BUN 14 13  CREATININE 1.05* 0.76  CALCIUM 8.0* 7.8*    GFR: Estimated Creatinine Clearance: 49.3 mL/min (by C-G formula based on SCr of 0.76 mg/dL). Liver Function Tests: Recent Labs  Lab 09/29/22 1354  AST 35  ALT 18  ALKPHOS 36*  BILITOT 1.4*  PROT 5.8*  ALBUMIN 3.1*    Recent Labs  Lab 09/29/22 1354  INR 1.5*   Sepsis Labs: Recent Labs  Lab 09/29/22 1354 09/29/22 1859  LATICACIDVEN 2.9* 1.7     Recent Results (from the past 240 hour(s))  Blood Culture (routine x 2)     Status: None (Preliminary result)   Collection Time: 09/29/22  1:54 PM   Specimen: BLOOD  Result Value Ref Range Status   Specimen Description BLOOD BLOOD RIGHT HAND  Final   Special Requests   Final    BOTTLES DRAWN AEROBIC AND ANAEROBIC Blood Culture results may not be optimal due to an inadequate volume of blood received in culture bottles   Culture   Final    NO GROWTH 2 DAYS Performed at [facility], [address] [place], [place] [address]     Report Status PENDING  Incomplete  Resp panel by RT-PCR (RSV, Flu A&B, Covid) Anterior Nasal Swab     Status: Abnormal   Collection Time: 09/29/22  1:56 PM   Specimen: Anterior Nasal Swab  Result Value Ref Range Status   SARS Coronavirus 2 by RT PCR NEGATIVE NEGATIVE Final   Influenza A by PCR POSITIVE (A) NEGATIVE Final   Influenza B by PCR NEGATIVE NEGATIVE Final    Comment: (NOTE) The Xpert Xpress SARS-CoV-2/FLU/RSV plus assay is intended as an aid in the diagnosis of influenza from Nasopharyngeal swab specimens and should not be used as a sole basis for treatment. Nasal washings and aspirates are unacceptable for Xpert Xpress SARS-CoV-2/FLU/RSV testing.  Fact Sheet for Patients: BloggerCourse.com  Fact Sheet for Healthcare Providers: SeriousBroker.it  This test is not yet  approved or cleared by the [place] and has been authorized for detection and/or diagnosis of SARS-CoV-2 by FDA under an Emergency Use Authorization (EUA). This EUA will remain in effect (meaning this test can be used) for the duration of the COVID-19 declaration under Section 564(b)(1) of the Act, 21 U.S.C. section 360bbb-3(b)(1), unless the authorization is terminated or revoked.     Resp Syncytial Virus by PCR NEGATIVE NEGATIVE Final    Comment: (NOTE) Fact Sheet for Patients: BloggerCourse.com  Fact Sheet for Healthcare Providers: SeriousBroker.it  This test is not yet approved or cleared by the [place] FDA and has been authorized for detection and/or diagnosis of SARS-CoV-2 by FDA under an Emergency Use Authorization (EUA). This EUA will remain in effect (meaning this test can be used) for the duration of the COVID-19 declaration under Section 564(b)(1) of the Act, 21 U.S.C. section 360bbb-3(b)(1), unless the authorization is terminated or revoked.  Performed at [facility], [address] [place], [place] [address]   Blood Culture (routine x 2)     Status: None (Preliminary result)   Collection Time: 09/29/22  1:59 PM   Specimen: BLOOD  Result Value Ref Range Status   Specimen Description BLOOD LEFT ANTECUBITAL  Final   Special Requests   Final    BOTTLES DRAWN AEROBIC AND ANAEROBIC Blood Culture adequate volume   Culture   Final    NO GROWTH 2 DAYS Performed at [facility], [address], [place], [place] [address]    Report Status PENDING  Incomplete    Antimicrobials: Anti-infectives (From admission, onward)    Start     Dose/Rate Route Frequency Ordered Stop   09/30/22 1000  oseltamivir (TAMIFLU) capsule 30 mg       See Hyperspace for full Linked Orders Report.   30 mg Oral 2 times daily 09/29/22 1807 10/05/22 0959   09/29/22 2200  oseltamivir (TAMIFLU) capsule 75 mg  Status:  Discontinued        75 mg Oral 2 times daily 09/29/22 1754 09/29/22 1805   09/29/22 1815  oseltamivir (TAMIFLU) capsule 75 mg       See Hyperspace for full Linked Orders Report.   75 mg Oral  Once 09/29/22 1807 09/29/22 1915   09/29/22 1415  cefTRIAXone (ROCEPHIN) 2 g in sodium chloride 0.9 % 100 mL IVPB        2 g 200 mL/hr over 30 Minutes Intravenous  Once 09/29/22 1407 09/29/22 1531   09/29/22 1415  azithromycin (ZITHROMAX) 500 mg in sodium chloride 0.9 % 250 mL IVPB        500 mg 250 mL/hr over 60 Minutes Intravenous  Once 09/29/22 1407 09/29/22 1707      Culture/Microbiology    Component Value Date/Time   SDES BLOOD LEFT ANTECUBITAL 09/29/2022 1359   SPECREQUEST  09/29/2022 1359    BOTTLES DRAWN AEROBIC AND ANAEROBIC Blood Culture adequate volume   CULT  09/29/2022 1359    NO GROWTH 2 DAYS Performed at [facility], [address], [place], [place] [address]    REPTSTATUS PENDING 09/29/2022 1359  Other culture-see note  Radiology Studies: CT ABDOMEN PELVIS W CONTRAST  Result Date: 09/29/2022 CLINICAL DATA:  89 year old female  with acute abdominal and pelvic pain. EXAM: CT ABDOMEN AND PELVIS WITH CONTRAST TECHNIQUE: Multidetector CT imaging of the abdomen and pelvis was performed using the standard protocol following bolus administration of intravenous contrast. RADIATION DOSE REDUCTION: This exam was performed  according to the departmental dose-optimization program which includes automated exposure control, adjustment of the mA and/or kV according to patient size and/or use of iterative reconstruction technique. CONTRAST:  38mL OMNIPAQUE IOHEXOL 350 MG/ML SOLN COMPARISON:  01/02/2010 CT FINDINGS: Lower chest: No acute abnormality.  Cardiomegaly identified. Hepatobiliary: No significant hepatic abnormalities are identified. Cholelithiasis noted without definite evidence of acute cholecystitis. There is no evidence of intrahepatic or extrahepatic biliary dilatation. Pancreas: Unremarkable Spleen: Unremarkable Adrenals/Urinary Tract: The kidneys, adrenal glands and bladder are unremarkable except for a stable 3.2 cm LEFT renal angiomyolipoma. Stomach/Bowel: Stomach is within normal limits. Appendix appears normal. No evidence of bowel wall thickening, distention, or inflammatory changes. Colonic diverticulosis identified without evidence of acute diverticulitis. Vascular/Lymphatic: Aortic atherosclerosis. No enlarged abdominal or pelvic lymph nodes. Reproductive: No significant abnormality. Other: No ascites, focal collection or pneumoperitoneum. Musculoskeletal: No acute or suspicious bony abnormalities are noted. Degenerative changes in the LOWER lumbar spine and grade 1 spondylolisthesis at L4-L5 again noted. IMPRESSION: 1. No evidence of acute abnormality. 2. Cholelithiasis without definite evidence of acute cholecystitis. Consider ultrasound as clinically indicated. 3. Stable 3.2 cm LEFT renal angiomyolipoma. 4. Cardiomegaly and aortic Atherosclerosis (ICD10-I70.0). Electronically Signed   By: Harmon Pier M.D.   On: 09/29/2022 17:17    CT Head Wo Contrast  Result Date: 09/29/2022 CLINICAL DATA:  Delirium EXAM: CT HEAD WITHOUT CONTRAST TECHNIQUE: Contiguous axial images were obtained from the base of the skull through the vertex without intravenous contrast. RADIATION DOSE REDUCTION: This exam was performed according to the departmental dose-optimization program which includes automated exposure control, adjustment of the mA and/or kV according to patient size and/or use of iterative reconstruction technique. COMPARISON:  CT head 04/16/21 FINDINGS: Brain: Negative for an acute infarct. No hydrocephalus. No extra-axial fluid collection. Redemonstrated is a chronic infarct in the right parietal lobe and in the right opercular region. Redemonstrated is an extra-axial lesion along the left frontal lobe measuring 8 mm, unchanged compared to 04/16/2021, and favored to represent a meningioma. Sequela of moderate chronic microvascular ischemic change. Vascular: No hyperdense vessel or unexpected calcification. Skull: Normal. Negative for fracture or focal lesion. Sinuses/Orbits: No middle ear or mastoid effusion. Paranasal sinuses are notable for trace mucosal thickening in the bilateral maxillary sinuses. Bilateral lens replacement. Orbits are otherwise unremarkable. Other: None. IMPRESSION: 1. No acute intracranial process. 2. Unchanged 8 mm extra-axial lesion along the left frontal lobe, favored to represent a meningioma. 3. Redemonstrated sequela of chronic microvascular ischemic change with chronic infarcts in the right frontal opercular region and in the right parietal lobe. Electronically Signed   By: Lorenza Cambridge M.D.   On: 09/29/2022 17:09   DG Chest Port 1 View  Result Date: 09/29/2022 CLINICAL DATA:  Possible sepsis.  Recently diagnosed with pneumonia. EXAM: PORTABLE CHEST 1 VIEW COMPARISON:  07/15/2022 FINDINGS: Patient rotated right. Numerous leads and wires project over the chest. Mild cardiomegaly, accentuated by AP portable  technique. No pleural effusion or pneumothorax. No congestive failure. Mild left hemidiaphragm elevation with similar left base volume loss and scarring. Surgical clips project over the right lower chest. IMPRESSION: No evidence of pneumonia. Mild cardiomegaly, without congestive failure. Left base scarring. Electronically Signed   By: Jeronimo Greaves M.D.   On: 09/29/2022 14:08     LOS: 1 day   Lanae Boast, MD [facility]  10/01/2022, 7:35 AM

## 2022-10-01 NOTE — Discharge Summary (Signed)
Physician Discharge Summary  Natalie Mendoza ZOX:[id number] DOB: [birth year] DOA: 09/29/2022  PCP: Jarrett Soho, PA-C  Admit date: 09/29/2022 Discharge date: 10/01/2022 Recommendations for Outpatient Follow-up:  Follow up with PCP in 1 weeks-call for appointment  Discharge Dispo: hOME Discharge Condition: Stable Code Status:   Code Status: DNR Diet recommendation:  Diet Order             Diet Heart Room service appropriate? Yes; Fluid consistency: Thin; Fluid restriction: 1500 mL Fluid  Diet effective now                    Brief/Interim Summary: 89 y.o.f w/ chronic atrial fibrillation on Eliquis, HFpEF, history of breast cancer followed by [facility]-presented to the hospital with altered mental status.Per history from son/patient/ED staff-apparently for the past 2-3 days-patient has developed URI-like symptoms and had a fever of 102 F 4/9 - seen by  PCP given a shot of Rocephin-and placed on cough syrup with Phenergan. On 4/10 am when patient's son went to her house-she he found very altered and groggy. He noticed that the cough syrup with Phenerganand it-was almost empty.  It looks like-overnight-patient had multiple coughing spells-and inadvertently almost drank the whole bottle. In ED - poison control informed supportive care was recommended.  Patient's patient's blood pressure was soft-she was still altered and admitted. In the ED possible influenza A came back positive-overall patient has been afebrile.  Patient had mild lactic acidosis and improved   Patient was hospitalized continue on supportive measures, Tamiflu, steroid, with his wheezing resolved, no more cough, overall doing much better mentation slowly improved. Did well with PT OT on ambulation, son and patient are stating they would like to be discharged home today. Adamantly requesting for discharge  Discharge Diagnoses:  Principal Problem:   Altered mental status, unspecified Active Problems:   HTN  (hypertension)   Chronic atrial fibrillation   Major depressive disorder, single episode   AMS (altered mental status)  Acute toxic encephalopathy: Multifactorial in the setting of unintentional drug use with cough syrup with Phenergan, also with acute viral illness.CT head negative for acute finding. As the day went by mentation much improved patient's son arrived he feels patient has improved hard of hearing.  She did work with PT OT and did well.  I came and reevaluated her, I offered her to keep her 1 more day but at this time son refusing and would like the patient to go home, he will be with her 24/7, he feels patient will do much better at home.  -Will discharge her advised to follow-up with PCP closely  Mild leukopenia due to viral infection.  Follow-up with PCP. Borderline hypotension: Currently BP stable.resume home cardizem bisoprolol. Acute bronchitis-influenza A: wheezing is resolve cont po steroidx4 days,continue Tamiflu. Chronic  atrial fibrillation: Rate controlled on Eliquis and Cardizem.  Resumed bisoprolol. HFpEF: Currently euvolemic,cont Aldactone. Deconditioning/debility continue PT OT eval completed and she did well no further need.   Consults: PTOT  Subjective: Seen 1 already standing and more day right any longer here examined Son arrived later- he feels she is back to normal, she wal ocularked w/ PTOT  Discharge Exam: Vitals:   10/01/22 0800 10/01/22 1015  BP: (!) 151/98   Pulse: 100 79  Resp: (!) 21 19  Temp: 97.9 F (36.6 C)   SpO2: 100% 90%   General: Pt is alert, awake, not in acute distress Cardiovascular: RRR, S1/S2 +, no rubs, no gallops Respiratory: CTA bilaterally, no  wheezing, no rhonchi Abdominal: Soft, NT, ND, bowel sounds + Extremities: no edema, no cyanosis  Discharge Instructions  Discharge Instructions     Discharge instructions   Complete by: As directed    Please call call MD or return to ER for similar or worsening recurring  problem that brought you to hospital or if any fever,nausea/vomiting,abdominal pain, uncontrolled pain, chest pain,  shortness of breath or any other alarming symptoms.  Please follow-up your doctor as instructed in a week time and call the office for appointment.  Please avoid alcohol, smoking, or any other illicit substance and maintain healthy habits including taking your regular medications as prescribed.  You were cared for by a hospitalist during your hospital stay. If you have any questions about your discharge medications or the care you received while you were in the hospital after you are discharged, you can call the unit and ask to speak with the hospitalist on call if the hospitalist that took care of you is not available.  Once you are discharged, your primary care physician will handle any further medical issues. Please note that NO REFILLS for any discharge medications will be authorized once you are discharged, as it is imperative that you return to your primary care physician (or establish a relationship with a primary care physician if you do not have one) for your aftercare needs so that they can reassess your need for medications and monitor your lab values   Increase activity slowly   Complete by: As directed       Allergies as of 10/01/2022   No Known Allergies      Medication List     TAKE these medications    apixaban 5 MG Tabs tablet Commonly known as: ELIQUIS Take 1 tablet (5 mg total) by mouth 2 (two) times daily.   bisoprolol 5 MG tablet Commonly known as: ZEBETA Take 5 mg by mouth daily.   blood glucose meter kit and supplies Kit Dispense based on patient and insurance preference. Use up to four times daily as directed.   blood glucose meter kit and supplies Kit Dispense based on patient and insurance preference. Use up to four times daily as directed.   diltiazem 60 MG 12 hr capsule Commonly known as: CARDIZEM SR Take 1 capsule (60 mg total) by  mouth every evening.   DULoxetine 30 MG capsule Commonly known as: CYMBALTA Take 30 mg by mouth at bedtime.   freestyle lancets Please give generic lancets 60 for 1 month supply to be tested x 2 per day   FREESTYLE TEST STRIPS test strip Generic drug: glucose blood Please give generic test strips 60 for 1 month supply to be tested x 2 per day   guaiFENesin 600 MG 12 hr tablet Commonly known as: MUCINEX Take 1 tablet (600 mg total) by mouth 2 (two) times daily for 7 days.   melatonin 5 MG Tabs Take 5 mg by mouth at bedtime.   oseltamivir 30 MG capsule Commonly known as: TAMIFLU Take 1 capsule (30 mg total) by mouth 2 (two) times daily for 7 doses.   predniSONE 20 MG tablet Commonly known as: DELTASONE Take 1 tablet (20 mg total) by mouth daily with breakfast for 4 days. Start taking on: October 02, 2022   spironolactone 25 MG tablet Commonly known as: ALDACTONE        Follow-up Information     Jarrett Soho, PA-C Follow up in 1 week(s).   Specialty: Family Medicine Contact information: [address] [place] [address] [phone]                No Known Allergies  The results of significant diagnostics from this hospitalization (including imaging, microbiology, ancillary and laboratory) are listed below for reference.    Microbiology: Recent Results (from the past 240 hour(s))  Blood Culture (routine x 2)     Status: None (Preliminary result)   Collection Time: 09/29/22  1:54 PM   Specimen: BLOOD  Result Value Ref Range Status   Specimen Description BLOOD BLOOD RIGHT HAND  Final   Special Requests   Final    BOTTLES DRAWN AEROBIC AND ANAEROBIC Blood Culture results may not be optimal due to an inadequate volume of blood received in culture bottles   Culture   Final    NO GROWTH 2 DAYS Performed at [facility], [address] [place], [place] [address]    Report Status PENDING  Incomplete  Resp panel by RT-PCR (RSV, Flu A&B,  Covid) Anterior Nasal Swab     Status: Abnormal   Collection Time: 09/29/22  1:56 PM   Specimen: Anterior Nasal Swab  Result Value Ref Range Status   SARS Coronavirus 2 by RT PCR NEGATIVE NEGATIVE Final   Influenza A by PCR POSITIVE (A) NEGATIVE Final   Influenza B by PCR NEGATIVE NEGATIVE Final    Comment: (NOTE) The Xpert Xpress SARS-CoV-2/FLU/RSV plus assay is intended as an aid in the diagnosis of influenza from Nasopharyngeal swab specimens and should not be used as a sole basis for treatment. Nasal washings and aspirates are unacceptable for Xpert Xpress SARS-CoV-2/FLU/RSV testing.  Fact Sheet for Patients: BloggerCourse.com  Fact Sheet for Healthcare Providers: SeriousBroker.it  This test is not yet approved or cleared by the [place] FDA and has been authorized for detection and/or diagnosis of SARS-CoV-2 by FDA under an Emergency Use Authorization (EUA). This EUA will remain in effect (meaning this test can be used) for the duration of the COVID-19 declaration under Section 564(b)(1) of the Act, 21 U.S.C. section 360bbb-3(b)(1), unless the authorization is terminated or revoked.     Resp Syncytial Virus by PCR NEGATIVE NEGATIVE Final    Comment: (NOTE) Fact Sheet for Patients: BloggerCourse.com  Fact Sheet for Healthcare Providers: SeriousBroker.it  This test is not yet approved or cleared by the [place] FDA and has been authorized for detection and/or diagnosis of SARS-CoV-2 by FDA under an Emergency Use Authorization (EUA). This EUA will remain in effect (meaning this test can be used) for the duration of the COVID-19 declaration under Section 564(b)(1) of the Act, 21 U.S.C. section 360bbb-3(b)(1), unless the authorization is terminated or revoked.  Performed at [facility], [address], [place], [place] [address]   Blood Culture (routine x  2)     Status: None (Preliminary result)   Collection Time: 09/29/22  1:59 PM   Specimen: BLOOD  Result Value Ref Range Status   Specimen Description BLOOD LEFT ANTECUBITAL  Final   Special Requests   Final    BOTTLES DRAWN AEROBIC AND ANAEROBIC Blood Culture adequate volume   Culture   Final    NO GROWTH 2 DAYS Performed at [facility], [address], [place], [place] [address]    Report Status PENDING  Incomplete    Procedures/Studies: CT ABDOMEN PELVIS W CONTRAST  Result Date: 09/29/2022 CLINICAL DATA:  89 year old female with acute abdominal and pelvic pain. EXAM: CT ABDOMEN AND PELVIS WITH CONTRAST  TECHNIQUE: Multidetector CT imaging of the abdomen and pelvis was performed using the standard protocol following bolus administration of intravenous contrast. RADIATION DOSE REDUCTION: This exam was performed according to the departmental dose-optimization program which includes automated exposure control, adjustment of the mA and/or kV according to patient size and/or use of iterative reconstruction technique. CONTRAST:  70mL OMNIPAQUE IOHEXOL 350 MG/ML SOLN COMPARISON:  01/02/2010 CT FINDINGS: Lower chest: No acute abnormality.  Cardiomegaly identified. Hepatobiliary: No significant hepatic abnormalities are identified. Cholelithiasis noted without definite evidence of acute cholecystitis. There is no evidence of intrahepatic or extrahepatic biliary dilatation. Pancreas: Unremarkable Spleen: Unremarkable Adrenals/Urinary Tract: The kidneys, adrenal glands and bladder are unremarkable except for a stable 3.2 cm LEFT renal angiomyolipoma. Stomach/Bowel: Stomach is within normal limits. Appendix appears normal. No evidence of bowel wall thickening, distention, or inflammatory changes. Colonic diverticulosis identified without evidence of acute diverticulitis. Vascular/Lymphatic: Aortic atherosclerosis. No enlarged abdominal or pelvic lymph nodes. Reproductive: No significant abnormality.  Other: No ascites, focal collection or pneumoperitoneum. Musculoskeletal: No acute or suspicious bony abnormalities are noted. Degenerative changes in the LOWER lumbar spine and grade 1 spondylolisthesis at L4-L5 again noted. IMPRESSION: 1. No evidence of acute abnormality. 2. Cholelithiasis without definite evidence of acute cholecystitis. Consider ultrasound as clinically indicated. 3. Stable 3.2 cm LEFT renal angiomyolipoma. 4. Cardiomegaly and aortic Atherosclerosis (ICD10-I70.0). Electronically Signed   By: Harmon Pier M.D.   On: 09/29/2022 17:17   CT Head Wo Contrast  Result Date: 09/29/2022 CLINICAL DATA:  Delirium EXAM: CT HEAD WITHOUT CONTRAST TECHNIQUE: Contiguous axial images were obtained from the base of the skull through the vertex without intravenous contrast. RADIATION DOSE REDUCTION: This exam was performed according to the departmental dose-optimization program which includes automated exposure control, adjustment of the mA and/or kV according to patient size and/or use of iterative reconstruction technique. COMPARISON:  CT head 04/16/21 FINDINGS: Brain: Negative for an acute infarct. No hydrocephalus. No extra-axial fluid collection. Redemonstrated is a chronic infarct in the right parietal lobe and in the right opercular region. Redemonstrated is an extra-axial lesion along the left frontal lobe measuring 8 mm, unchanged compared to 04/16/2021, and favored to represent a meningioma. Sequela of moderate chronic microvascular ischemic change. Vascular: No hyperdense vessel or unexpected calcification. Skull: Normal. Negative for fracture or focal lesion. Sinuses/Orbits: No middle ear or mastoid effusion. Paranasal sinuses are notable for trace mucosal thickening in the bilateral maxillary sinuses. Bilateral lens replacement. Orbits are otherwise unremarkable. Other: None. IMPRESSION: 1. No acute intracranial process. 2. Unchanged 8 mm extra-axial lesion along the left frontal lobe, favored to  represent a meningioma. 3. Redemonstrated sequela of chronic microvascular ischemic change with chronic infarcts in the right frontal opercular region and in the right parietal lobe. Electronically Signed   By: Lorenza Cambridge M.D.   On: 09/29/2022 17:09   DG Chest Port 1 View  Result Date: 09/29/2022 CLINICAL DATA:  Possible sepsis.  Recently diagnosed with pneumonia. EXAM: PORTABLE CHEST 1 VIEW COMPARISON:  07/15/2022 FINDINGS: Patient rotated right. Numerous leads and wires project over the chest. Mild cardiomegaly, accentuated by AP portable technique. No pleural effusion or pneumothorax. No congestive failure. Mild left hemidiaphragm elevation with similar left base volume loss and scarring. Surgical clips project over the right lower chest. IMPRESSION: No evidence of pneumonia. Mild cardiomegaly, without congestive failure. Left base scarring. Electronically Signed   By: Jeronimo Greaves M.D.   On: 09/29/2022 14:08    Labs: BNP (last 3 results) No results for input(s): "BNP" in  the last 8760 hours. Basic Metabolic Panel: Recent Labs  Lab 09/29/22 1354 09/30/22 0451  NA 136 138  K 4.4 3.6  CL 101 106  CO2 22 25  GLUCOSE 110* 84  BUN 14 13  CREATININE 1.05* 0.76  CALCIUM 8.0* 7.8*   Liver Function Tests: Recent Labs  Lab 09/29/22 1354  AST 35  ALT 18  ALKPHOS 36*  BILITOT 1.4*  PROT 5.8*  ALBUMIN 3.1*   Recent Labs  Lab 09/29/22 1354 09/30/22 0451  WBC 4.1 3.2*  NEUTROABS 2.5  --   HGB 14.9 13.4  HCT 45.6 40.9  MCV 98.9 98.3  PLT 133* 147*  No results for input(s): "VITAMINB12", "FOLATE", "FERRITIN", "TIBC", "IRON", "RETICCTPCT" in the last 72 hours. Urinalysis    Component Value Date/Time   COLORURINE YELLOW 04/16/2021 1208   APPEARANCEUR HAZY (A) 04/16/2021 1208   LABSPEC 1.018 04/16/2021 1208   PHURINE 8.0 04/16/2021 1208   GLUCOSEU NEGATIVE 04/16/2021 1208   HGBUR NEGATIVE 04/16/2021 1208   BILIRUBINUR NEGATIVE 04/16/2021 1208   BILIRUBINUR negative 09/01/2012  1506   KETONESUR NEGATIVE 04/16/2021 1208   PROTEINUR TRACE (A) 04/16/2021 1208   UROBILINOGEN 0.2 09/27/2014 1145   NITRITE NEGATIVE 04/16/2021 1208   LEUKOCYTESUR LARGE (A) 04/16/2021 1208   Sepsis Labs Recent Labs  Lab 09/29/22 1354 09/30/22 0451  WBC 4.1 3.2*   Microbiology Recent Results (from the past 240 hour(s))  Blood Culture (routine x 2)     Status: None (Preliminary result)   Collection Time: 09/29/22  1:54 PM   Specimen: BLOOD  Result Value Ref Range Status   Specimen Description BLOOD BLOOD RIGHT HAND  Final   Special Requests   Final    BOTTLES DRAWN AEROBIC AND ANAEROBIC Blood Culture results may not be optimal due to an inadequate volume of blood received in culture bottles   Culture   Final    NO GROWTH 2 DAYS Performed at [facility], [address] [place], [place] [address]    Report Status PENDING  Incomplete  Resp panel by RT-PCR (RSV, Flu A&B, Covid) Anterior Nasal Swab     Status: Abnormal   Collection Time: 09/29/22  1:56 PM   Specimen: Anterior Nasal Swab  Result Value Ref Range Status   SARS Coronavirus 2 by RT PCR NEGATIVE NEGATIVE Final   Influenza A by PCR POSITIVE (A) NEGATIVE Final   Influenza B by PCR NEGATIVE NEGATIVE Final    Comment: (NOTE) The Xpert Xpress SARS-CoV-2/FLU/RSV plus assay is intended as an aid in the diagnosis of influenza from Nasopharyngeal swab specimens and should not be used as a sole basis for treatment. Nasal washings and aspirates are unacceptable for Xpert Xpress SARS-CoV-2/FLU/RSV testing.  Fact Sheet for Patients: BloggerCourse.com  Fact Sheet for Healthcare Providers: SeriousBroker.it  This test is not yet approved or cleared by the [place] FDA and has been authorized for detection and/or diagnosis of SARS-CoV-2 by FDA under an Emergency Use Authorization (EUA). This EUA will remain in effect (meaning this test can be used) for the duration  of the COVID-19 declaration under Section 564(b)(1) of the Act, 21 U.S.C. section 360bbb-3(b)(1), unless the authorization is terminated or revoked.     Resp Syncytial Virus by PCR NEGATIVE NEGATIVE Final    Comment: (NOTE) Fact Sheet for Patients: BloggerCourse.com  Fact Sheet for Healthcare Providers: SeriousBroker.it  This test is not yet approved or cleared by the [place] FDA and has been authorized for detection and/or diagnosis  of SARS-CoV-2 by FDA under an Emergency Use Authorization (EUA). This EUA will remain in effect (meaning this test can be used) for the duration of the COVID-19 declaration under Section 564(b)(1) of the Act, 21 U.S.C. section 360bbb-3(b)(1), unless the authorization is terminated or revoked.  Performed at [facility], [address], [place], [place] [address]   Blood Culture (routine x 2)     Status: None (Preliminary result)   Collection Time: 09/29/22  1:59 PM   Specimen: BLOOD  Result Value Ref Range Status   Specimen Description BLOOD LEFT ANTECUBITAL  Final   Special Requests   Final    BOTTLES DRAWN AEROBIC AND ANAEROBIC Blood Culture adequate volume   Culture   Final    NO GROWTH 2 DAYS Performed at [facility], [address] [place], [place] [address]    Report Status PENDING  Incomplete   Time coordinating discharge: 25 minutes  SIGNED: Lanae Boast, MD  [facility] 10/01/2022, 1:07 PM  If 7PM-7AM, please contact night-coverage [email or web address]

## 2022-10-01 NOTE — TOC Initial Note (Signed)
Transition of Care [facility]) - Initial/Assessment Note    Patient Details  Name: Natalie Mendoza MRN: [id number] Date of Birth: 1933-02-18  Transition of Care [facility]) CM/SW Contact:    Gordy Clement, RN Phone Number: 10/01/2022, 6:33 AM  Clinical Narrative:    CM met with Patient bedside. Patient was very pleasant . Patient states she lives with a roommate and is active and independent . She does not use any DME.  Patient has an established PCP. Daughter is a huge support to patient and will provie transportation at DC. TOC will continue to follow patient for any additional discharge needs                      Barriers to Discharge: Continued Medical Work up   Patient Goals and CMS Choice Patient states their goals for this hospitalization and ongoing recovery are:: return home CMS Medicare.gov Compare Post Acute Care list provided to::  (N/A) Choice offered to / list presented to : NA      Expected Discharge Plan and Services In-house Referral: NA Discharge Planning Services: NA Post Acute Care Choice: NA Living arrangements for the past 2 months: Apartment                 DME Arranged: N/A DME Agency: NA         HH Agency: NA        Prior Living Arrangements/Services Living arrangements for the past 2 months: Apartment Lives with:: Roommate Patient language and need for interpreter reviewed:: Yes        Need for Family Participation in Patient Care: Yes (Comment) (first 24-48 hours) Care giver support system in place?: Yes (comment) Current home services: Other (comment) (NONE) Criminal Activity/Legal Involvement Pertinent to Current Situation/Hospitalization: No - Comment as needed  Activities of Daily Living Home Assistive Devices/Equipment: None ADL Screening (condition at time of admission) Patient's cognitive ability adequate to safely complete daily activities?: Yes Is the patient deaf or have difficulty hearing?: Yes Does the patient have difficulty  seeing, even when wearing glasses/contacts?: No Does the patient have difficulty concentrating, remembering, or making decisions?: Yes Patient able to express need for assistance with ADLs?: Yes Does the patient have difficulty dressing or bathing?: No Independently performs ADLs?: Yes (appropriate for developmental age) Does the patient have difficulty walking or climbing stairs?: No Weakness of Legs: None Weakness of Arms/Hands: None  Permission Sought/Granted                  Emotional Assessment Appearance:: Appears younger than stated age Attitude/Demeanor/Rapport: Charismatic, Gracious Affect (typically observed): Pleasant Orientation: : Oriented to Self, Oriented to Place, Oriented to Situation, Oriented to  Time Alcohol / Substance Use: Not Applicable Psych Involvement: No (comment)  Admission diagnosis:  Anticholinergic drug overdose, accidental or unintentional, initial encounter [T44.3X1A] Altered mental status, unspecified [R41.82] AMS (altered mental status) [R41.82] Patient Active Problem List   Diagnosis Date Noted   AMS (altered mental status) 09/30/2022   Altered mental status, unspecified 09/29/2022   Pneumonia due to COVID-19 virus 05/28/2021   Acute hypoxemic respiratory failure due to COVID-19 05/28/2021   Syncope: Questionable 04/17/2021   Recurrent urinary tract infection 01/02/2020   Vaginal ulcer 01/02/2020   Chronic venous insufficiency 10/05/2019   Type 2 diabetes mellitus without complication, without long-term current use of insulin 08/15/2019   Vitamin D deficiency 07/24/2019   Status post left knee replacement 03/22/2019   SOB (shortness of breath) 12/15/2017  Hyperlipidemia    Sore throat    Chronic atrial fibrillation 08/01/2016   Chronic systolic CHF (congestive heart failure) 08/01/2016   Abdominal distension 11/23/2015   Nuclear sclerotic cataract of both eyes 03/29/2014   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/29/2014   Osteoporosis  with fracture 08/28/2013   Left-sided weakness 03/13/2013   TIA (transient ischemic attack) 03/13/2013   HTN (hypertension) 03/13/2013   Dyslipidemia 03/13/2013   Depression 03/13/2013   Breast cancer (HCC) 09/01/2012   Deafness, sensorineural 07/31/2012   Hearing aid worn 07/31/2012   Other seborrheic keratosis 04/05/2011   Xerosis cutis 04/05/2011   Anxiety 03/17/2011   Sleep related leg cramps 12/06/2007   Major depressive disorder, single episode 12/03/2007   Arthropathia 11/28/2007   Age-related osteoporosis without current pathological fracture 11/28/2007   Asymptomatic varicose veins 11/28/2007   Pain in limb 11/28/2007   Swelling of limb 11/28/2007   PCP:  Jarrett Soho, PA-C Pharmacy:   [facility] [address] - [place], [place] - [address] [facility] [place] [address] Phone: [phone] Fax: [phone]  [id number] [place], [place] - [address] AT [facility] [address] [facility] [address] [address] [facility] [address] [place] [address] Phone: [phone] Fax: [phone]     Social Determinants of Health (SDOH) Social History: SDOH Screenings   Food Insecurity: No Food Insecurity (09/29/2022)  Housing: Low Risk  (09/29/2022)  Transportation Needs: No Transportation Needs (09/29/2022)  Utilities: Not At Risk (09/29/2022)  Tobacco Use: Low Risk  (09/29/2022)   SDOH Interventions:     Readmission Risk Interventions     No data to display

## 2022-10-02 LAB — CULTURE, BLOOD (ROUTINE X 2): Special Requests: ADEQUATE

## 2022-10-03 LAB — CULTURE, BLOOD (ROUTINE X 2): Culture: NO GROWTH

## 2022-10-04 LAB — CULTURE, BLOOD (ROUTINE X 2): Culture: NO GROWTH

## 2023-03-23 ENCOUNTER — Emergency Department (HOSPITAL_COMMUNITY)
Admission: EM | Admit: 2023-03-23 | Discharge: 2023-03-23 | Disposition: A | Discharge status: Home / Self Care | Payer: Medicare Other | Attending: Emergency Medicine | Admitting: Emergency Medicine

## 2023-03-23 ENCOUNTER — Encounter (HOSPITAL_COMMUNITY): Payer: Self-pay

## 2023-03-23 ENCOUNTER — Emergency Department (HOSPITAL_COMMUNITY): Payer: Medicare Other

## 2023-03-23 DIAGNOSIS — N189 Chronic kidney disease, unspecified: Secondary | ICD-10-CM | POA: Diagnosis not present

## 2023-03-23 DIAGNOSIS — Z79899 Other long term (current) drug therapy: Secondary | ICD-10-CM | POA: Insufficient documentation

## 2023-03-23 DIAGNOSIS — N3000 Acute cystitis without hematuria: Secondary | ICD-10-CM | POA: Diagnosis not present

## 2023-03-23 DIAGNOSIS — I509 Heart failure, unspecified: Secondary | ICD-10-CM | POA: Insufficient documentation

## 2023-03-23 DIAGNOSIS — M62830 Muscle spasm of back: Secondary | ICD-10-CM | POA: Diagnosis not present

## 2023-03-23 DIAGNOSIS — R109 Unspecified abdominal pain: Secondary | ICD-10-CM | POA: Diagnosis present

## 2023-03-23 DIAGNOSIS — Z7901 Long term (current) use of anticoagulants: Secondary | ICD-10-CM | POA: Insufficient documentation

## 2023-03-23 LAB — CBC
HCT: 47.9 % — ABNORMAL HIGH (ref 36.0–46.0)
Hemoglobin: 15.7 g/dL — ABNORMAL HIGH (ref 12.0–15.0)
MCH: 32.2 pg (ref 26.0–34.0)
MCHC: 32.8 g/dL (ref 30.0–36.0)
MCV: 98.4 fL (ref 80.0–100.0)
Platelets: 197 10*3/uL (ref 150–400)
RBC: 4.87 MIL/uL (ref 3.87–5.11)
RDW: 13 % (ref 11.5–15.5)
WBC: 8.9 10*3/uL (ref 4.0–10.5)
nRBC: 0 % (ref 0.0–0.2)

## 2023-03-23 LAB — COMPREHENSIVE METABOLIC PANEL
ALT: 20 U/L (ref 0–44)
AST: 25 U/L (ref 15–41)
Albumin: 3.8 g/dL (ref 3.5–5.0)
Alkaline Phosphatase: 39 U/L (ref 38–126)
Anion gap: 16 — ABNORMAL HIGH (ref 5–15)
BUN: 24 mg/dL — ABNORMAL HIGH (ref 8–23)
CO2: 22 mmol/L (ref 22–32)
Calcium: 9.6 mg/dL (ref 8.9–10.3)
Chloride: 100 mmol/L (ref 98–111)
Creatinine, Ser: 0.82 mg/dL (ref 0.44–1.00)
GFR, Estimated: >60 mL/min (ref >60)
Glucose, Bld: 116 mg/dL — ABNORMAL HIGH (ref 70–99)
Potassium: 4.1 mmol/L (ref 3.5–5.1)
Sodium: 138 mmol/L (ref 135–145)
Total Bilirubin: 1 mg/dL (ref 0.3–1.2)
Total Protein: 7 g/dL (ref 6.5–8.1)

## 2023-03-23 LAB — URINALYSIS, ROUTINE W REFLEX MICROSCOPIC
Bilirubin Urine: NEGATIVE
Glucose, UA: 50 mg/dL — AB
Hgb urine dipstick: NEGATIVE
Ketones, ur: NEGATIVE mg/dL
Nitrite: NEGATIVE
Protein, ur: NEGATIVE mg/dL
Specific Gravity, Urine: 1.02 (ref 1.005–1.030)
pH: 5 (ref 5.0–8.0)

## 2023-03-23 LAB — TROPONIN I (HIGH SENSITIVITY): Troponin I (High Sensitivity): 6 ng/L (ref <18)

## 2023-03-23 MED ORDER — LIDOCAINE 5 % EX PTCH: 1.0000 | MEDICATED_PATCH | CUTANEOUS | 0 refills | Status: AC

## 2023-03-23 MED ORDER — LIDOCAINE 5 % EX PTCH
1.0000 | MEDICATED_PATCH | CUTANEOUS | Status: DC
  Administered 2023-03-23: 1 via TRANSDERMAL
  Filled 2023-03-23: qty 1

## 2023-03-23 MED ORDER — CEPHALEXIN 250 MG PO CAPS
500.0000 mg | ORAL_CAPSULE | Freq: Once | ORAL | Status: AC
  Administered 2023-03-23: 500 mg via ORAL
  Filled 2023-03-23: qty 2

## 2023-03-23 MED ORDER — CEPHALEXIN 500 MG PO CAPS: 500.0000 mg | ORAL_CAPSULE | Freq: Two times a day (BID) | ORAL | 0 refills | Status: AC

## 2023-03-23 NOTE — Discharge Instructions (Addendum)
There is no evidence of kidney stone or cardiac or other abdominal process at this time.  I am treating you for a possible urinary tract infection with antibiotics.  Take your next dose later tonight before bedtime or tomorrow morning.  I prescribed you lidocaine patches for further pain relief as well in case that this is something muscular.  Follow-up with your primary care doctor and return if symptoms worsen.

## 2023-03-23 NOTE — ED Provider Notes (Signed)
[facility] DEPART[facility] AT [facility] Provider Note   CSN: [id number] Arrival date & time: 03/23/23  1143     History  Chief Complaint  Patient presents with   Back Pain    Natalie Mendoza is a 89 y.o. female.  Patient here after some right flank pain earlier this morning now improving.  Pain was going up and down from the right flank.  No pain with urination.  No history of kidney stones.  Denies any chest pain or shortness of breath or cough or sputum production or trauma.  She has a history of CKD, A-fib on Eliquis, heart failure.  Her symptoms have mostly resolved but her pain seems to be reproducible and worse when she twisted or moved.  Nothing makes it worse or better.  Family thought may be a kidney stone because family friend with similar symptoms in the past.  She denies any weakness numbness tingling.  No chest pain  The history is provided by the patient.       Home Medications Prior to Admission medications   Medication Sig Start Date End Date Taking? Authorizing Provider  apixaban (ELIQUIS) 5 MG TABS tablet Take 1 tablet (5 mg total) by mouth 2 (two) times daily. 03/15/13  Yes Oti, Osie Cheeks, MD  bisoprolol (ZEBETA) 5 MG tablet Take 2.5 mg by mouth daily. 05/03/20  Yes [provider]  cephALEXin (KEFLEX) 500 MG capsule Take 1 capsule (500 mg total) by mouth 2 (two) times daily for 5 days. 03/23/23 03/28/23 Yes Eilan Mcinerny, DO  diltiazem (CARDIZEM SR) 60 MG 12 hr capsule Take 1 capsule (60 mg total) by mouth every evening. 04/27/21  Yes Rodolph Bong, MD  DULoxetine (CYMBALTA) 30 MG capsule Take 30 mg by mouth at bedtime.   Yes [provider]  lidocaine (LIDODERM) 5 % Place 1 patch onto the skin daily. Remove & Discard patch within 12 hours or as directed by MD 03/23/23  Yes Dania Marsan, DO  melatonin 5 MG TABS Take 5 mg by mouth at bedtime.   Yes [provider]  spironolactone (ALDACTONE) 25 MG tablet Take 25 mg by  mouth daily. 10/13/21  Yes [provider]  blood glucose meter kit and supplies KIT Dispense based on patient and insurance preference. Use up to four times daily as directed. 05/30/21   Rhetta Mura, MD  blood glucose meter kit and supplies KIT Dispense based on patient and insurance preference. Use up to four times daily as directed. 05/30/21   Rhetta Mura, MD  glucose blood (FREESTYLE TEST STRIPS) test strip Please give generic test strips 60 for 1 month supply to be tested x 2 per day Patient not taking: Reported on 09/24/2022 05/30/21   Rhetta Mura, MD  Lancets (FREESTYLE) lancets Please give generic lancets 60 for 1 month supply to be tested x 2 per day Patient not taking: Reported on 08/04/2022 05/30/21   Rhetta Mura, MD      Allergies    Patient has no known allergies.    Review of Systems   Review of Systems  Physical Exam Updated Vital Signs BP 121/70 (BP Location: Left Arm)   Pulse (!) 51   Temp 98.1 F (36.7 C)   Resp 17   Ht 5\' 5"  (1.651 m)   Wt 72.1 kg   SpO2 95%   BMI 26.45 kg/m  Physical Exam Vitals and nursing note reviewed.  Constitutional:      General: She is not  in acute distress.    Appearance: She is well-developed.  HENT:     Head: Normocephalic and atraumatic.     Nose: Nose normal.     Mouth/Throat:     Mouth: Mucous membranes are moist.  Eyes:     Extraocular Movements: Extraocular movements intact.     Conjunctiva/sclera: Conjunctivae normal.     Pupils: Pupils are equal, round, and reactive to light.  Cardiovascular:     Rate and Rhythm: Normal rate and regular rhythm.     Pulses: Normal pulses.     Heart sounds: Normal heart sounds. No murmur heard. Pulmonary:     Effort: Pulmonary effort is normal. No respiratory distress.     Breath sounds: Normal breath sounds.  Abdominal:     Palpations: Abdomen is soft.     Tenderness: There is no abdominal tenderness. There is right CVA tenderness.   Musculoskeletal:        General: No swelling.     Cervical back: Normal range of motion and neck supple.  Skin:    General: Skin is warm and dry.     Capillary Refill: Capillary refill takes less than 2 seconds.  Neurological:     General: No focal deficit present.     Mental Status: She is alert and oriented to person, place, and time.     Cranial Nerves: No cranial nerve deficit.     Sensory: No sensory deficit.     Motor: No weakness.     Coordination: Coordination normal.     Comments: 5+ out of 5 strength throughout, normal sensation, no drift, normal finger-nose-finger, normal speech  Psychiatric:        Mood and Affect: Mood normal.     ED Results / Procedures / Treatments   Labs (all labs ordered are listed, but only abnormal results are displayed) Labs Reviewed  CBC - Abnormal; Notable for the following components:      Result Value   Hemoglobin 15.7 (*)    HCT 47.9 (*)    All other components within normal limits  COMPREHENSIVE METABOLIC PANEL - Abnormal; Notable for the following components:   Glucose, Bld 116 (*)    BUN 24 (*)    Anion gap 16 (*)    All other components within normal limits  URINALYSIS, ROUTINE W REFLEX MICROSCOPIC - Abnormal; Notable for the following components:   APPearance HAZY (*)    Glucose, UA 50 (*)    Leukocytes,Ua LARGE (*)    Bacteria, UA FEW (*)    All other components within normal limits  URINE CULTURE  TROPONIN I (HIGH SENSITIVITY)    EKG EKG Interpretation Date/Time:  Wednesday March 23 2023 11:40:58 EDT Ventricular Rate:  50 PR Interval:    QRS Duration:  140 QT Interval:  474 QTC Calculation: 432 R Axis:   -80  Text Interpretation: Atrial fibrillation with bradycardia Left axis deviation Right bundle branch block Septal infarct , age undetermined Possible Lateral infarct , age undetermined Inferior infarct , age undetermined Confirmed by Pricilla Loveless ([id number]) on 03/23/2023 12:16:59 PM  Radiology CT Renal Stone  Study  Result Date: 03/23/2023 CLINICAL DATA:  Acute onset lower back pain radiating to the right side and shoulder waking patient from sleep EXAM: CT ABDOMEN AND PELVIS WITHOUT CONTRAST TECHNIQUE: Multidetector CT imaging of the abdomen and pelvis was performed following the standard protocol without IV contrast. RADIATION DOSE REDUCTION: This exam was performed according to the departmental dose-optimization program which includes automated exposure  control, adjustment of the mA and/or kV according to patient size and/or use of iterative reconstruction technique. COMPARISON:  CT abdomen and pelvis dated 09/29/2022 FINDINGS: Lower chest: No focal consolidation or pulmonary nodule in the lung bases. No pleural effusion or pneumothorax demonstrated. Multichamber cardiomegaly. Hepatobiliary: No focal hepatic lesions. No intra or extrahepatic biliary ductal dilation. Cholelithiasis. Pancreas: Mildly atrophic pancreas.  No main ductal dilation. Spleen: Normal in size without focal abnormality. Adrenals/Urinary Tract: No adrenal nodules. No calculi or hydronephrosis. Fat-containing left upper pole renal mass measures 3.3 x 2.4 cm (3:15) keeping with angiomyolipoma, unchanged. No focal bladder wall thickening. Stomach/Bowel: Normal appearance of the stomach. No evidence of bowel wall thickening, distention, or inflammatory changes. Colonic diverticulosis without acute diverticulitis. Normal appendix. Vascular/Lymphatic: Aortic atherosclerosis. No enlarged abdominal or pelvic lymph nodes. Reproductive: No adnexal masses.  Vaginal pessary in-situ. Other: No free fluid, fluid collection, or free air. Musculoskeletal: No acute or abnormal lytic or blastic osseous lesions. Multilevel degenerative changes of the partially imaged thoracic and lumbar spine. Grade 1 anterolisthesis at L4-5, unchanged. Small fat-containing left inguinal hernia. IMPRESSION: 1. No acute abnormality in the abdomen or pelvis. 2. Cholelithiasis. 3.  Extensive colonic diverticulosis without acute diverticulitis. 4. Unchanged left upper pole renal angiomyolipoma. No specific follow-up imaging recommended. 5. Aortic Atherosclerosis (ICD10-I70.0). Multichamber cardiomegaly. Electronically Signed   By: Agustin Cree M.D.   On: 03/23/2023 14:32   DG Chest 2 View  Result Date: 03/23/2023 CLINICAL DATA:  back pain.  Radiating to right side and shoulder. EXAM: CHEST - 2 VIEW COMPARISON:  09/29/2022. FINDINGS: Probable patchy atelectatic changes/scarring at the lung bases. Bilateral lung fields are otherwise clear. No acute consolidation or lung collapse. Bilateral costophrenic angles are clear. Stable cardio-mediastinal silhouette. No acute osseous abnormalities. The soft tissues are within normal limits. IMPRESSION: 1. Patchy bibasilar atelectatic changes/scarring. Electronically Signed   By: Jules Schick M.D.   On: 03/23/2023 13:29    Procedures Procedures    Medications Ordered in ED Medications  lidocaine (LIDODERM) 5 % 1 patch (1 patch Transdermal Patch Applied 03/23/23 1315)  cephALEXin (KEFLEX) capsule 500 mg (500 mg Oral Given 03/23/23 1407)    ED Course/ Medical Decision Making/ A&P                                 Medical Decision Making Amount and/or Complexity of Data Reviewed Labs: ordered. Radiology: ordered.  Risk Prescription drug management.   SUSY PLACZEK is here with right flank pain.  Normal vitals.  No fever.  History of A-fib on Eliquis, hypertension, high cholesterol.  History of CKD.  Differential diagnosis likely musculoskeletal but could be atypical ACS, kidney infection, less likely infectious process.  I have no concern for PE, she is not having shortness of breath, she is not on anticoagulation.  Pain is mostly improved.  Seems worse with movement.  Will place lidocaine patch.  She is already had blood work done prior to my evaluation and per my review and interpretation she has no significant anemia or  electrolyte abnormality.  Creatinine at baseline.  Troponin normal.  Have no concern for ACS.  Awaiting urinalysis and will get a CT scan to further evaluate for kidney stone or other intra-abdominal process.  Seems less likely to be hepatobiliary.  Is not having any frontal abdominal pain or right upper quadrant tenderness on exam.  Gallbladder and liver enzymes within normal limits.  Will reevaluate after  imaging and urinalysis.  Urinalysis somewhat equivocal for infection.  Will treat with antibiotics at this time and send urine culture.  Patient awaiting CT renal study for further evaluation.  She is able to tolerate p.o.   CT scan shows no acute process.  Some gallstones with no evidence of cholecystitis.  She not tender in the right upper quadrant.  Gallbladder and liver enzymes within normal limits.  I have no concern for cholecystitis.  Her pain is much improved.  I suspect either muscular process or UTI.  Will have her follow-up with her primary care doctor.  Antibiotics prescribed as well as lidocaine patches.  Discharged in good condition.  Family made aware.  This chart was dictated using voice recognition software.  Despite best efforts to proofread,  errors can occur which can change the documentation meaning.         Final Clinical Impression(s) / ED Diagnoses Final diagnoses:  Acute cystitis without hematuria  Muscle spasm of back    Rx / DC Orders ED Discharge Orders          Ordered    lidocaine (LIDODERM) 5 %  Every 24 hours        03/23/23 1443    cephALEXin (KEFLEX) 500 MG capsule  2 times daily        03/23/23 1443              Virgina Norfolk, DO 03/23/23 1445

## 2023-03-23 NOTE — ED Provider Triage Note (Signed)
Emergency Medicine Provider Triage Evaluation Note  Natalie Mendoza , a 89 y.o. female  was evaluated in triage.  Pt complains of right thoracic back pain upon waking around 6 AM.  Hurts to move and breathe.  Has improved, Does not feel short of breath right now. No abdominal pain.  Review of Systems  Positive: Back pain Negative: Chest pain  Physical Exam  BP 121/70 (BP Location: Left Arm)   Pulse (!) 51   Temp 98.1 F (36.7 C)   Resp 17   Ht 5\' 5"  (1.651 m)   Wt 72.1 kg   SpO2 95%   BMI 26.45 kg/m  Gen:   Awake, no distress  Resp:  Normal effort, clear lungs MSK:   No thoracic back tenderness, especially where patient endorses pain (near scapula) Abd:  No tenderness   Medical Decision Making  Medically screening exam initiated at 12:18 PM.  Appropriate orders placed.  LYNORE Mendoza was informed that the remainder of the evaluation will be completed by another provider, this initial triage assessment does not replace that evaluation, and the importance of remaining in the ED until their evaluation is complete.  Labs, EKG, CXR ordered.   Pricilla Loveless, MD 03/23/23 [phone]

## 2023-03-23 NOTE — ED Triage Notes (Signed)
Woke up with 5AM with lower back pain radiating to right side and shoulder. Denies injury/falls. Denies sob.

## 2023-03-24 LAB — URINE CULTURE

## 2023-07-30 ENCOUNTER — Emergency Department (HOSPITAL_BASED_OUTPATIENT_CLINIC_OR_DEPARTMENT_OTHER): Payer: Medicare Other

## 2023-07-30 ENCOUNTER — Emergency Department (HOSPITAL_BASED_OUTPATIENT_CLINIC_OR_DEPARTMENT_OTHER)
Admission: EM | Admit: 2023-07-30 | Discharge: 2023-07-30 | Disposition: A | Discharge status: Home / Self Care | Payer: Medicare Other | Attending: Emergency Medicine | Admitting: Emergency Medicine

## 2023-07-30 ENCOUNTER — Other Ambulatory Visit: Payer: Self-pay

## 2023-07-30 ENCOUNTER — Encounter (HOSPITAL_BASED_OUTPATIENT_CLINIC_OR_DEPARTMENT_OTHER): Payer: Self-pay

## 2023-07-30 DIAGNOSIS — Z8673 Personal history of transient ischemic attack (TIA), and cerebral infarction without residual deficits: Secondary | ICD-10-CM | POA: Diagnosis not present

## 2023-07-30 DIAGNOSIS — Z79899 Other long term (current) drug therapy: Secondary | ICD-10-CM | POA: Insufficient documentation

## 2023-07-30 DIAGNOSIS — Z7901 Long term (current) use of anticoagulants: Secondary | ICD-10-CM | POA: Diagnosis not present

## 2023-07-30 DIAGNOSIS — I130 Hypertensive heart and chronic kidney disease with heart failure and stage 1 through stage 4 chronic kidney disease, or unspecified chronic kidney disease: Secondary | ICD-10-CM | POA: Insufficient documentation

## 2023-07-30 DIAGNOSIS — W1830XA Fall on same level, unspecified, initial encounter: Secondary | ICD-10-CM | POA: Diagnosis not present

## 2023-07-30 DIAGNOSIS — R519 Headache, unspecified: Secondary | ICD-10-CM | POA: Diagnosis present

## 2023-07-30 DIAGNOSIS — E1122 Type 2 diabetes mellitus with diabetic chronic kidney disease: Secondary | ICD-10-CM | POA: Insufficient documentation

## 2023-07-30 DIAGNOSIS — I5022 Chronic systolic (congestive) heart failure: Secondary | ICD-10-CM | POA: Insufficient documentation

## 2023-07-30 DIAGNOSIS — Z853 Personal history of malignant neoplasm of breast: Secondary | ICD-10-CM | POA: Diagnosis not present

## 2023-07-30 DIAGNOSIS — N182 Chronic kidney disease, stage 2 (mild): Secondary | ICD-10-CM | POA: Diagnosis not present

## 2023-07-30 LAB — CBC WITH DIFFERENTIAL/PLATELET
Abs Immature Granulocytes: 0.04 10*3/uL (ref 0.00–0.07)
Basophils Absolute: 0 10*3/uL (ref 0.0–0.1)
Basophils Relative: 1 %
Eosinophils Absolute: 0.1 10*3/uL (ref 0.0–0.5)
Eosinophils Relative: 2 %
HCT: 48.8 % — ABNORMAL HIGH (ref 36.0–46.0)
Hemoglobin: 16.5 g/dL — ABNORMAL HIGH (ref 12.0–15.0)
Immature Granulocytes: 1 %
Lymphocytes Relative: 19 %
Lymphs Abs: 1.6 10*3/uL (ref 0.7–4.0)
MCH: 32.7 pg (ref 26.0–34.0)
MCHC: 33.8 g/dL (ref 30.0–36.0)
MCV: 96.6 fL (ref 80.0–100.0)
Monocytes Absolute: 0.9 10*3/uL (ref 0.1–1.0)
Monocytes Relative: 10 %
Neutro Abs: 5.9 10*3/uL (ref 1.7–7.7)
Neutrophils Relative %: 67 %
Platelets: 215 10*3/uL (ref 150–400)
RBC: 5.05 MIL/uL (ref 3.87–5.11)
RDW: 12.7 % (ref 11.5–15.5)
WBC: 8.6 10*3/uL (ref 4.0–10.5)
nRBC: 0 % (ref 0.0–0.2)

## 2023-07-30 LAB — COMPREHENSIVE METABOLIC PANEL
ALT: 17 U/L (ref 0–44)
AST: 20 U/L (ref 15–41)
Albumin: 4.4 g/dL (ref 3.5–5.0)
Alkaline Phosphatase: 46 U/L (ref 38–126)
Anion gap: 10 (ref 5–15)
BUN: 26 mg/dL — ABNORMAL HIGH (ref 8–23)
CO2: 29 mmol/L (ref 22–32)
Calcium: 9.7 mg/dL (ref 8.9–10.3)
Chloride: 103 mmol/L (ref 98–111)
Creatinine, Ser: 0.91 mg/dL (ref 0.44–1.00)
GFR, Estimated: >60 mL/min (ref >60)
Glucose, Bld: 134 mg/dL — ABNORMAL HIGH (ref 70–99)
Potassium: 4.3 mmol/L (ref 3.5–5.1)
Sodium: 142 mmol/L (ref 135–145)
Total Bilirubin: 2.2 mg/dL — ABNORMAL HIGH (ref 0.0–1.2)
Total Protein: 7.1 g/dL (ref 6.5–8.1)

## 2023-07-30 MED ORDER — IOHEXOL 350 MG/ML SOLN
75.0000 mL | Freq: Once | INTRAVENOUS | Status: AC | PRN
  Administered 2023-07-30: 75 mL via INTRAVENOUS

## 2023-07-30 NOTE — ED Provider Notes (Signed)
 [place] EMERGENCY DEPARTMENT AT [facility] Provider Note   CSN: [id number] Arrival date & time: 07/30/23  1704     History  Chief Complaint  Patient presents with   Loss of Consciousness   Fall    BRETTNEY FICKEN is a 89 y.o. female.  HPI      49 female with a history of CHF, atrial fibrillation on Eliquis  5 mg twice daily, CKD, type 2 diabetes, hyperlipidemia, rheumatoid arthritis, CVA/TIA who presents with concern for sudden headache and fall.  Today was in normal state of health, daughter had just been at her home.  Suddenly around 3 PM developed sudden onset severe headache that started on the left side and the radiated to the right side and to neck. THe pain took about 30 minutes to peak from when it started and was so severe that she felt she could no longer stand and as she fell back she pulled out the drawer and fell back hitting her head.  Denies LOC. Has not had vomiting and denies any focal numbness, weakness, difficulty talking or walking, change in vision.  The intense headache has eased off but is about 4/10 soreness now to the back of the head.   Past Medical History:  Diagnosis Date   Arrhythmia    Atrial Fib   Atrial fibrillation (HCC)    failed cardioversion x 3 12/2013 at [facility]   Breast cancer [facility])    CHF (congestive heart failure) (HCC)    Chronic systolic CHF (congestive heart failure) (HCC)    CKD (chronic kidney disease) stage 2, GFR 60-89 ml/min    Diabetes mellitus, type II (HCC)    Gallstone    Hearing impairment    Hx of joint problems    Hypertension    Major depressive disorder    Osteoarthritis    Osteoporosis    Pelvic relaxation    Rheumatoid arthritis (HCC)    Stroke (HCC)    fully resolved TIA   Transient cerebral ischemic attack    Vaginal pessary present    Vitamin D  insufficiency     Home Medications Prior to Admission medications   Medication Sig Start Date End Date Taking? Authorizing Provider  apixaban   (ELIQUIS ) 5 MG TABS tablet Take 1 tablet (5 mg total) by mouth 2 (two) times daily. 03/15/13   Wenona Medford SAILOR, MD  bisoprolol  (ZEBETA ) 5 MG tablet Take 2.5 mg by mouth daily. 05/03/20   [provider]  blood glucose meter kit and supplies KIT Dispense based on patient and insurance preference. Use up to four times daily as directed. 05/30/21   Samtani, Jai-Gurmukh, MD  blood glucose meter kit and supplies KIT Dispense based on patient and insurance preference. Use up to four times daily as directed. 05/30/21   Samtani, Jai-Gurmukh, MD  diltiazem  (CARDIZEM  SR) 60 MG 12 hr capsule Take 1 capsule (60 mg total) by mouth every evening. 04/27/21   Sebastian Toribio GAILS, MD  DULoxetine  (CYMBALTA ) 30 MG capsule Take 30 mg by mouth at bedtime.    [provider]  glucose blood (FREESTYLE TEST STRIPS) test strip Please give generic test strips 60 for 1 month supply to be tested x 2 per day Patient not taking: Reported on 09/24/2022 05/30/21   Samtani, Jai-Gurmukh, MD  Lancets (FREESTYLE) lancets Please give generic lancets 60 for 1 month supply to be tested x 2 per day Patient not taking: Reported on 08/04/2022 05/30/21   Samtani, Jai-Gurmukh, MD  lidocaine  (LIDODERM ) 5 %  Place 1 patch onto the skin daily. Remove & Discard patch within 12 hours or as directed by MD 03/23/23   Curatolo, Adam, DO  melatonin 5 MG TABS Take 5 mg by mouth at bedtime.    [provider]  spironolactone  (ALDACTONE ) 25 MG tablet Take 25 mg by mouth daily. 10/13/21   [provider]      Allergies    Patient has no known allergies.    Review of Systems   Review of Systems  Physical Exam Updated Vital Signs BP 121/83   Pulse 64   Temp 98.6 F (37 C) (Oral)   Resp 19   SpO2 94%  Physical Exam Vitals and nursing note reviewed.  Constitutional:      General: She is not in acute distress.    Appearance: Normal appearance. She is well-developed. She is not ill-appearing or diaphoretic.  HENT:      Head: Normocephalic and atraumatic.  Eyes:     General: No visual field deficit.    Extraocular Movements: Extraocular movements intact.     Conjunctiva/sclera: Conjunctivae normal.     Pupils: Pupils are equal, round, and reactive to light.  Cardiovascular:     Rate and Rhythm: Normal rate and regular rhythm.     Pulses: Normal pulses.     Heart sounds: Normal heart sounds. No murmur heard.    No friction rub. No gallop.  Pulmonary:     Effort: Pulmonary effort is normal. No respiratory distress.     Breath sounds: Normal breath sounds. No wheezing or rales.  Abdominal:     General: There is no distension.     Palpations: Abdomen is soft.     Tenderness: There is no abdominal tenderness. There is no guarding.  Musculoskeletal:        General: No swelling or tenderness.     Cervical back: Normal range of motion.  Skin:    General: Skin is warm and dry.     Findings: No erythema or rash.  Neurological:     General: No focal deficit present.     Mental Status: She is alert and oriented to person, place, and time.     GCS: GCS eye subscore is 4. GCS verbal subscore is 5. GCS motor subscore is 6.     Cranial Nerves: No cranial nerve deficit, dysarthria or facial asymmetry.     Sensory: No sensory deficit.     Motor: No weakness or tremor.     Coordination: Coordination normal. Finger-Nose-Finger Test normal.     Gait: Gait normal.     ED Results / Procedures / Treatments   Labs (all labs ordered are listed, but only abnormal results are displayed) Labs Reviewed  CBC WITH DIFFERENTIAL/PLATELET - Abnormal; Notable for the following components:      Result Value   Hemoglobin 16.5 (*)    HCT 48.8 (*)    All other components within normal limits  COMPREHENSIVE METABOLIC PANEL - Abnormal; Notable for the following components:   Glucose, Bld 134 (*)    BUN 26 (*)    Total Bilirubin 2.2 (*)    All other components within normal limits    EKG EKG  Interpretation Date/Time:  Saturday July 30 2023 18:33:32 EST Ventricular Rate:  73 PR Interval:    QRS Duration:  138 QT Interval:  413 QTC Calculation: 456 R Axis:   -84  Text Interpretation: Atrial fibrillation Right bundle branch block Inferior infarct, old Probable anterior infarct, age  indeterminate since last tracing no significant change Confirmed by Lenor Hollering [phone]) on 07/31/2023 11:23:02 AM  Radiology CT ANGIO HEAD NECK W WO CM Result Date: 07/30/2023 CLINICAL DATA:  Initial evaluation for acute left-sided headache, fall. EXAM: CT ANGIOGRAPHY HEAD AND NECK WITH AND WITHOUT CONTRAST TECHNIQUE: Multidetector CT imaging of the head and neck was performed using the standard protocol during bolus administration of intravenous contrast. Multiplanar CT image reconstructions and MIPs were obtained to evaluate the vascular anatomy. Carotid stenosis measurements (when applicable) are obtained utilizing NASCET criteria, using the distal internal carotid diameter as the denominator. RADIATION DOSE REDUCTION: This exam was performed according to the departmental dose-optimization program which includes automated exposure control, adjustment of the mA and/or kV according to patient size and/or use of iterative reconstruction technique. CONTRAST:  75mL OMNIPAQUE  IOHEXOL  350 MG/ML SOLN COMPARISON:  CT from 09/29/2022 FINDINGS: CT HEAD FINDINGS Brain: Age-related cerebral atrophy with chronic microvascular ischemic disease. Chronic right PCA distribution infarct. No acute intracranial hemorrhage. No acute large vessel territory infarct. 1.3 cm hyperdense lesion overlying the left frontal convexity, likely a meningioma, similar to prior. No associated mass effect. No midline shift or hydrocephalus. No extra-axial fluid collection. Vascular: No abnormal hyperdense vessel. Scattered vascular calcifications noted within the carotid siphons. Skull: Scalp soft tissues demonstrate no acute finding. Calvarium  intact. Sinuses/Orbits: Globes orbital soft tissues within normal limits. Paranasal sinuses are clear. No mastoid effusion. Other: None. Review of the MIP images confirms the above findings CTA NECK FINDINGS Aortic arch: Standard branching. Imaged portion shows no evidence of aneurysm or dissection. No significant stenosis of the major arch vessel origins. Right carotid system: No evidence of dissection, stenosis (50% or greater), or occlusion. Left carotid system: No evidence of dissection, stenosis (50% or greater), or occlusion. Vertebral arteries: No evidence of dissection, stenosis (50% or greater), or occlusion. Skeleton: No discrete or worrisome osseous lesions. Other neck: No other acute finding. Upper chest: No other acute finding. Review of the MIP images confirms the above findings CTA HEAD FINDINGS Anterior circulation: Mild atheromatous change about the carotid siphons without hemodynamically significant stenosis. A1 segments patent bilaterally. Normal anterior communicating artery complex. Anterior cerebral arteries patent without stenosis. No M1 stenosis or occlusion. Distal MCA branches perfused and symmetric. Posterior circulation: Both V4 segments patent without stenosis. Left vertebral artery dominant. Right PICA patent. Left PICA not well seen. Basilar patent without stenosis. Superior cerebellar and posterior cerebral arteries widely patent bilaterally. Venous sinuses: Patent allowing for timing the contrast bolus. Anatomic variants: None significant. No intracranial aneurysm or other vascular malformation. Review of the MIP images confirms the above findings IMPRESSION: CT HEAD: 1. No acute intracranial abnormality. 2. Age-related cerebral atrophy with chronic microvascular ischemic disease, with a chronic right PCA distribution infarct. 3. 1.3 cm hyperdense lesion overlying the left frontal convexity, likely a meningioma, similar to prior. No associated mass effect. CTA HEAD AND NECK: 1.  Negative CTA of the head and neck. No large vessel occlusion or other virgin finding. No intracranial aneurysm. 2. Mild for age atheromatous disease without hemodynamically significant or correctable stenosis Electronically Signed   By: Morene Hoard M.D.   On: 07/30/2023 19:27   CT C-SPINE NO CHARGE Result Date: 07/30/2023 CLINICAL DATA:  Fall EXAM: CT CERVICAL SPINE WITHOUT CONTRAST TECHNIQUE: Multidetector CT imaging of the cervical spine was performed without intravenous contrast. Multiplanar CT image reconstructions were also generated. RADIATION DOSE REDUCTION: This exam was performed according to the departmental dose-optimization program which includes automated exposure control,  adjustment of the mA and/or kV according to patient size and/or use of iterative reconstruction technique. COMPARISON:  09/18/2017 FINDINGS: Alignment: Normal Skull base and vertebrae: No acute fracture. No primary bone lesion or focal pathologic process. Soft tissues and spinal canal: No prevertebral fluid or swelling. No visible canal hematoma. Disc levels: Degenerative disc disease in the lower cervical spine with disc space narrowing and spurring. Moderate bilateral degenerative facet disease. Upper chest: No acute findings Other: Chest wall soft tissues are unremarkable. IMPRESSION: Degenerative disc and facet disease.  No acute bony abnormality. Electronically Signed   By: Franky Crease M.D.   On: 07/30/2023 19:16    Procedures Procedures    Medications Ordered in ED Medications  iohexol  (OMNIPAQUE ) 350 MG/ML injection 75 mL (75 mLs Intravenous Contrast Given 07/30/23 1855)    ED Course/ Medical Decision Making/ A&P                                   76 female with a history of CHF, atrial fibrillation on Eliquis  5 mg twice daily, CKD, type 2 diabetes, hyperlipidemia, rheumatoid arthritis, CVA/TIA who presents with concern for sudden headache and fall.  DDx includes SAH, IPH, traumatic SDH from fall,  TIA, CVA, electrolyte abnormality, meningitis.  No fever, normal mental status, doubt meningitis.  No focal neurologic symptoms on history or findings on exam and doubt TIA/CVA. History is less consistent with CVT and lower suspicion for this with her taking eliquis .   CTA head/neck/cspine ordered given concern for ?SAH in setting of sudden onset severe headache as well as fall on anticoagulation, concern for head/neck trauma.  CT completed and evaluated by me and radiology shows no evidence of ICH, no sign of aneurysm or vascular abnormality.  Given she is on eliquis  and had CT completed within 6 hours of symptom onset have low suspicion for occult SAH, and feel risks of LP on eliquis  would outweigh benefit.  She continues to feel improved.  CT does show likely meningioma that is stable, unclear if this may have contributed to symptoms today. Recommend outpatient Neurology follow up. Patient discharged in stable condition with understanding of reasons to return.         Final Clinical Impression(s) / ED Diagnoses Final diagnoses:  Acute nonintractable headache, unspecified headache type    Rx / DC Orders ED Discharge Orders          Ordered    Ambulatory referral to Neurology       Comments: An appointment is requested in approximately: 2 weeks   07/30/23 2128              Dreama Longs, MD 08/01/23 [phone]

## 2023-07-30 NOTE — ED Notes (Signed)
 She has a mild, persistent left-sided cerebellar h/a.

## 2023-07-30 NOTE — ED Notes (Signed)
She returns from CT at this time. 

## 2023-07-30 NOTE — ED Notes (Signed)
 At this time, she is taken with con't. Ecg monitoring to CT by our C.N., who stays with her.
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
## 2023-07-30 NOTE — ED Provider Notes (Signed)
 Will bypass labs for CTA. Discussed with daughter at bedside.    Natalie Currier, MD 07/30/23 [phone]
